# Patient Record
Sex: Female | Born: 1995 | Race: White | Hispanic: No | Marital: Single | Smoking: Former smoker
Health system: Southern US, Community
[De-identification: ages and names within clinical notes are randomized; demographics above are authoritative.]

## PROBLEM LIST (undated history)

## (undated) ENCOUNTER — Ambulatory Visit: Admission: EM | Payer: BC Managed Care – PPO

## (undated) DIAGNOSIS — R45851 Suicidal ideations: Secondary | ICD-10-CM

## (undated) DIAGNOSIS — F4010 Social phobia, unspecified: Secondary | ICD-10-CM

## (undated) DIAGNOSIS — F909 Attention-deficit hyperactivity disorder, unspecified type: Secondary | ICD-10-CM

## (undated) DIAGNOSIS — F329 Major depressive disorder, single episode, unspecified: Secondary | ICD-10-CM

## (undated) DIAGNOSIS — F99 Mental disorder, not otherwise specified: Secondary | ICD-10-CM

## (undated) DIAGNOSIS — F32A Depression, unspecified: Secondary | ICD-10-CM

## (undated) DIAGNOSIS — E66811 Obesity, class 1: Secondary | ICD-10-CM

## (undated) DIAGNOSIS — F419 Anxiety disorder, unspecified: Secondary | ICD-10-CM

## (undated) DIAGNOSIS — F331 Major depressive disorder, recurrent, moderate: Secondary | ICD-10-CM

## (undated) DIAGNOSIS — Z720 Tobacco use: Secondary | ICD-10-CM

---

## 2015-01-26 ENCOUNTER — Emergency Department (HOSPITAL_COMMUNITY)
Admission: EM | Admit: 2015-01-26 | Discharge: 2015-01-26 | Disposition: A | Discharge status: Home / Self Care | Payer: BLUE CROSS/BLUE SHIELD | Source: Home / Self Care | Attending: Family Medicine | Admitting: Family Medicine

## 2015-01-26 ENCOUNTER — Emergency Department (INDEPENDENT_AMBULATORY_CARE_PROVIDER_SITE_OTHER): Payer: BLUE CROSS/BLUE SHIELD

## 2015-01-26 ENCOUNTER — Encounter (HOSPITAL_COMMUNITY): Payer: Self-pay | Admitting: Emergency Medicine

## 2015-01-26 DIAGNOSIS — S93402A Sprain of unspecified ligament of left ankle, initial encounter: Secondary | ICD-10-CM | POA: Diagnosis not present

## 2015-01-26 NOTE — Discharge Instructions (Signed)

## 2015-01-26 NOTE — ED Notes (Signed)
Reports rolling left ankle last night while horse playing.  "states I heard 3 pops".  Unable to bear weight.  Bruising and swelling noted.
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
## 2015-08-20 ENCOUNTER — Emergency Department (HOSPITAL_COMMUNITY)
Admission: EM | Admit: 2015-08-20 | Discharge: 2015-08-20 | Disposition: A | Discharge status: Home / Self Care | Payer: BLUE CROSS/BLUE SHIELD | Attending: Emergency Medicine | Admitting: Emergency Medicine

## 2015-08-20 ENCOUNTER — Encounter (HOSPITAL_COMMUNITY): Payer: Self-pay

## 2015-08-20 DIAGNOSIS — F17210 Nicotine dependence, cigarettes, uncomplicated: Secondary | ICD-10-CM | POA: Insufficient documentation

## 2015-08-20 DIAGNOSIS — Y9389 Activity, other specified: Secondary | ICD-10-CM | POA: Diagnosis not present

## 2015-08-20 DIAGNOSIS — Z79899 Other long term (current) drug therapy: Secondary | ICD-10-CM | POA: Insufficient documentation

## 2015-08-20 DIAGNOSIS — W260XXA Contact with knife, initial encounter: Secondary | ICD-10-CM | POA: Insufficient documentation

## 2015-08-20 DIAGNOSIS — Y998 Other external cause status: Secondary | ICD-10-CM | POA: Insufficient documentation

## 2015-08-20 DIAGNOSIS — S61412A Laceration without foreign body of left hand, initial encounter: Secondary | ICD-10-CM | POA: Insufficient documentation

## 2015-08-20 DIAGNOSIS — Y9289 Other specified places as the place of occurrence of the external cause: Secondary | ICD-10-CM | POA: Diagnosis not present

## 2015-08-20 MED ORDER — BACITRACIN ZINC 500 UNIT/GM EX OINT: 1.0000 "application " | TOPICAL_OINTMENT | Freq: Two times a day (BID) | CUTANEOUS | Status: DC

## 2015-08-20 MED ORDER — LIDOCAINE-EPINEPHRINE (PF) 2 %-1:200000 IJ SOLN: 10.0000 mL | Freq: Once | INTRAMUSCULAR | Status: DC

## 2015-08-20 MED ORDER — IBUPROFEN 600 MG PO TABS: 600.0000 mg | ORAL_TABLET | Freq: Four times a day (QID) | ORAL | Status: DC | PRN
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
## 2015-08-20 NOTE — Discharge Instructions (Signed)

## 2015-08-20 NOTE — ED Notes (Signed)
Pt had a laceration to the palm of her right hand, bleeding controlled at this time

## 2015-10-15 DIAGNOSIS — F900 Attention-deficit hyperactivity disorder, predominantly inattentive type: Secondary | ICD-10-CM

## 2015-11-18 ENCOUNTER — Encounter (HOSPITAL_COMMUNITY): Payer: Self-pay | Admitting: Emergency Medicine

## 2015-11-18 ENCOUNTER — Emergency Department (HOSPITAL_COMMUNITY)
Admission: EM | Admit: 2015-11-18 | Discharge: 2015-11-18 | Payer: BLUE CROSS/BLUE SHIELD | Attending: Emergency Medicine | Admitting: Emergency Medicine

## 2015-11-18 ENCOUNTER — Inpatient Hospital Stay
Admission: EM | Admit: 2015-11-18 | Discharge: 2015-11-21 | DRG: 885 | Disposition: A | Discharge status: Home / Self Care | Payer: BLUE CROSS/BLUE SHIELD | Source: Intra-hospital | Attending: Psychiatry | Admitting: Psychiatry

## 2015-11-18 DIAGNOSIS — F17210 Nicotine dependence, cigarettes, uncomplicated: Secondary | ICD-10-CM | POA: Diagnosis present

## 2015-11-18 DIAGNOSIS — E538 Deficiency of other specified B group vitamins: Secondary | ICD-10-CM

## 2015-11-18 DIAGNOSIS — F322 Major depressive disorder, single episode, severe without psychotic features: Secondary | ICD-10-CM

## 2015-11-18 DIAGNOSIS — Z9109 Other allergy status, other than to drugs and biological substances: Secondary | ICD-10-CM | POA: Diagnosis not present

## 2015-11-18 DIAGNOSIS — Y906 Blood alcohol level of 120-199 mg/100 ml: Secondary | ICD-10-CM | POA: Diagnosis present

## 2015-11-18 DIAGNOSIS — F410 Panic disorder [episodic paroxysmal anxiety] without agoraphobia: Secondary | ICD-10-CM | POA: Diagnosis present

## 2015-11-18 DIAGNOSIS — R457 State of emotional shock and stress, unspecified: Secondary | ICD-10-CM | POA: Diagnosis not present

## 2015-11-18 DIAGNOSIS — Z818 Family history of other mental and behavioral disorders: Secondary | ICD-10-CM | POA: Diagnosis not present

## 2015-11-18 DIAGNOSIS — F4010 Social phobia, unspecified: Secondary | ICD-10-CM

## 2015-11-18 DIAGNOSIS — F1010 Alcohol abuse, uncomplicated: Secondary | ICD-10-CM | POA: Diagnosis present

## 2015-11-18 DIAGNOSIS — Z79899 Other long term (current) drug therapy: Secondary | ICD-10-CM | POA: Insufficient documentation

## 2015-11-18 DIAGNOSIS — Z0289 Encounter for other administrative examinations: Secondary | ICD-10-CM | POA: Insufficient documentation

## 2015-11-18 DIAGNOSIS — Z008 Encounter for other general examination: Secondary | ICD-10-CM

## 2015-11-18 DIAGNOSIS — F1020 Alcohol dependence, uncomplicated: Secondary | ICD-10-CM

## 2015-11-18 DIAGNOSIS — F909 Attention-deficit hyperactivity disorder, unspecified type: Secondary | ICD-10-CM | POA: Diagnosis not present

## 2015-11-18 DIAGNOSIS — F17200 Nicotine dependence, unspecified, uncomplicated: Secondary | ICD-10-CM

## 2015-11-18 DIAGNOSIS — G4700 Insomnia, unspecified: Secondary | ICD-10-CM | POA: Diagnosis present

## 2015-11-18 DIAGNOSIS — Z046 Encounter for general psychiatric examination, requested by authority: Secondary | ICD-10-CM | POA: Diagnosis present

## 2015-11-18 DIAGNOSIS — F1510 Other stimulant abuse, uncomplicated: Secondary | ICD-10-CM | POA: Insufficient documentation

## 2015-11-18 DIAGNOSIS — F331 Major depressive disorder, recurrent, moderate: Principal | ICD-10-CM

## 2015-11-18 DIAGNOSIS — F900 Attention-deficit hyperactivity disorder, predominantly inattentive type: Secondary | ICD-10-CM

## 2015-11-18 HISTORY — DX: Depression, unspecified: F32.A

## 2015-11-18 HISTORY — DX: Attention-deficit hyperactivity disorder, unspecified type: F90.9

## 2015-11-18 HISTORY — DX: Anxiety disorder, unspecified: F41.9

## 2015-11-18 HISTORY — DX: Major depressive disorder, single episode, unspecified: F32.9

## 2015-11-18 LAB — RAPID URINE DRUG SCREEN, HOSP PERFORMED
Amphetamines: POSITIVE — AB
Barbiturates: NOT DETECTED
Benzodiazepines: NOT DETECTED
Cocaine: NOT DETECTED
Opiates: NOT DETECTED
Tetrahydrocannabinol: NOT DETECTED

## 2015-11-18 LAB — COMPREHENSIVE METABOLIC PANEL
ALT: 19 U/L (ref 14–54)
AST: 28 U/L (ref 15–41)
Albumin: 4.9 g/dL (ref 3.5–5.0)
Alkaline Phosphatase: 54 U/L (ref 38–126)
Anion gap: 13 (ref 5–15)
BUN: 13 mg/dL (ref 6–20)
CO2: 22 mmol/L (ref 22–32)
Calcium: 9.3 mg/dL (ref 8.9–10.3)
Chloride: 108 mmol/L (ref 101–111)
Creatinine, Ser: 0.91 mg/dL (ref 0.44–1.00)
GFR calc Af Amer: >60 mL/min (ref >60)
GFR calc non Af Amer: >60 mL/min (ref >60)
Glucose, Bld: 82 mg/dL (ref 65–99)
Potassium: 3.9 mmol/L (ref 3.5–5.1)
Sodium: 143 mmol/L (ref 135–145)
Total Bilirubin: 0.3 mg/dL (ref 0.3–1.2)
Total Protein: 8 g/dL (ref 6.5–8.1)

## 2015-11-18 LAB — CBC
HCT: 46.1 % — ABNORMAL HIGH (ref 36.0–46.0)
Hemoglobin: 15.3 g/dL — ABNORMAL HIGH (ref 12.0–15.0)
MCH: 29.9 pg (ref 26.0–34.0)
MCHC: 33.2 g/dL (ref 30.0–36.0)
MCV: 90 fL (ref 78.0–100.0)
Platelets: 351 10*3/uL (ref 150–400)
RBC: 5.12 MIL/uL — ABNORMAL HIGH (ref 3.87–5.11)
RDW: 14.1 % (ref 11.5–15.5)
WBC: 7.9 10*3/uL (ref 4.0–10.5)

## 2015-11-18 LAB — ACETAMINOPHEN LEVEL: Acetaminophen (Tylenol), Serum: <10 ug/mL — ABNORMAL LOW (ref 10–30)

## 2015-11-18 LAB — PREGNANCY, URINE: Preg Test, Ur: NEGATIVE

## 2015-11-18 LAB — SALICYLATE LEVEL: Salicylate Lvl: <4 mg/dL (ref 2.8–30.0)

## 2015-11-18 LAB — ETHANOL: Alcohol, Ethyl (B): 130 mg/dL — ABNORMAL HIGH (ref <5)

## 2015-11-18 MED ORDER — NICOTINE 21 MG/24HR TD PT24: 21.0000 mg | MEDICATED_PATCH | Freq: Every day | TRANSDERMAL | Status: DC | PRN

## 2015-11-18 MED ORDER — ONDANSETRON HCL 4 MG PO TABS: 4.0000 mg | ORAL_TABLET | Freq: Three times a day (TID) | ORAL | Status: DC | PRN

## 2015-11-18 MED ORDER — ZOLPIDEM TARTRATE 5 MG PO TABS: 5.0000 mg | ORAL_TABLET | Freq: Every evening | ORAL | Status: DC | PRN

## 2015-11-18 MED ORDER — HYDROXYZINE HCL 25 MG PO TABS: 25.0000 mg | ORAL_TABLET | Freq: Four times a day (QID) | ORAL | Status: DC | PRN

## 2015-11-18 MED ORDER — ACETAMINOPHEN 325 MG PO TABS
650.0000 mg | ORAL_TABLET | Freq: Four times a day (QID) | ORAL | Status: DC | PRN
  Administered 2015-11-20 (×2): 650 mg via ORAL
  Filled 2015-11-18 (×2): qty 2

## 2015-11-18 MED ORDER — TRAZODONE HCL 100 MG PO TABS: 100.0000 mg | ORAL_TABLET | Freq: Every evening | ORAL | Status: DC | PRN

## 2015-11-18 MED ORDER — LOPERAMIDE HCL 2 MG PO CAPS: 2.0000 mg | ORAL_CAPSULE | ORAL | Status: DC | PRN

## 2015-11-18 MED ORDER — THIAMINE HCL 100 MG/ML IJ SOLN: 100.0000 mg | Freq: Once | INTRAMUSCULAR | Status: DC

## 2015-11-18 MED ORDER — HYDROXYZINE HCL 25 MG PO TABS
25.0000 mg | ORAL_TABLET | Freq: Three times a day (TID) | ORAL | Status: DC | PRN
  Administered 2015-11-18 (×2): 25 mg via ORAL
  Filled 2015-11-18 (×2): qty 1

## 2015-11-18 MED ORDER — ADULT MULTIVITAMIN W/MINERALS CH
1.0000 | ORAL_TABLET | Freq: Every day | ORAL | Status: DC
  Administered 2015-11-18: 1 via ORAL
  Filled 2015-11-18: qty 1

## 2015-11-18 MED ORDER — IBUPROFEN 200 MG PO TABS: 600.0000 mg | ORAL_TABLET | Freq: Three times a day (TID) | ORAL | Status: DC | PRN

## 2015-11-18 MED ORDER — ONDANSETRON 4 MG PO TBDP: 4.0000 mg | ORAL_TABLET | Freq: Four times a day (QID) | ORAL | Status: DC | PRN

## 2015-11-18 MED ORDER — LORAZEPAM 1 MG PO TABS: 1.0000 mg | ORAL_TABLET | Freq: Four times a day (QID) | ORAL | Status: DC | PRN

## 2015-11-18 MED ORDER — ALUM & MAG HYDROXIDE-SIMETH 200-200-20 MG/5ML PO SUSP: 30.0000 mL | ORAL | Status: DC | PRN

## 2015-11-18 MED ORDER — TRAZODONE HCL 50 MG PO TABS: 50.0000 mg | ORAL_TABLET | Freq: Every day | ORAL | Status: DC

## 2015-11-18 MED ORDER — LORAZEPAM 1 MG PO TABS: 0.0000 mg | ORAL_TABLET | Freq: Two times a day (BID) | ORAL | Status: DC

## 2015-11-18 MED ORDER — MAGNESIUM HYDROXIDE 400 MG/5ML PO SUSP: 30.0000 mL | Freq: Every day | ORAL | Status: DC | PRN

## 2015-11-18 MED ORDER — VITAMIN B-1 100 MG PO TABS: 100.0000 mg | ORAL_TABLET | Freq: Every day | ORAL | Status: DC

## 2015-11-18 MED ORDER — LORAZEPAM 1 MG PO TABS: 1.0000 mg | ORAL_TABLET | Freq: Two times a day (BID) | ORAL | Status: DC

## 2015-11-18 MED ORDER — LORAZEPAM 1 MG PO TABS: 1.0000 mg | ORAL_TABLET | Freq: Every day | ORAL | Status: DC

## 2015-11-18 MED ORDER — ACETAMINOPHEN 325 MG PO TABS: 650.0000 mg | ORAL_TABLET | ORAL | Status: DC | PRN

## 2015-11-18 MED ORDER — LORAZEPAM 1 MG PO TABS: 0.0000 mg | ORAL_TABLET | Freq: Four times a day (QID) | ORAL | Status: DC

## 2015-11-18 MED ORDER — LORAZEPAM 1 MG PO TABS: 1.0000 mg | ORAL_TABLET | Freq: Three times a day (TID) | ORAL | Status: DC

## 2015-11-18 MED ORDER — LORAZEPAM 1 MG PO TABS
1.0000 mg | ORAL_TABLET | Freq: Three times a day (TID) | ORAL | Status: DC | PRN
  Administered 2015-11-18: 1 mg via ORAL
  Filled 2015-11-18: qty 1

## 2015-11-18 MED ORDER — LORAZEPAM 1 MG PO TABS: 1.0000 mg | ORAL_TABLET | Freq: Four times a day (QID) | ORAL | Status: DC

## 2015-11-18 NOTE — ED Notes (Signed)
Pt has in belonging bag:  Purple hoodie zip up sweater, brown hair band, black pants, black bra, white socks, white shirt, black undershirt, black sweater, brown boots, white phone, (1) ten dollar bill.

## 2015-11-18 NOTE — Progress Notes (Signed)
D:  Patient is a 20 year-old female admitted to [facility] ambulatory without difficulty.  Patient is alert and oriented upon admission.  Patient is tearful on admission interview and reports being very anxious. A:  Admission assessment completed without difficulty.  Skin and contraband assessment completed with no skin abnormalities nor contraband found.  Q.15 minute safety checks were implemented at the time of admission.  Patient was oriented to the unit and escorted to room #324-B. R:  Patient was receptive to and cooperative with admission assessment.  Patient contracts for safety on the unit at this time

## 2015-11-18 NOTE — BH Assessment (Signed)
[facility] Assessment Progress Note  Per Thedore MinsMojeed Akintayo, MD, this pt requires psychiatric hospitalization at this time.  Berneice Heinrichina Tate, RN, [facility] calls to report that pt has been accepted to [facility], Rm 311, by Dr Ardyth HarpsHernandez.  Dahlia ByesJosephine Onuoha, NP, concurs with this decision.  Pt has signed Voluntary Admission and Consent for Treatment, as well as Consent to Release Information to no one, and signed [facility].  Pt's nurse, Morrie Sheldonshley, has been notified, and agrees to send original paperwork along with pt via [facility], and to call report to [phone].  Doylene Canninghomas Walaa Carel, MA Triage Specialist [phone]
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
## 2015-11-18 NOTE — [facility] Group Notes (Signed)
[facility] LCSW Group Therapy   11/18/2015 1pm  Type of Therapy: Group Therapy   Participation Level: Did Not Attend. Patient invited to participate but declined.    Vernice Bowker F. Ashea Winiarski, MSW, LCSWA, LCAS   
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
## 2015-11-18 NOTE — ED Notes (Signed)
[facility] at facility to transfer pt to [facility]. Pt signed for personal belongings. Personal belongings given to [facility] for [facility]. Pt signed e-signature. Ambulatory out of facility.

## 2015-11-18 NOTE — Progress Notes (Signed)
Entered in d/c instructions Nicolasa DuckingGarlick, William Schedule an appointment as soon as possible for a visit As needed [address] [place] [id number] [phone]

## 2015-11-18 NOTE — ED Notes (Signed)
Patient tearful, states she had a hard day based on "some difficult days that are coming up". Patient states she does not really want to stay but will because she does not want to be involuntarily committed. Patient denies SI. Patient reports she has a psychiatrist named Alexia Freestoneatty that she "sometimes sees".
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
## 2015-11-18 NOTE — ED Notes (Signed)
Pt requesting to speak with MD. Pt boyfriend at bedside. MD notified. Will continue to monitor.
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
## 2015-11-18 NOTE — Progress Notes (Signed)
Pt states pcp is Dr Heriberto AntiguaGarlick EPIC updated

## 2015-11-19 DIAGNOSIS — F331 Major depressive disorder, recurrent, moderate: Secondary | ICD-10-CM

## 2015-11-19 DIAGNOSIS — F1010 Alcohol abuse, uncomplicated: Secondary | ICD-10-CM

## 2015-11-19 DIAGNOSIS — F909 Attention-deficit hyperactivity disorder, unspecified type: Secondary | ICD-10-CM

## 2015-11-19 DIAGNOSIS — F4010 Social phobia, unspecified: Secondary | ICD-10-CM

## 2015-11-19 DIAGNOSIS — F17200 Nicotine dependence, unspecified, uncomplicated: Secondary | ICD-10-CM

## 2015-11-19 LAB — TSH: TSH: 1.054 u[IU]/mL (ref 0.350–4.500)

## 2015-11-19 MED ORDER — NICOTINE 14 MG/24HR TD PT24
14.0000 mg | MEDICATED_PATCH | Freq: Every day | TRANSDERMAL | Status: DC | PRN
  Administered 2015-11-19 – 2015-11-20 (×2): 14 mg via TRANSDERMAL
  Filled 2015-11-19 (×2): qty 1

## 2015-11-19 MED ORDER — FLUOXETINE HCL 10 MG PO CAPS
10.0000 mg | ORAL_CAPSULE | Freq: Every day | ORAL | Status: DC
  Administered 2015-11-19 – 2015-11-21 (×3): 10 mg via ORAL
  Filled 2015-11-19 (×3): qty 1

## 2015-11-19 MED ORDER — CLONAZEPAM 0.5 MG PO TABS
0.2500 mg | ORAL_TABLET | Freq: Three times a day (TID) | ORAL | Status: DC
  Administered 2015-11-19 – 2015-11-21 (×6): 0.25 mg via ORAL
  Filled 2015-11-19 (×6): qty 1

## 2015-11-19 MED ORDER — HYDROXYZINE HCL 25 MG PO TABS
25.0000 mg | ORAL_TABLET | Freq: Every evening | ORAL | Status: DC | PRN
  Administered 2015-11-19 – 2015-11-20 (×3): 25 mg via ORAL
  Filled 2015-11-19 (×3): qty 1

## 2015-11-19 NOTE — [facility] Group Notes (Signed)
[facility] LCSW Group Therapy   11/19/2015 1:15 PM   Type of Therapy: Group Therapy   Participation Level: Active   Participation Quality: Attentive, Sharing and Supportive   Affect: Depressed and Flat   Cognitive: Alert and Oriented   Insight: Developing/Improving and Engaged   Engagement in Therapy: Developing/Improving and Engaged   Modes of Intervention: Clarification, Confrontation, Discussion, Education, Exploration, Limit-setting, Orientation, Problem-solving, Rapport Building, Dance movement psychotherapisteality Testing, Socialization and Support   Summary of Progress/Problems: The topic for group today was emotional regulation. This group focused on both positive and negative emotion identification and allowed group members to process ways to identify feelings, regulate negative emotions, and find healthy ways to manage internal/external emotions. Group members were asked to reflect on a time when their reaction to an emotion led to a negative outcome and explored how alternative responses using emotion regulation would have benefited them. Group members were also asked to discuss a time when emotion regulation was utilized when a negative emotion was experienced. Pt shared she once reacted to an unpleasant incident by demonstrating behavior that was disturbing.  Pt was a vague historian.  Pt shared that she identified that this was a negative outcome that resulted from a negative emotion.  Pt shared she would, in the future, seek out therapeutic relationships determine her next course of action after experiencing a negative emotion like depression.  Pt was polite and cooperative with the CSW and other group members and focused and attentive to the topics discussed and the sharing of others.      Dorothe PeaJonathan F. Lia Vigilante, MSW, LCSWA, LCAS

## 2015-11-19 NOTE — [facility] Group Notes (Signed)
[facility] Group Notes:  (Nursing/MHT/Case Management/Adjunct)  Date:  11/19/2015  Time:  10:38 PM  Type of Therapy:  Group Therapy  Participation Level:  Active  Participation Quality:  Appropriate  Affect:  Appropriate  Cognitive:  Appropriate  Insight:  Appropriate  Engagement in Group:  Engaged  Modes of Intervention:  n/a  Summary of Progress/Problems:  Renee Mcdowell Renee Mcdowell 11/19/2015, 10:38 PM

## 2015-11-19 NOTE — Progress Notes (Signed)
Pt pleasant and cooperative with care. Denies SI, HI, AVH. No negative behaviors. Visible in mileu. Interacts appropriately with staff and peers.  Encouragement and support offered. Pt receptive and remains safe on unit.  Will continue to assess and monitor for q 15 min checks

## 2015-11-19 NOTE — Progress Notes (Signed)
D: Pt is pleasant and cooperative this evening. Denies SI/HI/AVH at this time. Denies pain. Pt c/o anxiety which she rates an 8 out of 10, due to "being here." Pt requests PRN medication.  A: Emotional support and encouragement provided. Medications administered with education. q15 minute safety checks maintained. R: Pt remains free from harm. Will continue to monitor.

## 2015-11-19 NOTE — [facility] Group Notes (Signed)
[facility] Group Notes:  (Nursing/MHT/Case Management/Adjunct)  Date:  11/19/2015  Time:  3:39 PM  Type of Therapy:  Psychoeducational Skills  Participation Level:  Did Not Attend   Lynelle SmokeCara [facility] LPMadoni 11/19/2015, 3:39 PM
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
## 2015-11-19 NOTE — Plan of Care (Signed)
Problem: Ineffective individual coping Goal: STG: Patient will remain free from self harm Outcome: Progressing Pt remains free from harm  Problem: Alteration in mood Goal: LTG-Patient reports reduction in suicidal thoughts (Patient reports reduction in suicidal thoughts and is able to verbalize a safety plan for whenever patient is feeling suicidal)  Outcome: Progressing Pt denies SI at this time     

## 2015-11-19 NOTE — Progress Notes (Signed)
Recreation Therapy Notes  Date: 03.15.17 Time: 3:00 pm Location: Craft Room  Group Topic: Self-esteem  Goal Area(s) Addresses:  Patient will write at least one positive trait. Patient will verbalize benefit of having a healthy self-esteem.  Behavioral Response: Attentive, Interactive  Intervention: I Am  Activity: Patients were given a worksheet with the letter I on it and instructed to write as many positive traits inside the letter.  Education: LRT educated patients on ways they can increase their self-esteem.  Education Outcome: Acknowledges education/In group clarification offered  Clinical Observations/Feedback: Patient completed activity by writing positive traits down. Patient contributed to group discussion by stating how her self-esteem affects her and why she tends to think more negatively about herself instead of positively.  Jacquelynn CreeGreene,Mikaila Grunert M, LRT/CTRS 11/19/2015 4:51 PM
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
## 2015-11-19 NOTE — Plan of Care (Signed)
Problem: Ineffective individual coping Goal: STG: Patient will remain free from self harm Outcome: Progressing No self harm reported or observed     

## 2015-11-19 NOTE — [facility] Suicide Risk Assessment (Signed)
[facility]   Nursing information obtained from:    Demographic factors:    Current Mental Status:    Loss Factors:    Historical Factors:    Risk Reduction Factors:     Total Time spent with patient: 1 hour Principal Problem: Major depressive disorder, recurrent episode, moderate (HCC) Diagnosis:   Patient Active Problem List   Diagnosis Date Noted  . Social phobia [F40.10] 11/19/2015  . Tobacco use disorder [F17.200] 11/19/2015  . Major depressive disorder, recurrent episode, moderate (HCC) [F33.1] 11/19/2015  . Alcohol use disorder, mild, abuse [F10.10] 11/19/2015  . Attention deficit hyperactivity disorder (ADHD) [F90.9] 11/19/2015   Subjective Data:   Continued Clinical Symptoms:  Alcohol Use Disorder Identification Test Final Score (AUDIT): 4 The "Alcohol Use Disorders Identification Test", Guidelines for Use in Primary Care, Second Edition.  World Science writerHealth Organization [facility](WHO). Score between 0-7:  no or low risk or alcohol related problems. Score between 8-15:  moderate risk of alcohol related problems. Score between 16-19:  high risk of alcohol related problems. Score 20 or above:  warrants further diagnostic evaluation for alcohol dependence and treatment.   CLINICAL FACTORS:       Psychiatric Specialty Exam: ROS   COGNITIVE FEATURES THAT CONTRIBUTE TO RISK:  None    SUICIDE RISK:   Mild:  Suicidal ideation of limited frequency, intensity, duration, and specificity.  There are no identifiable plans, no associated intent, mild dysphoria and related symptoms, good self-control (both objective and subjective assessment), few other risk factors, and identifiable protective factors, including available and accessible social support.  [facility]  I certify that inpatient services furnished can reasonably be expected to improve the patient's condition.   Jimmy FootmanHernandez-Gonzalez,  Brinnley Lacap, MD 11/19/2015, 12:33 PM

## 2015-11-19 NOTE — [facility] Group Notes (Signed)
[facility] LCSW Aftercare Discharge Planning Group Note  11/19/2015 9:30 AM  Participation Quality: Did Not Attend. Patient invited to participate but declined.   Renee Mcdowell, MSW, LCSWA, LCAS   

## 2015-11-20 LAB — VITAMIN B12: Vitamin B-12: 198 pg/mL (ref 180–914)

## 2015-11-20 MED ORDER — CLONAZEPAM 0.5 MG PO TABS: 0.2500 mg | ORAL_TABLET | Freq: Three times a day (TID) | ORAL | Status: DC | PRN

## 2015-11-20 MED ORDER — HYDROXYZINE HCL 25 MG PO TABS: 25.0000 mg | ORAL_TABLET | Freq: Every evening | ORAL | Status: DC | PRN

## 2015-11-20 MED ORDER — FLUOXETINE HCL 10 MG PO CAPS: 10.0000 mg | ORAL_CAPSULE | Freq: Every day | ORAL | Status: DC

## 2015-11-20 NOTE — Progress Notes (Signed)
D: Pt denies SI/HI/AVH. Pt is pleasant and cooperative. Pt visible on unit . Pt stated she was feeling ok at this time, forwards little information, but will answer questions if prompted.   A: Pt was offered support and encouragement. Pt was given scheduled medications. Pt was encourage to attend groups. Q 15 minute checks were done for safety.   R:Pt attends groups and interacts well with peers and staff. Pt is taking medication. Pt has no complaints.Pt receptive to treatment and safety maintained on unit.

## 2015-11-20 NOTE — Progress Notes (Signed)
[facility]  11/20/2015 1:44 PM Deforest Hoyleslyse Fike  MRN:  [id number] Subjective: Patient reports feeling much better this morning. She no longer feels suicidal or overwhelmed with emotions. She feels that her emotions are more under control and she doesn't feel as nervous. She was able to sleep well through the night. She is tolerating medications well. She denies major problems with sleep, appetite energy or concentration today. She denies suicidality, homicidality or having auditory or visual hallucinations. She denies side effects from medications. She denies physical complaints today.  We discussed our concerns about alcohol use and driving all intoxicated. Patient was receptive and acknowledges drinking excessively and that she should not have been driving that night  Per nursing:   D: Patient is alert and oriented on the unit this shift. Patient attended and actively participated in groups today. Patient denies suicidal ideation, homicidal ideation, auditory or visual hallucinations at the present time.  A: Scheduled medications are administered to patient as per MD orders. Emotional support and encouragement are provided. Patient is maintained on q.15 minute safety checks. Patient is informed to notify staff with questions or concerns. R: No adverse medication reactions are noted. Patient is cooperative with medication administration and treatment plan today. Patient is receptive, calm and cooperative on the unit at this time. Patient interacts well with others on the unit this shift. Patient contracts for safety at this time. Patient remains safe at this time.  Principal Problem: Major depressive disorder, recurrent episode, moderate (HCC) Diagnosis:   Patient Active Problem List   Diagnosis Date Noted  . Social phobia [F40.10] 11/19/2015  . Tobacco use disorder [F17.200] 11/19/2015  . Major depressive disorder, recurrent episode, moderate (HCC) [F33.1] 11/19/2015  .  Alcohol use disorder, mild, abuse [F10.10] 11/19/2015  . Attention deficit hyperactivity disorder (ADHD) [F90.9] 11/19/2015   Total Time spent with patient: 30 minutes    Past Medical History:  Past Medical History  Diagnosis Date  . ADHD (attention deficit hyperactivity disorder)   . Depression   . Anxiety    History reviewed. No pertinent past surgical history.  Family History:  Family History  Problem Relation Age of Onset  . Mental illness Father     Schizoaffective disorder   Social History:  History  Alcohol Use  . Yes     History  Drug Use No    Social History   Social History  . Marital Status: Single    Spouse Name: N/A  . Number of Children: N/A  . Years of Education: N/A   Social History Main Topics  . Smoking status: Current Every Day Smoker -- 0.25 packs/day    Types: Cigarettes  . Smokeless tobacco: None  . Alcohol Use: Yes  . Drug Use: No  . Sexual Activity: Yes    Birth Control/ Protection: Implant   Other Topics Concern  . None   Social History Narrative     Current Medications: Current Facility-Administered Medications  Medication Dose Route Frequency Provider Last Rate Last Dose  . acetaminophen (TYLENOL) tablet 650 mg  650 mg Oral Q6H PRN Jimmy FootmanAndrea Hernandez-Gonzalez, MD   650 mg at 11/20/15 0828  . alum & mag hydroxide-simeth (MAALOX/MYLANTA) 200-200-20 MG/5ML suspension 30 mL  30 mL Oral Q4H PRN Jimmy FootmanAndrea Hernandez-Gonzalez, MD      . clonazePAM Scarlette Calico(KLONOPIN) tablet 0.25 mg  0.25 mg Oral TID Jimmy FootmanAndrea Hernandez-Gonzalez, MD   0.25 mg at 11/20/15 0829  . FLUoxetine (PROZAC) capsule 10 mg  10 mg Oral Daily Jimmy FootmanAndrea Hernandez-Gonzalez,  MD   10 mg at 11/20/15 0828  . hydrOXYzine (ATARAX/VISTARIL) tablet 25 mg  25 mg Oral QHS,MR X 1 Jimmy Footman, MD   25 mg at 11/19/15 2205  . magnesium hydroxide (MILK OF MAGNESIA) suspension 30 mL  30 mL Oral Daily PRN Jimmy Footman, MD      . nicotine (NICODERM CQ - dosed in mg/24 hours)  patch 14 mg  14 mg Transdermal Daily PRN Jimmy Footman, MD   14 mg at 11/20/15 1203    Lab Results:  Results for orders placed or performed during the hospital encounter of 11/18/15 (from the past 48 hour(s))  TSH     Status: None   Collection Time: 11/19/15  4:56 PM  Result Value Ref Range   TSH 1.054 0.350 - 4.500 uIU/mL    Blood Alcohol level:  Lab Results  Component Value Date   ETH 130* 11/18/2015    Physical Findings: AIMS:  , ,  ,  ,    CIWA:    COWS:     Musculoskeletal: Strength & Muscle Tone: within normal limits Gait & Station: normal Patient leans: N/A  Psychiatric Specialty Exam: Review of Systems  Constitutional: Negative.   HENT: Negative.   Eyes: Negative.   Respiratory: Negative.   Cardiovascular: Negative.   Gastrointestinal: Negative.   Genitourinary: Negative.   Musculoskeletal: Negative.   Skin: Negative.   Neurological: Negative.   Endo/Heme/Allergies: Negative.   Psychiatric/Behavioral: Negative.     Blood pressure 118/76, pulse 77, temperature 98.6 F (37 C), temperature source Oral, resp. rate 20, height  (1.626 m), weight 61.236 kg (135 lb), last menstrual period 11/11/2015, SpO2 99 %.Body mass index is 23.16 kg/(m^2).  General Appearance: Fairly Groomed  Patent attorney::  Good  Speech:  Clear and Coherent  Volume:  Normal  Mood:  Anxious  Affect:  Appropriate  Thought Process:  Linear  Orientation:  Full (Time, Place, and Person)  Thought Content:  Hallucinations: None  Suicidal Thoughts:  No  Homicidal Thoughts:  No  Memory:  Immediate;   Good Recent;   Good Remote;   Good  Judgement:  Good  Insight:  Fair  Psychomotor Activity:  Normal  Concentration:  Good  Recall:  Good  Fund of Knowledge:Good  Language: Good  Akathisia:  No  Handed:    AIMS (if indicated):     Assets:  Manufacturing systems engineer Physical Health Social Support  ADL's:  Intact  Cognition: WNL  Sleep:  Number of Hours: 7.15   Treatment  Plan Summary:  Major depressive disorder: Patient will be continued on fluoxetine 10 mg by mouth daily  Social phobia: Patient will be referred to individual psychotherapy upon discharge. Fluoxetine will help with symptoms of social phobia  Agitation/anxiety: continue klonopin 0.25 mg tid  Insomnia: continue vistaril 25 mg qhs  Tobacco use disorder: continue nicotine patch 14 mg a day  Alcohol abuse: Patient will receive education about the negative effects of alcohol in mood and anxiety. We will refer for therapy upon discharge  ADHD: Patient has been stable on Adderall in the community. Patient is to restart this treatment upon discharge.  Precautions every 15 minute checks  Diet regular  Discharge disposition will return home with stable  Discharge follow-up the patient will be scheduled to follow with a psychiatrist and a therapist.  Labs: TSH wnl, B12 pending  Plan to d/c home tomorrow  Jimmy Footman, MD 11/20/2015, 1:44 PM
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
## 2015-11-20 NOTE — Plan of Care (Signed)
Problem: Ineffective individual coping Goal: LTG: Patient will report a decrease in negative feelings Outcome: Progressing Patient reports feeling better today than she did yesterday or the day before

## 2015-11-20 NOTE — Plan of Care (Signed)
Problem: [facility] Participation in Recreation Therapeutic Interventions Goal: STG-Patient will demonstrate improved self esteem by identif STG: Self-Esteem - Within 4 treatment sessions, patient will verbalize at least 5 positive affirmation statements in each of 2 treatment sessions to increase self-esteem post d/c.  Outcome: Progressing Treatment Session 1; Completed 1 out of 2: At approximately 1:50 pm, LRT met with patient in consultation room. Patient verbalized 5 positive affirmation statements. Patient reported it felt "good" and "true". LRT encouraged patient to continue saying positive affirmation statements. Intervention Used: I Am statements  Leonette Monarch, LRT/CTRS 03.16.17 2:23 pm Goal: STG-Other Recreation Therapy Goal (Specify) STG: Stress Management - Within 4 treatment sessions, patient will verbalize understanding of the stress management techniques in each of 2 treatment sessions to increase stress management skills post d/c.  Outcome: Progressing Treatment Session 1; Completed 1 out of 2: At approximately 1:50 pm, LRT met with patient in consultation room. LRT educated and provided patient with handouts on stress management techniques. Patient verbalized understanding. LRT encouraged patient to read over and practice the stress management techniques. Intervention Used: Stress Management handouts  Leonette Monarch, LRT/CTRS 03.16.17 2:24 pm

## 2015-11-20 NOTE — Progress Notes (Signed)
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
## 2015-11-20 NOTE — Progress Notes (Signed)
Recreation Therapy Notes  Date: 03.16.17 Time: 3:00 pm Location: Craft Room  Group Topic: Leisure Education  Goal Area(s) Addresses:  Patient will identify activities for each letter of the alphabet. Patient will verbalize ability to integrate positive leisure into life post d/c. Patient will verbalize ability to use leisure as a Associate Professorcoping skill.  Behavioral Response: Attentive, Interactive  Intervention: Leisure Alphabet  Activity: Patients were given a Leisure Information systems managerAlphabet worksheet and instructed to list a healthy leisure activity for each letter of the alphabet.  Education: LRT educated patients on what is needed to participate in leisure.  Education Outcome: Acknowledges education/In group clarification offered  Clinical Observations/Feedback: Patient completed activity by listing healthy leisure activities. Patient contributed to group discussion by stating healthy leisure activities, and how she can integrate leisure back into her schedule.  Jacquelynn CreeGreene,Terry Bolotin M, LRT/CTRS 11/20/2015 4:25 PM

## 2015-11-20 NOTE — Plan of Care (Signed)
Problem: Alteration in mood Goal: LTG-Patient reports reduction in suicidal thoughts (Patient reports reduction in suicidal thoughts and is able to verbalize a safety plan for whenever patient is feeling suicidal)  Outcome: Progressing Patient reports having no suicidal ideation or self harm thoughts currently

## 2015-11-20 NOTE — [facility] Group Notes (Signed)
[facility] Group Notes:  (Nursing/MHT/Case Management/Adjunct)  Date:  11/20/2015  Time:  2:15 PM  Type of Therapy:  Group Therapy  Participation Level:  Active  Participation Quality:  Appropriate, Attentive, Sharing and Supportive  Affect:  Appropriate  Cognitive:  Alert, Appropriate and Oriented  Insight:  Appropriate  Engagement in Group:  Engaged  Modes of Intervention:  Activity and Discussion  Summary of Progress/Problems:  Renee Mcdowell 11/20/2015, 2:15 PM

## 2015-11-20 NOTE — [facility] Suicide Risk Assessment (Addendum)
[facility] INPATIENT:  Family/Significant Other Suicide Prevention Education  Suicide Prevention Education:  Education Completed; with the pt's boyfriend Casimiro NeedleMichael at ph: [phone],  (name of family member/significant other) has been identified by the patient as the family member/significant other with whom the patient will be residing, and identified as the person(s) who will aid the patient in the event of a mental health crisis (suicidal ideations/suicide attempt).  With written consent from the patient, the family member/significant other has been provided the following suicide prevention education, prior to the and/or following the discharge of the patient.  CSW completed SPE with the pt.  The suicide prevention education provided includes the following:  Suicide risk factors  Suicide prevention and interventions  National Suicide Hotline telephone number  [facility] assessment telephone number  [facility] Emergency Assistance 911  [facility] and/or [facility]idential [facility] Crisis [facility] telephone number  Request made of family/significant other to:  Remove weapons (e.g., guns, rifles, knives), all items previously/currently identified as safety concern.    Remove drugs/medications (over-the-counter, prescriptions, illicit drugs), all items previously/currently identified as a safety concern.  The family member/significant other verbalizes understanding of the suicide prevention education information provided.  The family member/significant other agrees to remove the items of safety concern listed above.  Dorothe PeaJonathan F Allex Lapoint 11/20/2015, 2:01 PM

## 2015-11-20 NOTE — Plan of Care (Signed)
Problem: Alteration in mood Goal: LTG-Patient reports reduction in suicidal thoughts (Patient reports reduction in suicidal thoughts and is able to verbalize a safety plan for whenever patient is feeling suicidal)  Outcome: Progressing Pt denies Si at this time     

## 2015-11-20 NOTE — [facility] Group Notes (Signed)
[facility] LCSW Group Therapy   11/20/2015 11am   Type of Therapy: Group Therapy   Participation Level: Active   Participation Quality: Attentive, Sharing and Supportive   Affect: Appropriate   Cognitive: Alert and Oriented   Insight: Developing/Improving and Engaged   Engagement in Therapy: Developing/Improving and Engaged   Modes of Intervention: Clarification, Confrontation, Discussion, Education, Exploration, Limit-setting, Orientation, Problem-solving, Rapport Building, Dance movement psychotherapisteality Testing, Socialization and Support   Summary of Progress/Problems: The topic for group was balance in life. Today's group focused on defining balance in one's own words, identifying things that can knock one off balance, and exploring healthy ways to maintain balance in life. Group members were asked to provide an example of a time when they felt off balance, describe how they handled that situation, and process healthier ways to regain balance in the future. Group members were asked to share the most important tool for maintaining balance that they [facility] and how they plan to apply this method after discharge.  Pt shared that in the past she has not felt the need to to discuss her past issues as much and that she feels this led to her to being able to achieve balance in his life.   Pt shared her most important tool in finding balance is talking to others.  Pt shared she seeks help from her boyfriend when she needs assistance with balance in his life.  Pt shared that she has slept well due to her medications and that he feels that this has made a huge difference in how he feels.  Pt shared she feels better mentally and physically due to adequate rest and medications.  Pt was very agreeable with the groups view of the topics discussed and continually shared her own view points on regaining balance.  The CSW actively listened to the pt, validated her opinions on how to regain balance in life and provided feedback.   Pt demonstrated immense progress from earlier sessions as evidenced by the pt's presenting as happier and more relaxed.

## 2015-11-20 NOTE — [facility] Group Notes (Signed)
[facility] Group Notes:  (Nursing/MHT/Case Management/Adjunct)  Date:  11/20/2015  Time:  9:31 AM  Type of Therapy:  Community Meeting   Participation Level:  Active  Participation Quality:  Appropriate, Attentive and Sharing  Affect:  Appropriate  Cognitive:  Alert, Appropriate and Oriented  Insight:  Appropriate  Engagement in Group:  Engaged  Modes of Intervention:  Discussion and Education  Summary of Progress/Problems:  Renee Mcdowell Renee Mcdowell 11/20/2015, 9:31 AM

## 2015-11-21 DIAGNOSIS — E538 Deficiency of other specified B group vitamins: Secondary | ICD-10-CM

## 2015-11-21 MED ORDER — CYANOCOBALAMIN 1000 MCG/ML IJ SOLN
1000.0000 ug | Freq: Once | INTRAMUSCULAR | Status: AC
  Administered 2015-11-21: 1000 ug via SUBCUTANEOUS
  Filled 2015-11-21: qty 1

## 2015-11-21 MED ORDER — CYANOCOBALAMIN 1000 MCG/ML IJ SOLN: 1000.0000 ug | Freq: Once | INTRAMUSCULAR | Status: DC

## 2015-11-21 MED ORDER — CYANOCOBALAMIN 1000 MCG/ML IJ SOLN: 1000.0000 ug | INTRAMUSCULAR | Status: DC
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
## 2015-11-21 NOTE — Progress Notes (Signed)
Pleasant and cooperative.  Denies SI/HI/AVH.   Discharge instructions given, verbalized understanding.  Prescriptions given and personal belongings returned.  Escorted off unit by this Clinical research associatewriter to meet boyfriend to travel home.

## 2015-11-21 NOTE — Progress Notes (Signed)
D: Pt denies SI/AVH. Pleasant and bright. Medication compliant. Denies pain and voices no additional concerns. Appropriate interaction with staff and peers. A:Encouragement and support provided. Q15 checks maintained for safety. Medication given as prescribed. R: Remains safe on unit. Pleasant and cooperative. Med compliant. Will continue to monitor.

## 2015-11-21 NOTE — Progress Notes (Signed)
Recreation Therapy Notes  INPATIENT RECREATION TR PLAN  Patient Details Name: Renee Mcdowell MRN: [id number] DOB: March 06, 1996 Today's Date: 11/21/2015  Rec Therapy Plan Is patient appropriate for Therapeutic Recreation?: Yes Treatment times per week: At least once a week TR Treatment/Interventions: 1:1 session, Group participation (Comment) (At least once a week)  Discharge Criteria Pt will be discharged from therapy if:: Treatment goals are met, Discharged Treatment plan/goals/alternatives discussed and agreed upon by:: Patient/family  Discharge Summary Short term goals set: See Care Plan Short term goals met: Adequate for discharge Progress toward goals comments: One-to-one attended Which groups?: Self-esteem, Leisure education One-to-one attended: Self-esteem, stress management Reason goals not met: Patient d/c before goal could be met Therapeutic equipment acquired: None Reason patient discharged from therapy: Discharge from hospital Pt/family agrees with progress & goals achieved: Yes Date patient discharged from therapy: 11/21/15   Leonette Monarch, LRT/CTRS 11/21/2015, 1:41 PM

## 2015-11-21 NOTE — Progress Notes (Addendum)
  [facility] Adult Case Management Discharge Plan :  Will you be returning to the same living situation after discharge:  Yes,  home with family At discharge, do you have transportation home?: Yes,  patient's boyfriend will pick up Do you have the ability to pay for your medications: Yes,  patient has [employer] insurance  Release of information consent forms completed and in the chart;  Patient's signature needed at discharge.  Patient to Follow up at: Follow-up Information    Follow up with Nicolasa DuckingGARLICK,WILLIAM, MD On 11/27/2015.   Specialty:  Family Medicine   Why:  f/u on Thursday March 23 at 2: 45 pm   Contact information:   [address] [place] [address] [phone]       Follow up with [facility]. Go on 12/16/2015.   Why:  For follow-up care appt on Tuesday 12/16/15 at 4pm which is earliest available appointment.   Contact information:   [address] [place], [place] [address] Ph [phone] Fax [phone]       Next level of care provider has access to [facility] Link:no  Safety Planning and Suicide Prevention discussed: Yes,  SPE discussed with patient and her boyfriend Kathlene NovemberMike [phone]  Have you used any form of tobacco in the last 30 days? (Cigarettes, Smokeless Tobacco, Cigars, and/or Pipes): Yes  Has patient been referred to the Quitline?: Yes, faxed on 11/20/15  Patient has been referred for addiction treatment: Yes, [facility]  Beryl Meagerngle, Toniqua Melamed T, MSW, LCSWA 11/21/2015, 11:00 AM [phone]

## 2015-12-10 ENCOUNTER — Ambulatory Visit (HOSPITAL_COMMUNITY)
Admission: EM | Admit: 2015-12-10 | Discharge: 2015-12-10 | Disposition: A | Discharge status: Home / Self Care | Payer: No Typology Code available for payment source | Source: Ambulatory Visit | Attending: Emergency Medicine | Admitting: Emergency Medicine

## 2015-12-10 ENCOUNTER — Emergency Department (HOSPITAL_COMMUNITY)
Admission: EM | Admit: 2015-12-10 | Discharge: 2015-12-10 | Disposition: A | Discharge status: Home / Self Care | Payer: BLUE CROSS/BLUE SHIELD | Attending: Emergency Medicine | Admitting: Emergency Medicine

## 2015-12-10 ENCOUNTER — Encounter (HOSPITAL_COMMUNITY): Payer: Self-pay | Admitting: Family Medicine

## 2015-12-10 DIAGNOSIS — Z79899 Other long term (current) drug therapy: Secondary | ICD-10-CM | POA: Diagnosis not present

## 2015-12-10 DIAGNOSIS — Y9389 Activity, other specified: Secondary | ICD-10-CM | POA: Insufficient documentation

## 2015-12-10 DIAGNOSIS — R109 Unspecified abdominal pain: Secondary | ICD-10-CM | POA: Diagnosis not present

## 2015-12-10 DIAGNOSIS — F909 Attention-deficit hyperactivity disorder, unspecified type: Secondary | ICD-10-CM | POA: Diagnosis not present

## 2015-12-10 DIAGNOSIS — Y9289 Other specified places as the place of occurrence of the external cause: Secondary | ICD-10-CM | POA: Diagnosis not present

## 2015-12-10 DIAGNOSIS — Z0441 Encounter for examination and observation following alleged adult rape: Secondary | ICD-10-CM | POA: Insufficient documentation

## 2015-12-10 DIAGNOSIS — F419 Anxiety disorder, unspecified: Secondary | ICD-10-CM | POA: Diagnosis not present

## 2015-12-10 DIAGNOSIS — Z3202 Encounter for pregnancy test, result negative: Secondary | ICD-10-CM | POA: Diagnosis not present

## 2015-12-10 DIAGNOSIS — F17210 Nicotine dependence, cigarettes, uncomplicated: Secondary | ICD-10-CM | POA: Insufficient documentation

## 2015-12-10 DIAGNOSIS — R42 Dizziness and giddiness: Secondary | ICD-10-CM | POA: Diagnosis not present

## 2015-12-10 DIAGNOSIS — Y999 Unspecified external cause status: Secondary | ICD-10-CM | POA: Diagnosis not present

## 2015-12-10 DIAGNOSIS — F329 Major depressive disorder, single episode, unspecified: Secondary | ICD-10-CM | POA: Insufficient documentation

## 2015-12-10 DIAGNOSIS — T7421XA Adult sexual abuse, confirmed, initial encounter: Secondary | ICD-10-CM | POA: Diagnosis not present

## 2015-12-10 MED ORDER — LIDOCAINE HCL (PF) 1 % IJ SOLN
0.9000 mL | Freq: Once | INTRAMUSCULAR | Status: AC
  Administered 2015-12-10: 0.9 mL

## 2015-12-10 MED ORDER — CEFTRIAXONE SODIUM 250 MG IJ SOLR
250.0000 mg | Freq: Once | INTRAMUSCULAR | Status: AC
  Administered 2015-12-10: 250 mg via INTRAMUSCULAR

## 2015-12-10 MED ORDER — METRONIDAZOLE 500 MG PO TABS
2000.0000 mg | ORAL_TABLET | Freq: Once | ORAL | Status: AC
  Administered 2015-12-10: 2000 mg via ORAL
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
## 2015-12-10 NOTE — ED Notes (Signed)
NS paging [facility] nurse
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
## 2015-12-11 LAB — POC URINE PREG, ED: Preg Test, Ur: NEGATIVE
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
## 2015-12-12 ENCOUNTER — Ambulatory Visit (INDEPENDENT_AMBULATORY_CARE_PROVIDER_SITE_OTHER): Payer: BLUE CROSS/BLUE SHIELD | Admitting: Osteopathic Medicine

## 2015-12-12 VITALS — BP 128/80 | HR 87 | Temp 97.9°F | Resp 16 | Ht 66.0 in | Wt 147.0 lb

## 2015-12-12 DIAGNOSIS — Z8639 Personal history of other endocrine, nutritional and metabolic disease: Secondary | ICD-10-CM | POA: Diagnosis not present

## 2015-12-12 MED ORDER — CYANOCOBALAMIN 1000 MCG/ML IJ SOLN
1000.0000 ug | Freq: Once | INTRAMUSCULAR | Status: AC
  Administered 2015-12-12: 1000 ug via INTRAMUSCULAR

## 2015-12-12 NOTE — Patient Instructions (Addendum)
  Anything else we can do for you, please let us know. Otherwise, plan to follow-up as directed with your primary care physician.  Take care! -Dr. Mervyn SkeetersA.    IF you received an x-ray today, you will receive an invoice from [facility]. Please contact [facility] at [phone] with questions or concerns regarding your invoice.   IF you received labwork today, you will receive an invoice from [facility]. Please contact [facility] at [phone] with questions or concerns regarding your invoice.   Our billing staff will not be able to assist you with questions regarding bills from these companies.  You will be contacted with the lab results as soon as they are available. The fastest way to get your results is to activate your My Chart account. Instructions are located on the last page of this paperwork. If you have not heard from us regarding the results in 2 weeks, please contact this office.

## 2015-12-12 NOTE — Progress Notes (Signed)
HPI: Renee Mcdowell is a 20 y.o. female who presents to [facility] today for chief complaint of:  Chief Complaint  Patient presents with  . Immunizations    pt. needs b-12 vaccine, pt brought med. with her    Patient brings bottle of B12 vials with her, she states that her other PCP who is in hour away typically handles this for her but she is unable to go back there at this time. Just wanted injection today, otherwise no complaints.   Prescription bottle examined:  Scriber - Jimmy FootmanAndrea Hernandez-Gonzalez MD  F[facility] [address]   Labs reviewed, B12 was checked 11/19/2015 3 weeks ago and was normal. No other labs available.    Past medical, social and family history reviewed: Past Medical History  Diagnosis Date  . ADHD (attention deficit hyperactivity disorder)   . Depression   . Anxiety    History reviewed. No pertinent past surgical history. Social History  Substance Use Topics  . Smoking status: Current Every Day Smoker -- 0.25 packs/day    Types: Cigarettes  . Smokeless tobacco: Never Used  . Alcohol Use: 0.0 oz/week    0 Standard drinks or equivalent per week   Family History  Problem Relation Age of Onset  . Mental illness Father     Schizoaffective disorder    Current Outpatient Prescriptions  Medication Sig Dispense Refill  . amphetamine-dextroamphetamine (ADDERALL) 15 MG tablet Take 15 mg by mouth daily.    . clonazePAM (KLONOPIN) 0.5 MG tablet Take 0.5 tablets (0.25 mg total) by mouth 3 (three) times daily as needed for anxiety. 15 tablet 0  . cyanocobalamin (,VITAMIN B-12,) 1000 MCG/ML injection Inject 1 mL (1,000 mcg total) into the muscle once a week. 4 mL 0  . FLUoxetine (PROZAC) 10 MG capsule Take 1 capsule (10 mg total) by mouth daily. 30 capsule 0  . hydrOXYzine (ATARAX/VISTARIL) 25 MG tablet Take 1 tablet (25 mg total) by mouth at bedtime as needed. 30 tablet 0   No current facility-administered medications  for this visit.   Allergies  Allergen Reactions  . Zithromax [Azithromycin] Hives      Review of Systems: CONSTITUTIONAL:  No  fever,  CARDIAC: No  chest pain NEUROLOGIC: No  weakness, No  dizziness  Exam:  BP 128/80 mmHg  Pulse 87  Temp(Src) 97.9 F (36.6 C) (Oral)  Resp 16  Ht 5\' 6"  (1.676 m)  Wt 147 lb (66.679 kg)  BMI 23.74 kg/m2  SpO2 98%  LMP 12/10/2015 Constitutional: VS see above. General Appearance: alert, well-developed, well-nourished, NAD Skin: warm, dry, intact. No rash/ulcer. No concerning nevi or subq nodules on limited exam.   Psychiatric: Normal judgment/insight. Normal mood and affect. Oriented x3.    Results for orders placed or performed during the hospital encounter of 12/10/15 (from the past 72 hour(s))  POC Urine Pregnancy, ED (do NOT order at [facility])     Status: None   Collection Time: 12/10/15  5:20 PM  Result Value Ref Range   Preg Test, Ur NEGATIVE NEGATIVE    Comment:        THE SENSITIVITY OF THIS METHODOLOGY IS >24 mIU/mL       ASSESSMENT/PLAN:  History of non anemic vitamin B12 deficiency   See medication and prescribe her information as documented above, patient okay to go, follow-up as needed   Visit summary printed and instructions reviewed with the patient. All questions answered. Return as needed.

## 2015-12-17 NOTE — SANE Note (Addendum)
[employer] Detective Georgia Domoon requesting SANE chart on 12/16/2015. Chart sent 12/17/2015 via SDFI.

## 2015-12-26 ENCOUNTER — Encounter: Payer: Self-pay | Admitting: Family Medicine

## 2016-12-26 ENCOUNTER — Encounter (HOSPITAL_COMMUNITY): Payer: Self-pay | Admitting: *Deleted

## 2016-12-26 ENCOUNTER — Emergency Department (HOSPITAL_COMMUNITY)
Admission: EM | Admit: 2016-12-26 | Discharge: 2016-12-26 | Disposition: A | Discharge status: Other Acute Inpatient Hospital | Payer: BLUE CROSS/BLUE SHIELD | Attending: Emergency Medicine | Admitting: Emergency Medicine

## 2016-12-26 ENCOUNTER — Inpatient Hospital Stay (HOSPITAL_COMMUNITY)
Admission: AD | Admit: 2016-12-26 | Discharge: 2016-12-31 | DRG: 885 | Disposition: A | Discharge status: Home / Self Care | Payer: BLUE CROSS/BLUE SHIELD | Source: Intra-hospital | Attending: Psychiatry | Admitting: Psychiatry

## 2016-12-26 DIAGNOSIS — F4010 Social phobia, unspecified: Secondary | ICD-10-CM | POA: Diagnosis present

## 2016-12-26 DIAGNOSIS — E538 Deficiency of other specified B group vitamins: Secondary | ICD-10-CM | POA: Diagnosis present

## 2016-12-26 DIAGNOSIS — F909 Attention-deficit hyperactivity disorder, unspecified type: Secondary | ICD-10-CM | POA: Insufficient documentation

## 2016-12-26 DIAGNOSIS — Y929 Unspecified place or not applicable: Secondary | ICD-10-CM | POA: Insufficient documentation

## 2016-12-26 DIAGNOSIS — Z23 Encounter for immunization: Secondary | ICD-10-CM | POA: Insufficient documentation

## 2016-12-26 DIAGNOSIS — G4700 Insomnia, unspecified: Secondary | ICD-10-CM | POA: Diagnosis present

## 2016-12-26 DIAGNOSIS — F322 Major depressive disorder, single episode, severe without psychotic features: Secondary | ICD-10-CM | POA: Diagnosis not present

## 2016-12-26 DIAGNOSIS — Z79899 Other long term (current) drug therapy: Secondary | ICD-10-CM | POA: Insufficient documentation

## 2016-12-26 DIAGNOSIS — F901 Attention-deficit hyperactivity disorder, predominantly hyperactive type: Secondary | ICD-10-CM | POA: Diagnosis not present

## 2016-12-26 DIAGNOSIS — F1099 Alcohol use, unspecified with unspecified alcohol-induced disorder: Secondary | ICD-10-CM | POA: Diagnosis not present

## 2016-12-26 DIAGNOSIS — Z881 Allergy status to other antibiotic agents status: Secondary | ICD-10-CM

## 2016-12-26 DIAGNOSIS — Y999 Unspecified external cause status: Secondary | ICD-10-CM | POA: Insufficient documentation

## 2016-12-26 DIAGNOSIS — S61519A Laceration without foreign body of unspecified wrist, initial encounter: Secondary | ICD-10-CM | POA: Diagnosis present

## 2016-12-26 DIAGNOSIS — F4310 Post-traumatic stress disorder, unspecified: Secondary | ICD-10-CM

## 2016-12-26 DIAGNOSIS — S61511A Laceration without foreign body of right wrist, initial encounter: Secondary | ICD-10-CM

## 2016-12-26 DIAGNOSIS — X781XXA Intentional self-harm by knife, initial encounter: Secondary | ICD-10-CM | POA: Diagnosis not present

## 2016-12-26 DIAGNOSIS — F1010 Alcohol abuse, uncomplicated: Secondary | ICD-10-CM | POA: Diagnosis present

## 2016-12-26 DIAGNOSIS — Z818 Family history of other mental and behavioral disorders: Secondary | ICD-10-CM

## 2016-12-26 DIAGNOSIS — X789XXA Intentional self-harm by unspecified sharp object, initial encounter: Secondary | ICD-10-CM | POA: Diagnosis present

## 2016-12-26 DIAGNOSIS — F17210 Nicotine dependence, cigarettes, uncomplicated: Secondary | ICD-10-CM | POA: Diagnosis not present

## 2016-12-26 DIAGNOSIS — Z91410 Personal history of adult physical and sexual abuse: Secondary | ICD-10-CM

## 2016-12-26 DIAGNOSIS — F332 Major depressive disorder, recurrent severe without psychotic features: Secondary | ICD-10-CM | POA: Diagnosis not present

## 2016-12-26 DIAGNOSIS — F411 Generalized anxiety disorder: Secondary | ICD-10-CM | POA: Diagnosis present

## 2016-12-26 DIAGNOSIS — Z9119 Patient's noncompliance with other medical treatment and regimen: Secondary | ICD-10-CM

## 2016-12-26 DIAGNOSIS — T1491XA Suicide attempt, initial encounter: Secondary | ICD-10-CM

## 2016-12-26 DIAGNOSIS — F900 Attention-deficit hyperactivity disorder, predominantly inattentive type: Secondary | ICD-10-CM | POA: Diagnosis present

## 2016-12-26 DIAGNOSIS — F17200 Nicotine dependence, unspecified, uncomplicated: Secondary | ICD-10-CM | POA: Diagnosis present

## 2016-12-26 DIAGNOSIS — Y939 Activity, unspecified: Secondary | ICD-10-CM | POA: Diagnosis not present

## 2016-12-26 LAB — ETHANOL: Alcohol, Ethyl (B): 183 mg/dL — ABNORMAL HIGH (ref <5)

## 2016-12-26 LAB — COMPREHENSIVE METABOLIC PANEL
ALT: 22 U/L (ref 14–54)
AST: 28 U/L (ref 15–41)
Albumin: 5.6 g/dL — ABNORMAL HIGH (ref 3.5–5.0)
Alkaline Phosphatase: 58 U/L (ref 38–126)
Anion gap: 14 (ref 5–15)
BUN: 9 mg/dL (ref 6–20)
CO2: 20 mmol/L — ABNORMAL LOW (ref 22–32)
Calcium: 9.5 mg/dL (ref 8.9–10.3)
Chloride: 110 mmol/L (ref 101–111)
Creatinine, Ser: 0.98 mg/dL (ref 0.44–1.00)
GFR calc Af Amer: >60 mL/min (ref >60)
GFR calc non Af Amer: >60 mL/min (ref >60)
Glucose, Bld: 76 mg/dL (ref 65–99)
Potassium: 3.6 mmol/L (ref 3.5–5.1)
Sodium: 144 mmol/L (ref 135–145)
Total Bilirubin: 0.4 mg/dL (ref 0.3–1.2)
Total Protein: 8.8 g/dL — ABNORMAL HIGH (ref 6.5–8.1)

## 2016-12-26 LAB — RAPID URINE DRUG SCREEN, HOSP PERFORMED
Amphetamines: POSITIVE — AB
Barbiturates: NOT DETECTED
Benzodiazepines: NOT DETECTED
Cocaine: NOT DETECTED
Opiates: NOT DETECTED
Tetrahydrocannabinol: NOT DETECTED

## 2016-12-26 LAB — SALICYLATE LEVEL: Salicylate Lvl: <7 mg/dL (ref 2.8–30.0)

## 2016-12-26 LAB — CBC
HCT: 45.8 % (ref 36.0–46.0)
Hemoglobin: 16.6 g/dL — ABNORMAL HIGH (ref 12.0–15.0)
MCH: 32.1 pg (ref 26.0–34.0)
MCHC: 36.2 g/dL — ABNORMAL HIGH (ref 30.0–36.0)
MCV: 88.6 fL (ref 78.0–100.0)
Platelets: 408 10*3/uL — ABNORMAL HIGH (ref 150–400)
RBC: 5.17 MIL/uL — ABNORMAL HIGH (ref 3.87–5.11)
RDW: 13 % (ref 11.5–15.5)
WBC: 8.9 10*3/uL (ref 4.0–10.5)

## 2016-12-26 LAB — I-STAT BETA HCG BLOOD, ED (MC, WL, AP ONLY): I-stat hCG, quantitative: <5 m[IU]/mL (ref <5)

## 2016-12-26 LAB — ACETAMINOPHEN LEVEL: Acetaminophen (Tylenol), Serum: <10 ug/mL — ABNORMAL LOW (ref 10–30)

## 2016-12-26 MED ORDER — LORAZEPAM 0.5 MG PO TABS
0.5000 mg | ORAL_TABLET | Freq: Four times a day (QID) | ORAL | Status: DC | PRN
  Administered 2016-12-26 – 2016-12-30 (×4): 0.5 mg via ORAL
  Filled 2016-12-26 (×4): qty 1

## 2016-12-26 MED ORDER — MAGNESIUM HYDROXIDE 400 MG/5ML PO SUSP: 30.0000 mL | Freq: Every day | ORAL | Status: DC | PRN

## 2016-12-26 MED ORDER — BACITRACIN ZINC 500 UNIT/GM EX OINT
TOPICAL_OINTMENT | Freq: Two times a day (BID) | CUTANEOUS | Status: DC
  Administered 2016-12-26 – 2016-12-30 (×3): via TOPICAL
  Filled 2016-12-26: qty 28.35
  Filled 2016-12-26 (×29): qty 0.9

## 2016-12-26 MED ORDER — HYDROXYZINE HCL 25 MG PO TABS
25.0000 mg | ORAL_TABLET | Freq: Four times a day (QID) | ORAL | Status: DC | PRN
  Administered 2016-12-26 – 2016-12-28 (×4): 25 mg via ORAL
  Filled 2016-12-26 (×5): qty 1

## 2016-12-26 MED ORDER — BACITRACIN ZINC 500 UNIT/GM EX OINT
TOPICAL_OINTMENT | Freq: Two times a day (BID) | CUTANEOUS | Status: DC
  Filled 2016-12-26: qty 28.35

## 2016-12-26 MED ORDER — NICOTINE 21 MG/24HR TD PT24
21.0000 mg | MEDICATED_PATCH | Freq: Every day | TRANSDERMAL | Status: DC
  Administered 2016-12-26 – 2016-12-31 (×6): 21 mg via TRANSDERMAL
  Filled 2016-12-26 (×7): qty 1

## 2016-12-26 MED ORDER — BACITRACIN ZINC 500 UNIT/GM EX OINT: TOPICAL_OINTMENT | Freq: Two times a day (BID) | CUTANEOUS | Status: DC

## 2016-12-26 MED ORDER — ACETAMINOPHEN 325 MG PO TABS
650.0000 mg | ORAL_TABLET | Freq: Four times a day (QID) | ORAL | Status: DC | PRN
Start: 2016-12-26 — End: 2016-12-26

## 2016-12-26 MED ORDER — ACETAMINOPHEN 325 MG PO TABS
650.0000 mg | ORAL_TABLET | Freq: Two times a day (BID) | ORAL | Status: DC | PRN
  Administered 2016-12-27 – 2016-12-29 (×3): 650 mg via ORAL
  Filled 2016-12-26 (×3): qty 2

## 2016-12-26 MED ORDER — ESCITALOPRAM OXALATE 5 MG PO TABS
5.0000 mg | ORAL_TABLET | Freq: Every day | ORAL | Status: DC
  Administered 2016-12-26 – 2016-12-29 (×4): 5 mg via ORAL
  Filled 2016-12-26 (×7): qty 1

## 2016-12-26 MED ORDER — TETANUS-DIPHTH-ACELL PERTUSSIS 5-2.5-18.5 LF-MCG/0.5 IM SUSP
0.5000 mL | Freq: Once | INTRAMUSCULAR | Status: AC
  Administered 2016-12-26: 0.5 mL via INTRAMUSCULAR
  Filled 2016-12-26: qty 0.5

## 2016-12-26 MED ORDER — LIDOCAINE-EPINEPHRINE (PF) 2 %-1:200000 IJ SOLN
INTRAMUSCULAR | Status: AC
  Filled 2016-12-26: qty 20

## 2016-12-26 MED ORDER — LIDOCAINE-EPINEPHRINE (PF) 2 %-1:200000 IJ SOLN
20.0000 mL | Freq: Once | INTRAMUSCULAR | Status: DC
  Filled 2016-12-26: qty 20

## 2016-12-26 MED ORDER — TRAZODONE 25 MG HALF TABLET
25.0000 mg | ORAL_TABLET | Freq: Every evening | ORAL | Status: DC | PRN
  Administered 2016-12-26 – 2016-12-27 (×2): 25 mg via ORAL
  Filled 2016-12-26 (×8): qty 1

## 2016-12-26 MED ORDER — IBUPROFEN 600 MG PO TABS
600.0000 mg | ORAL_TABLET | Freq: Four times a day (QID) | ORAL | Status: DC | PRN
  Administered 2016-12-26 – 2016-12-31 (×13): 600 mg via ORAL
  Filled 2016-12-26 (×15): qty 1
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
## 2016-12-26 NOTE — Discharge Instructions (Signed)
Keep wound clean with mild soap and water. Follow up with your primary care doctor, [facility] or ER in approximately 10 days (May 1st) for wound recheck and suture removal. Monitor area for signs of infection to include, but not limited to: increasing pain, spreading redness, drainage/pus, worsening swelling, or fevers. Return to emergency department for emergent changing or worsening symptoms.
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
## 2016-12-26 NOTE — ED Notes (Signed)
Pt speaking with TTS 
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
## 2016-12-26 NOTE — Tx Team (Signed)
Initial Treatment Plan 12/26/2016 11:56 AM [name] ZOX:[id number]    PATIENT STRESSORS: Educational concerns   PATIENT STRENGTHS: Average or above average intelligence Communication skills General fund of knowledge Motivation for treatment/growth Physical Health Supportive family/friends   PATIENT IDENTIFIED PROBLEMS: "I have a lot of stress with finals at school"  "I just broke up with my boyfriend "                   DISCHARGE CRITERIA:  Ability to meet basic life and health needs Improved stabilization in mood, thinking, and/or behavior Motivation to continue treatment in a less acute level of care Need for constant or close observation no longer present Verbal commitment to aftercare and medication compliance  PRELIMINARY DISCHARGE PLAN: Attend aftercare/continuing care group Outpatient therapy Return to previous work or school arrangements  PATIENT/FAMILY INVOLVEMENT: This treatment plan has been presented to and reviewed with the patient, Renee Mcdowell, and/or family member, .  The patient and family have been given the opportunity to ask questions and make suggestions.  Ottie Glazier, RN 12/26/2016, 11:56 AM

## 2016-12-26 NOTE — ED Triage Notes (Signed)
Pt suicidal after friend failed to attend her party. Laceration to right wrist self inflicted bleeding controlled

## 2016-12-26 NOTE — ED Notes (Signed)
Bed: RESB Expected date:  Expected time:  Means of arrival:  Comments: EMS 21 yo female/SI-wrist laceration
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
## 2016-12-26 NOTE — ED Provider Notes (Signed)
[facility]-EMERGENCY DEPT Provider Note   CSN: [id number] Arrival date & time: 12/26/16  0154  By signing my name below, I, Rosario Adie, attest that this documentation has been prepared under the direction and in the presence of [facility], PA-C.  Electronically Signed: Rosario Adie, ED Scribe. 12/26/16. 2:22 AM.  History   Chief Complaint Chief Complaint  Patient presents with  . Suicidal   The history is provided by the patient. No language interpreter was used.    HPI Comments: Renee Mcdowell is a 21 y.o. female brought in by EMS with a PMHx of anxiety, depression who presents to the Emergency Department complaining of intermittent suicidal ideation and thoughts of self-harm beginning over the past week. Per pt, her boyfriend who lives with her broke up with approximately one week ago and she has been increased stressed with her school work which had caused some of her thoughts. Additionally, pt reports that she was having a birthday party for herself tonight and several of her friends did not show up, acutely worsening her symptoms. She attempted to harm herself by lacerating her right wrist with a metal knife tonight as well, but denies this as being an attempt to kill herself. Bleeding to the area is controlled with pressure dressing. She did apply alcohol to her wound prior to coming into the ED as well. No SI at present. She denies HI, auditory/visual hallucinations, chest pain, shortness of breath, or any other associated symptoms. Tdap is not UTD.  Past Medical History:  Diagnosis Date  . ADHD (attention deficit hyperactivity disorder)   . Anxiety   . Depression    Patient Active Problem List   Diagnosis Date Noted  . Vitamin B12 deficiency 11/21/2015  . Social phobia 11/19/2015  . Tobacco use disorder 11/19/2015  . Major depressive disorder, recurrent episode, moderate (HCC) 11/19/2015  . Alcohol use disorder, mild, abuse 11/19/2015  . Attention deficit  hyperactivity disorder (ADHD) 11/19/2015   No past surgical history on file.  OB History    No data available     Home Medications    Prior to Admission medications   Medication Sig Start Date End Date Taking? Authorizing Provider  amphetamine-dextroamphetamine (ADDERALL XR) 20 MG 24 hr capsule Take 20 mg by mouth daily as needed (focus).   Yes Historical Provider, MD  amphetamine-dextroamphetamine (ADDERALL) 5 MG tablet Take 5 mg by mouth daily as needed (focus).   Yes Historical Provider, MD  clonazePAM (KLONOPIN) 0.5 MG tablet Take 0.5 tablets (0.25 mg total) by mouth 3 (three) times daily as needed for anxiety. Patient not taking: Reported on 12/26/2016 11/20/15   Jimmy Footman, MD  cyanocobalamin (,VITAMIN B-12,) 1000 MCG/ML injection Inject 1 mL (1,000 mcg total) into the muscle once a week. Patient not taking: Reported on 12/26/2016 11/21/15   Jimmy Footman, MD  FLUoxetine (PROZAC) 10 MG capsule Take 1 capsule (10 mg total) by mouth daily. Patient not taking: Reported on 12/26/2016 11/20/15   Jimmy Footman, MD  hydrOXYzine (ATARAX/VISTARIL) 25 MG tablet Take 1 tablet (25 mg total) by mouth at bedtime as needed. Patient not taking: Reported on 12/26/2016 11/20/15   Jimmy Footman, MD   Family History Family History  Problem Relation Age of Onset  . Mental illness Father     Schizoaffective disorder   Social History Social History  Substance Use Topics  . Smoking status: Current Every Day Smoker    Packs/day: 0.25    Types: Cigarettes  . Smokeless tobacco: Never Used  .  Alcohol use 0.0 oz/week   Allergies   Zithromax [azithromycin]  Review of Systems Review of Systems  Respiratory: Negative for shortness of breath.   Cardiovascular: Negative for chest pain.  Psychiatric/Behavioral: Positive for self-injury and suicidal ideas. Negative for hallucinations.       No HI.   All other systems reviewed and are negative.  Physical  Exam Updated Vital Signs BP 115/74 (BP Location: Left Arm)   Pulse (!) 105   Temp 97.9 F (36.6 C) (Oral)   Resp 19   Ht  (1.651 m)   Wt 63.5 kg   SpO2 100%   BMI 23.30 kg/m   Physical Exam  Constitutional: She is oriented to person, place, and time. She appears well-developed and well-nourished. No distress.  HENT:  Head: Normocephalic and atraumatic.  Cardiovascular: Normal rate, regular rhythm and normal heart sounds.   No murmur heard. Pulmonary/Chest: Effort normal and breath sounds normal. No respiratory distress.  Abdominal: Soft. She exhibits no distension. There is no tenderness.  Musculoskeletal: She exhibits no edema.  6cm linear laceration to the right wrist. Full ROM of the right wrist and hand. Good grip strength. Strength and sensation intact median, ulnar, and radial nerve distributions. 2+ radial pulses, good capillary refill.   Neurological: She is alert and oriented to person, place, and time.  Skin: Skin is warm and dry.  Nursing note and vitals reviewed.  ED Treatments / Results  DIAGNOSTIC STUDIES: Oxygen Saturation is 100% on RA, normal by my interpretation.   COORDINATION OF CARE: 2:22 AM-Discussed next steps with pt. Pt verbalized understanding and is agreeable with the plan.   Labs (all labs ordered are listed, but only abnormal results are displayed) Labs Reviewed  COMPREHENSIVE METABOLIC PANEL - Abnormal; Notable for the following:       Result Value   CO2 20 (*)    Total Protein 8.8 (*)    Albumin 5.6 (*)    All other components within normal limits  ETHANOL - Abnormal; Notable for the following:    Alcohol, Ethyl (B) 183 (*)    All other components within normal limits  ACETAMINOPHEN LEVEL - Abnormal; Notable for the following:    Acetaminophen (Tylenol), Serum <10 (*)    All other components within normal limits  CBC - Abnormal; Notable for the following:    RBC 5.17 (*)    Hemoglobin 16.6 (*)    MCHC 36.2 (*)    Platelets 408  (*)    All other components within normal limits  RAPID URINE DRUG SCREEN, HOSP PERFORMED - Abnormal; Notable for the following:    Amphetamines POSITIVE (*)    All other components within normal limits  SALICYLATE LEVEL  I-STAT BETA HCG BLOOD, ED ([facility], [facility], [facility] ONLY)    EKG  EKG Interpretation None      Radiology No results found.  Procedures Procedures   LACERATION REPAIR Performed by: Chase Picket Kobe Jansma Authorized by: Chase Picket [place] Thielman Consent: Verbal consent obtained. Risks and benefits: risks, benefits and alternatives were discussed Consent given by: patient Patient identity confirmed: provided demographic data Prepped and Draped in normal sterile fashion Wound explored Laceration Location: Right wrist Laceration Length: 6 cm No Foreign Bodies seen or palpated Anesthesia: local infiltration Local anesthetic: lidocaine 2% with epinephrine Anesthetic total: 8 ml Irrigation method: syringe Amount of cleaning: standard Skin closure: 5-0 Prolene Number of sutures: 12 Technique: simple interrupted  Patient tolerance: Patient tolerated the procedure well with no immediate complications.  Medications  Ordered in ED Medications  lidocaine-EPINEPHrine (XYLOCAINE W/EPI) 2 %-1:200000 (PF) injection 20 mL (not administered)  lidocaine-EPINEPHrine (XYLOCAINE W/EPI) 2 %-1:200000 (PF) injection (not administered)  bacitracin ointment (not administered)  Tdap (BOOSTRIX) injection 0.5 mL (0.5 mLs Intramuscular Given 12/26/16 0332)    Initial Impression / Assessment and Plan / ED Course  I have reviewed the triage vital signs and the nursing notes.  Pertinent labs & imaging results that were available during my care of the patient were reviewed by me and considered in my medical decision making (see chart for details).    Renee Mcdowell is a 21 y.o. female who presents to ED after intentionally cutting right wrist. Strength and sensation intact. Full ROM. Good cap refill. Tdap  updated and laceration repaired as dictated above. Patient tolerated procedure well. Wound care instructions discussed. ETOH 183. UDS + for amphetamines. Medically cleared. Given intentional self-harm, TTS was consulted who recommend inpatient treatment. Bacitracin BID for wound placed. Will need suture removal in 10 days - wrote this on discharge information.   Final Clinical Impressions(s) / ED Diagnoses   Final diagnoses:  Suicidal behavior with attempted self-injury (HCC)  Laceration of right wrist, initial encounter   New Prescriptions New Prescriptions   No medications on file   I personally performed the services described in this documentation, which was scribed in my presence. The recorded information has been reviewed and is accurate.    [facility] Soliyana Mcchristian, PA-C 12/26/16 1191    Tomasita Crumble, MD 12/26/16 [phone]

## 2016-12-26 NOTE — ED Notes (Addendum)
Patient appear anxious and tearful when talking with this Clinical research associate. Patient stated that she felt really bad when her friends didn't show up at her party. She reported breaking up with boyfriend whom she was living with and has a plan of moving out next week. She denies pain, SI/HI, AH/VH at this time and do contracted for safety while on unit. Endorses mild anxiety. Will continue to monitor patient.

## 2016-12-26 NOTE — Progress Notes (Signed)
[facility] Admit Note: 21 yo female admitted to Dr. Jodie Echevaria services on the adult inpt unit for further evaluation and treatment of a possible mood disorder. Pt presents to [facility]with [facility] providing transport from [facility] ED. Pt is very tearful on admission.Pt has a self inflicted laceration to her right wrist which required 12 sutures while in the ED. Pt says that the self harm was not an attempted suicide but the cut went deeper than she anticipated. Says the self harm was due to being overwhelmed at school with final exams approaching and a recent breakup with her boyfriend.  Also says that she felt lonely and that no one cared about her because only 1 person showed up for her birthday party. Pt is oriented to unit and handbook given. Pt does complain of pain to wrist and asked for some medication for the pain be ordered. MD notified.

## 2016-12-26 NOTE — [facility] Counselor (Signed)
PSA attempt unsuccessful as patient in extended RN group.  Carney Bern, LCSW

## 2016-12-27 LAB — TSH: TSH: 0.742 u[IU]/mL (ref 0.350–4.500)

## 2016-12-27 LAB — VITAMIN B12: Vitamin B-12: 862 pg/mL (ref 180–914)

## 2016-12-27 NOTE — Progress Notes (Signed)
D: Patient up and visible in the milieu. Spoke with patient 1:1. Rating depression and hopelessness at a 3/10 and anxiety at a 5/10. Rates sleep as good, appetite as fair and concentration as good. Patient's affect flat, anxious with congruent mood. States goal for today is to "accept my circumstances outside of the hospital, go to group, meditation, positive affirmation." Complaining of R arm pain of a 5/10 this morning. No other physical problems. Patient does not desire change of dressing at this time. Will let this writer know.  A: Medicated per orders, advil prn given for pain, vistaril prn given for anxiety. Emotional support offered and self inventory reviewed. Encouraged completion of Suicide Safety Plan. Discussed POC with MD, SW.    R: Patient verbalizes understanding of POC. On reassess of patient's pain, patient was asleep. (Will reassess anxiety in 1 hour as patient was just medicated.)  Patient denies SI/HI and remains safe on level III obs.

## 2016-12-27 NOTE — Tx Team (Signed)
Interdisciplinary Treatment and Diagnostic Plan Update  12/27/2016 Time of Session: 0930 Renee Mcdowell MRN: [id number]  Principal Diagnosis: Major depressive disorder, recurrent severe without psychotic features (HCC)  Secondary Diagnoses: Principal Problem:   Major depressive disorder, recurrent severe without psychotic features (HCC) Active Problems:   Tobacco use disorder   Attention deficit hyperactivity disorder (ADHD)   PTSD (post-traumatic stress disorder)   Current Medications:  Current Facility-Administered Medications  Medication Dose Route Frequency Provider Last Rate Last Dose  . acetaminophen (TYLENOL) tablet 650 mg  650 mg Oral BID PRN Jomarie Longs, MD      . bacitracin ointment   Topical BID Rockey Situ Cobos, MD      . escitalopram (LEXAPRO) tablet 5 mg  5 mg Oral Daily Saramma Eappen, MD   5 mg at 12/27/16 0831  . hydrOXYzine (ATARAX/VISTARIL) tablet 25 mg  25 mg Oral Q6H PRN Jomarie Longs, MD   25 mg at 12/26/16 2311  . ibuprofen (ADVIL,MOTRIN) tablet 600 mg  600 mg Oral Q6H PRN Jomarie Longs, MD   600 mg at 12/27/16 4098  . LORazepam (ATIVAN) tablet 0.5 mg  0.5 mg Oral Q6H PRN Jomarie Longs, MD   0.5 mg at 12/26/16 2004  . magnesium hydroxide (MILK OF MAGNESIA) suspension 30 mL  30 mL Oral Daily PRN Charm Rings, NP      . nicotine (NICODERM CQ - dosed in mg/24 hours) patch 21 mg  21 mg Transdermal Daily Craige Cotta, MD   21 mg at 12/27/16 0831  . traZODone (DESYREL) tablet 25 mg  25 mg Oral QHS,MR X 1 Saramma Eappen, MD   25 mg at 12/26/16 2311   PTA Medications: Prescriptions Prior to Admission  Medication Sig Dispense Refill Last Dose  . amphetamine-dextroamphetamine (ADDERALL XR) 20 MG 24 hr capsule Take 20 mg by mouth daily as needed (focus).   12/25/2016 at Unknown time  . amphetamine-dextroamphetamine (ADDERALL) 5 MG tablet Take 5 mg by mouth daily as needed (focus).   Past Week at Unknown time  . clonazePAM (KLONOPIN) 0.5 MG tablet Take 0.5  tablets (0.25 mg total) by mouth 3 (three) times daily as needed for anxiety. (Patient not taking: Reported on 12/26/2016) 15 tablet 0 Not Taking at Unknown time  . cyanocobalamin (,VITAMIN B-12,) 1000 MCG/ML injection Inject 1 mL (1,000 mcg total) into the muscle once a week. (Patient not taking: Reported on 12/26/2016) 4 mL 0 Not Taking at Unknown time  . FLUoxetine (PROZAC) 10 MG capsule Take 1 capsule (10 mg total) by mouth daily. (Patient not taking: Reported on 12/26/2016) 30 capsule 0 Not Taking at Unknown time  . hydrOXYzine (ATARAX/VISTARIL) 25 MG tablet Take 1 tablet (25 mg total) by mouth at bedtime as needed. (Patient not taking: Reported on 12/26/2016) 30 tablet 0 Not Taking at Unknown time    Patient Stressors: Educational concerns  Patient Strengths: Average or above average intelligence Communication skills General fund of knowledge Motivation for treatment/growth Physical Health Supportive family/friends  Treatment Modalities: Medication Management, Group therapy, Case management,  1 to 1 session with clinician, Psychoeducation, Recreational therapy.   Physician Treatment Plan for Primary Diagnosis: Major depressive disorder, recurrent severe without psychotic features (HCC) Long Term Goal(s): Improvement in symptoms so as ready for discharge Improvement in symptoms so as ready for discharge   Short Term Goals: Ability to identify changes in lifestyle to reduce recurrence of condition will improve Ability to verbalize feelings will improve Compliance with prescribed medications will improve Ability to  identify changes in lifestyle to reduce recurrence of condition will improve Ability to verbalize feelings will improve Compliance with prescribed medications will improve  Medication Management: Evaluate patient's response, side effects, and tolerance of medication regimen.  Therapeutic Interventions: 1 to 1 sessions, Unit Group sessions and Medication  administration.  Evaluation of Outcomes: Progressing  Physician Treatment Plan for Secondary Diagnosis: Principal Problem:   Major depressive disorder, recurrent severe without psychotic features (HCC) Active Problems:   Tobacco use disorder   Attention deficit hyperactivity disorder (ADHD)   PTSD (post-traumatic stress disorder)  Long Term Goal(s): Improvement in symptoms so as ready for discharge Improvement in symptoms so as ready for discharge   Short Term Goals: Ability to identify changes in lifestyle to reduce recurrence of condition will improve Ability to verbalize feelings will improve Compliance with prescribed medications will improve Ability to identify changes in lifestyle to reduce recurrence of condition will improve Ability to verbalize feelings will improve Compliance with prescribed medications will improve     Medication Management: Evaluate patient's response, side effects, and tolerance of medication regimen.  Therapeutic Interventions: 1 to 1 sessions, Unit Group sessions and Medication administration.  Evaluation of Outcomes: Progressing   RN Treatment Plan for Primary Diagnosis: Major depressive disorder, recurrent severe without psychotic features (HCC) Long Term Goal(s): Knowledge of disease and therapeutic regimen to maintain health will improve  Short Term Goals: Ability to remain free from injury will improve, Ability to disclose and discuss suicidal ideas and Ability to identify and develop effective coping behaviors will improve  Medication Management: RN will administer medications as ordered by provider, will assess and evaluate patient's response and provide education to patient for prescribed medication. RN will report any adverse and/or side effects to prescribing provider.  Therapeutic Interventions: 1 on 1 counseling sessions, Psychoeducation, Medication administration, Evaluate responses to treatment, Monitor vital signs and CBGs as ordered,  Perform/monitor CIWA, COWS, AIMS and Fall Risk screenings as ordered, Perform wound care treatments as ordered.  Evaluation of Outcomes: Progressing   LCSW Treatment Plan for Primary Diagnosis: Major depressive disorder, recurrent severe without psychotic features (HCC) Long Term Goal(s): Safe transition to appropriate next level of care at discharge, Engage patient in therapeutic group addressing interpersonal concerns.  Short Term Goals: Engage patient in aftercare planning with referrals and resources, Facilitate patient progression through stages of change regarding substance use diagnoses and concerns and Identify triggers associated with mental health/substance abuse issues  Therapeutic Interventions: Assess for all discharge needs, 1 to 1 time with Social worker, Explore available resources and support systems, Assess for adequacy in community support network, Educate family and significant other(s) on suicide prevention, Complete Psychosocial Assessment, Interpersonal group therapy.  Evaluation of Outcomes: Progressing   Progress in Treatment: Attending groups: Yes. Participating in groups: Yes. Taking medication as prescribed: Yes. Toleration medication: Yes. Family/Significant other contact made: No, will contact:  family member/mother if patient consents Patient understands diagnosis: Yes. Discussing patient identified problems/goals with staff: Yes. Medical problems stabilized or resolved: Yes. Denies suicidal/homicidal ideation: Yes. Issues/concerns per patient self-inventory: No. Other: n/a   New problem(s) identified: No, Describe:  n/a  New Short Term/Long Term Goal(s): elimination of Si thoughts; medication stabilization; development of comprehensive mental wellness/sobriety plan.   Discharge Plan or Barriers: CSW assessing for appropriate referrals--needs counseling referral (at her college most likely). Dr Jeri Cos for medication management in [place] will need  signed.   Reason for Continuation of Hospitalization: Depression Medication stabilization Suicidal ideation  Estimated Length of Stay:  3-5 days   Attendees: Patient: 12/27/2016 8:39 AM  Physician: Dr. Jama Flavors MD 12/27/2016 8:39 AM  Nursing: Dayton Scrape; Quintella Reichert RN 12/27/2016 8:39 AM  RN Care Manager: Onnie Boer CM 12/27/2016 8:39 AM  Social Worker: Trula Slade, LCSW 12/27/2016 8:39 AM  Recreational Therapist: Juliann Pares 12/27/2016 8:39 AM  Other: Gray Bernhardt NP; Armandina Stammer NP 12/27/2016 8:39 AM  Other:  12/27/2016 8:39 AM  Other: 12/27/2016 8:39 AM    Scribe for Treatment Team: Ledell Peoples Smart, LCSW 12/27/2016 8:39 AM

## 2016-12-27 NOTE — [facility] Group Notes (Signed)
[facility] LCSW Group Therapy  12/27/2016 1:15pm  Type of Therapy:  Group Therapy vercoming Obstacles  Pt did not attend, declined invitation.    Rubina Basinski, LCSW 12/27/2016 2:42 PM  

## 2016-12-27 NOTE — [facility] Group Notes (Signed)
[facility] LCSW Aftercare Discharge Planning Group Note   12/27/2016 9:17 AM  Participation Quality:  Pt invited. Chose to remain in bed  Lakai Moree N Smart LCSW   

## 2016-12-27 NOTE — Progress Notes (Signed)
D    Pt presents with anxiety and depression   She was up at the nurses station several times wanting different things   Her dressing was changed and the area is dry intact  No infection seen small amount serous drainage    Pt was acting like she just couldn't look at the area and her peers were feeding into it  A    Verbal support given    Medications administered and effectiveness monitored   Q 15 min checks R   Pt is safe at present

## 2016-12-27 NOTE — Plan of Care (Signed)
Problem: Safety: Goal: Periods of time without injury will increase Outcome: Progressing Patient has not engaged in self harm.  Problem: Medication: Goal: Compliance with prescribed medication regimen will improve Outcome: Progressing Patient is med compliant.   
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
## 2016-12-27 NOTE — Progress Notes (Signed)
Patient attended group and said that her day was a 5. Something positive about today was today is her birthday and she received a cake from staff. Her coping skill for today was positive thinking.

## 2016-12-28 DIAGNOSIS — F1099 Alcohol use, unspecified with unspecified alcohol-induced disorder: Secondary | ICD-10-CM

## 2016-12-28 MED ORDER — TRAZODONE HCL 50 MG PO TABS
50.0000 mg | ORAL_TABLET | Freq: Every evening | ORAL | Status: DC | PRN
Start: 2016-12-28 — End: 2016-12-30
  Administered 2016-12-28 – 2016-12-29 (×3): 50 mg via ORAL
  Filled 2016-12-28 (×7): qty 1

## 2016-12-28 MED ORDER — TRAZODONE HCL 50 MG PO TABS
ORAL_TABLET | ORAL | Status: AC
  Filled 2016-12-28: qty 1

## 2016-12-28 NOTE — Progress Notes (Signed)
[facility] Group Notes:  (Nursing/MHT/Case Management/Adjunct)  Date:  12/28/2016  Time:  9:55 PM  Type of Therapy:  Psychoeducational Skills  Participation Level:  Minimal  Participation Quality:  Attentive  Affect:  Blunted  Cognitive:  Appropriate  Insight:  Appropriate  Engagement in Group:  Limited  Modes of Intervention:  Education  Summary of Progress/Problems: Patient states that she slept a great deal today and that she learned a great deal from her mother during the evening visit. She explained that she is thinking about the things that she discussed with her mother. In terms of the theme for the day, her recovery will include changing her thinking to something that is more upbeat and positive.   Renee Mcdowell 12/28/2016, 9:55 PM

## 2016-12-28 NOTE — Plan of Care (Signed)
Problem: Activity: Goal: Interest or engagement in activities will improve Outcome: Progressing Patient attending groups, talking with staff, interacting with peers and taking medications as prescribed.

## 2016-12-28 NOTE — Progress Notes (Addendum)
D:  Patient denied Si and HI, contracts for safety.  Denied A/V hallucinations. A:  Medications administered per MD orders.  Emotional support and encouragement given patient. R:  Safety maintained with 15 minute checks.  Self inventory sheet, patient sleeps good, sleep medication is helpful.  Fair appetite, normal energy level, good concentration.  Rated depression 5, hopeless 3, anxiety 4.  Denied withdrawals.  Denied SI.  Physical problems, pain, worst pain #4 in past 24 hours, wrist.  Pain medication helpful.  Goal is to change way of thinking, to process and move through fears, circumstances and how to approach them outside.  Plans to attend group, positive affirmations, set goals.  No discharge plans.  Has fear of leaving and facing outside.

## 2016-12-28 NOTE — [facility] Group Notes (Signed)
The focus of this group is to educate the patient on the purpose and policies of crisis stabilization and provide a format to answer questions about their admission.  The group details unit policies and expectations of patients while admitted.  Patient did not attend 0900 nurse education orientation group this morning.  Patient stayed in bed.   

## 2016-12-28 NOTE — Progress Notes (Signed)
Nursing Progress Note 7p-7a  D) Patient presents anxious but is pleasant and cooperative with Clinical research associate. Patient requests right wrist with 12 sutures be wrapped. Incision site is sutured, no swelling, odor, drainage or redness noted. Kerlex applied and is clean, dry and intact. Patient is seen interactive in the milieu. Patient states "I am afraid of being triggered when I go back to my boyfriends house to collect my things". Patient reports "I talked to my mom about it this evening". Patient denies SI/HI/AVH but endorses mild pain to wrist. Patient contracts for safety on the unit.  A) Emotional support given. 1:1 interaction and active listening provided. Patient medicated with PM orders as prescribed. Medications reviewed with patient. Patient verbalized understanding of medications without further questions.  Snacks and fluids provided. Opportunities for questions or concerns presented to patient. Patient encouraged to continue to work on treatment goals. Labs, vital signs and patient behavior monitored throughout shift. Patient safety maintained with q15 min safety checks.   R) Patient receptive to interaction with nurse. Patient remains safe on the unit at this time. Patient denies any adverse medication reactions at this time. Patient is resting in bed without complaints. Will continue to monitor.
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
## 2016-12-28 NOTE — Progress Notes (Signed)
Patient ID: Renee Mcdowell, female   DOB: 08-12-1996, 21 y.o.   MRN: [id number]  Pt currently presents with an anxious affect and behavior. Pt appearance is disheveled. Pt reports to writer that their goal is to "calm down after the bad news I got today." Pt preoccupied with ex boyfriend and roommate finding another roommate to replace her. Pt reports good sleep with current medication regimen, requests to take double the amount she took last night.   Pt provided with medications per providers orders. Pt's labs and vitals were monitored throughout the night. Pt given a 1:1 about emotional and mental status. Pt supported and encouraged to express concerns and questions. Pt educated on medications and stress coping skills.   Pt's safety ensured with 15 minute and environmental checks. Pt currently denies SI/HI and A/V hallucinations. Pt verbally agrees to seek staff if SI/HI or A/VH occurs and to consult with staff before acting on any harmful thoughts. Pt remains guarded with staff, interacts positively with peers. Will continue POC.

## 2016-12-28 NOTE — Plan of Care (Signed)
Problem: Education: Goal: Utilization of techniques to improve thought processes will improve Outcome: Progressing Nurse discussed depression/anxiety/coping skills with patient.    

## 2016-12-28 NOTE — [facility] Group Notes (Signed)
[facility] LCSW Group Therapy  12/28/2016 2:53 PM  Type of Therapy:  Group Therapy  Participation Level:  Active  Participation Quality:  Attentive  Affect:  Appropriate  Cognitive:  Alert and Oriented  Insight:  Improving  Engagement in Therapy:  Improving  Modes of Intervention:  Discussion, Education, Exploration, Problem-solving, Socialization and Support  Summary of Progress/Problems: [facility] Speaker came to talk about his personal journey with substance abuse and addiction. The pt processed ways by which to relate to the speaker. [facility] speaker provided handouts and educational information pertaining to groups and services offered by the [facility].   Christan Defranco N Smart LCSW 12/28/2016, 2:53 PM  

## 2016-12-28 NOTE — [facility] Suicide Risk Assessment (Signed)
[facility] INPATIENT:  Family/Significant Other Suicide Prevention Education  Suicide Prevention Education:  Education Completed; Renee Mcdowell (pt's mother) [phone] has been identified by the patient as the family member/significant other with whom the patient will be residing, and identified as the person(s) who will aid the patient in the event of a mental health crisis (suicidal ideations/suicide attempt).  With written consent from the patient, the family member/significant other has been provided the following suicide prevention education, prior to the and/or following the discharge of the patient.  The suicide prevention education provided includes the following:  Suicide risk factors  Suicide prevention and interventions  National Suicide Hotline telephone number  [facility] assessment telephone number  [facility] Emergency Assistance 911  [facility] and/or Residential Mobile Crisis Unit telephone number  Request made of family/significant other to:  Remove weapons (e.g., guns, rifles, knives), all items previously/currently identified as safety concern.    Remove drugs/medications (over-the-counter, prescriptions, illicit drugs), all items previously/currently identified as a safety concern.  The family member/significant other verbalizes understanding of the suicide prevention education information provided.  The family member/significant other agrees to remove the items of safety concern listed above.  Vandy Tsuchiya N Smart LCSW 12/28/2016, 3:25 PM

## 2016-12-29 MED ORDER — ESCITALOPRAM OXALATE 10 MG PO TABS
10.0000 mg | ORAL_TABLET | Freq: Every day | ORAL | Status: DC
  Administered 2016-12-30 – 2016-12-31 (×2): 10 mg via ORAL
  Filled 2016-12-29 (×4): qty 1

## 2016-12-29 NOTE — Progress Notes (Signed)
[facility] MD Progress Note  12/29/2016 1:53 PM Renee Mcdowell  MRN:  [id number] Subjective:   21 yo Caucasian female, single, student at [employer].  Background  history of trauma. Presented to the ER in context of suicidal behavior. Slit her wrist with a knife. She was intoxicated with alcohol at that time.  Reports multiple stressors. She recent broke off with her boyfriend. She has a pending DUI charge, she has been struggling academically.  Chart reviewed today. Patient discussed at team.  Staff reports that she did not attend groups. Her mom came to visit yesterday. Patient reports anxiety after getting some disturbing news. No associated suicidal thoughts. Staff notes that she is disheveled. She is anxious and requested for extra mediaction last night. She is preoccupied with her ex-boyfriend. She is worried about the prospect of losing her current roommate.   Seen today. States that her mom's visit yesterday opened up a can of worms. Says she had a panic attack. It relates to the situation at her place. Her ex-boyfriend and roommate has decided to give out the guest room. It is a four bed house. She had been sharing with her boyfriend. Says though she has a room of her own, they have informed her that she is not allowed back there. Patient's name is not on the lease. Patient says she has been able to think things through. One of her other friend has offered to take her into her own place. Patient says that is where she plans to go upon discharge. Says she is coping better with the break-up. Denies any further suicidal thoughts. No homicidal thoughts. No thoughts of violence. Notes that Hydroxyzine helps calm her anxiety. I used the opportunity to discuss titration of her SSRI as they treat anxiety at higher doses. Patient consented to plan.  Principal Problem: Major depressive disorder, recurrent severe without psychotic features (HCC) Diagnosis:   Patient Active Problem List   Diagnosis Date Noted  .  Major depressive disorder, recurrent severe without psychotic features (HCC) [F33.2] 12/26/2016  . PTSD (post-traumatic stress disorder) [F43.10] 12/26/2016  . Vitamin B12 deficiency [E53.8] 11/21/2015  . Social phobia [F40.10] 11/19/2015  . Tobacco use disorder [F17.200] 11/19/2015  . Alcohol use disorder, mild, abuse [F10.10] 11/19/2015  . Attention deficit hyperactivity disorder (ADHD) [F90.9] 11/19/2015   Total Time spent with patient: 20 minutes  Past Psychiatric History: As in H&P  Past Medical History:  Past Medical History:  Diagnosis Date  . ADHD (attention deficit hyperactivity disorder)   . Anxiety   . Depression    History reviewed. No pertinent surgical history. Family History:  Family History  Problem Relation Age of Onset  . Mental illness Father     Schizoaffective disorder  . Thyroid disease Mother   . Valvular heart disease Mother    Family Psychiatric  History: As in H&P Social History:  History  Alcohol Use  . 1.2 oz/week  . 2 Glasses of wine per week     History  Drug Use No    Social History   Social History  . Marital status: Single    Spouse name: N/A  . Number of children: N/A  . Years of education: N/A   Social History Main Topics  . Smoking status: Current Every Day Smoker    Packs/day: 0.25    Types: Cigarettes  . Smokeless tobacco: Never Used  . Alcohol use 1.2 oz/week    2 Glasses of wine per week  . Drug use: No  .  Sexual activity: Yes    Birth control/ protection: Implant   Other Topics Concern  . None   Social History Narrative  . None   Additional Social History:        Sleep: Good  Appetite:  Good  Current Medications: Current Facility-Administered Medications  Medication Dose Route Frequency Provider Last Rate Last Dose  . acetaminophen (TYLENOL) tablet 650 mg  650 mg Oral BID PRN Jomarie Longs, MD   650 mg at 12/29/16 1208  . bacitracin ointment   Topical BID Craige Cotta, MD   Stopped at 12/27/16 0800   . escitalopram (LEXAPRO) tablet 5 mg  5 mg Oral Daily Saramma Eappen, MD   5 mg at 12/29/16 0852  . hydrOXYzine (ATARAX/VISTARIL) tablet 25 mg  25 mg Oral Q6H PRN Jomarie Longs, MD   25 mg at 12/28/16 1215  . ibuprofen (ADVIL,MOTRIN) tablet 600 mg  600 mg Oral Q6H PRN Jomarie Longs, MD   600 mg at 12/29/16 0852  . LORazepam (ATIVAN) tablet 0.5 mg  0.5 mg Oral Q6H PRN Jomarie Longs, MD   0.5 mg at 12/28/16 1853  . magnesium hydroxide (MILK OF MAGNESIA) suspension 30 mL  30 mL Oral Daily PRN Charm Rings, NP      . nicotine (NICODERM CQ - dosed in mg/24 hours) patch 21 mg  21 mg Transdermal Daily Craige Cotta, MD   21 mg at 12/29/16 0853  . traZODone (DESYREL) tablet 50 mg  50 mg Oral QHS,MR X 1 Kerry Hough, PA-C   50 mg at 12/28/16 2247    Lab Results:  No results found for this or any previous visit (from the past 48 hour(s)).  Blood Alcohol level:  Lab Results  Component Value Date   ETH 183 (H) 12/26/2016   ETH 130 (H) 11/18/2015    Metabolic Disorder Labs: No results found for: HGBA1C, MPG No results found for: PROLACTIN No results found for: CHOL, TRIG, HDL, CHOLHDL, VLDL, LDLCALC  Physical Findings: AIMS: Facial and Oral Movements Muscles of Facial Expression: None, normal Lips and Perioral Area: None, normal Jaw: None, normal Tongue: None, normal,Extremity Movements Upper (arms, wrists, hands, fingers): None, normal Lower (legs, knees, ankles, toes): None, normal, Trunk Movements Neck, shoulders, hips: None, normal, Overall Severity Severity of abnormal movements (highest score from questions above): None, normal Incapacitation due to abnormal movements: None, normal Patient's awareness of abnormal movements (rate only patient's report): No Awareness, Dental Status Current problems with teeth and/or dentures?: No Does patient usually wear dentures?: No  CIWA:  CIWA-Ar Total: 1 COWS:  COWS Total Score: 1  Musculoskeletal: Strength & Muscle Tone: within  normal limits Gait & Station: normal Patient leans: N/A  Psychiatric Specialty Exam: Physical Exam  Constitutional: She is oriented to person, place, and time. She appears well-developed and well-nourished.  HENT:  Head: Normocephalic and atraumatic.  Eyes: Conjunctivae are normal. Pupils are equal, round, and reactive to light.  Neck: Normal range of motion. Neck supple.  Cardiovascular: Normal rate, regular rhythm and normal heart sounds.   Respiratory: Effort normal and breath sounds normal.  GI: Soft. Bowel sounds are normal.  Musculoskeletal: Normal range of motion.  Neurological: She is alert and oriented to person, place, and time. She has normal reflexes.  Skin: Skin is warm and dry.  Psychiatric:  As above    ROS  Blood pressure 115/61, pulse 61, temperature 98 F (36.7 C), temperature source Oral, resp. rate 16, height  (1.651 m), weight  59 kg (130 lb), last menstrual period 12/26/2016, SpO2 98 %.Body mass index is 21.63 kg/m.   Appearance and Behavior: Neatly dressed. Prior to interview, she was  chatting with peers at the day room. Good relatedness. Good rapport. Appropriate behavior.   Eye Contact:  Good  Speech:  Spontaneous, Normal tone and rate.   Volume:  Normal  Mood:  Feels better  Affect:  Restricted but able to mobilize some positive affect.   Thought Process:  Linear and goal directed.   Orientation:  Full (Time, Place, and Person)  Thought Content:  Future oriented. No delusional theme. No preoccupation with violent thoughts. No obsession.  No hallucination in any modality.   Suicidal Thoughts:  None  Homicidal Thoughts:  No  Memory:  Immediate;   Good Recent;   Good Remote;   Good  Judgement:  Good  Insight:  Good  Psychomotor Activity:  Normal  Concentration:  WNL  Recall:  Good  Fund of Knowledge:  Good  Language:  Good  Akathisia:  Negative  Handed:    AIMS (if indicated):     Assets:  Communication Skills Desire for  Improvement Financial Resources/Insurance Housing Physical Health Resilience  ADL's:  Intact  Cognition:  WNL  Sleep:  Number of Hours: 5.75     Treatment Plan Summary: Patient is still processing recent stressors. She has more positive outlook. No suicidal thoughts. We are still adjusting her medications. Hopeful discharge by weekend.     Psychiatric: MDD  Alcohol Use Disorder  Medical:  Psychosocial:  Recent break up Legal issues  PLAN: 1. Increase Lexapro to 10 mg daily 2. Monitor mood, behavior and interaction with peers 3. SW would facilitate discharge and aftercare.    Georgiann Cocker, MD 12/29/2016, 1:53 PMPatient ID: Renee Mcdowell, female   DOB: August 20, 1996, 21 y.o.   MRN: [id number]

## 2016-12-29 NOTE — [facility] Group Notes (Signed)
[facility] LCSW Aftercare Discharge Planning Group Note  12/29/2016 8:45 AM  Pt did not attend, declined invitation.   Janiyah Beery, LCSW 12/29/2016 9:50 AM  

## 2016-12-29 NOTE — [facility] Group Notes (Signed)
[facility] LCSW Group Therapy 12/29/2016 1:15 PM  Type of Therapy: Group Therapy- Emotion Regulation  Participation Level: Reserved  Participation Quality:  Appropriate  Affect: Appropriate  Cognitive: Alert and Oriented   Insight:  Developing/Improving  Engagement in Therapy: Developing   Modes of Intervention: Clarification, Confrontation, Discussion, Education, Exploration, Limit-setting, Orientation, Problem-solving, Rapport Building, Dance movement psychotherapist, Socialization and Support  Summary of Progress/Problems: The topic for group today was emotional regulation. This group focused on both positive and negative emotion identification and allowed group members to process ways to identify feelings, regulate negative emotions, and find healthy ways to manage internal/external emotions. Group members were asked to reflect on a time when their reaction to an emotion led to a negative outcome and explored how alternative responses using emotion regulation would have benefited them. Group members were also asked to discuss a time when emotion regulation was utilized when a negative emotion was experienced. Pt participated minimally in group discussion. She did identify a breathing technique that has been helpful in managing anxiety.    Vernie Shanks, LCSW 12/29/2016 4:07 PM

## 2016-12-29 NOTE — Plan of Care (Signed)
Problem: Coping: Goal: Ability to verbalize feelings will improve Outcome: Progressing Pt able to verbalize anxiety and grief today

## 2016-12-29 NOTE — Progress Notes (Signed)
[facility] Group Notes:  (Nursing/MHT/Case Management/Adjunct)  Date:  12/29/2016  Time:  10:20 PM  Type of Therapy:  Psychoeducational Skills  Participation Level:  Active  Participation Quality:  Attentive  Affect:  Appropriate  Cognitive:  Appropriate  Insight:  Improving  Engagement in Group:  Developing/Improving  Modes of Intervention:  Education  Summary of Progress/Problems: The patient expressed in group that she had a good day and that she found out that she has a place to live following discharge. Her goal for tomorrow is to work on the process of getting discharged from the hospital.   [name] S 12/29/2016, 10:20 PM

## 2016-12-29 NOTE — Progress Notes (Signed)
Patient ID: Renee Mcdowell, female   DOB: [birth year], 21 y.o.   MRN: [id number]  DAR: Pt. Denies SI/HI and A/V Hallucinations. She reports sleep is good, appetite is good, energy level is normal, and concentration is good. She rates depression 1/10, hopelessness 0/10, and anxiety 1/10. She reports pain in her right wrist intermittently and is receiving PRN medication for this. Support and encouragement provided to the patient. Scheduled medications administered to patient per physician's orders. Patient is minimal with Clinical research associate but cooperative. She is seen in the milieu interacting with peers however is not attending groups today. Q15 minute checks are maintained for safety.

## 2016-12-29 NOTE — Progress Notes (Signed)
Patient ID: Renee Mcdowell, female   DOB: 25-Apr-1996, 21 y.o.   MRN: [id number]  Pt currently presents with a blunted affect and anxious behavior. Mood lability is decreased from yesterday. Pt reports to writer that their goal is to "figure out where I am going to live when I leave." Pt states "I found somewhere and have a support group of people to help me move." Pt requests to take more sleep medication due to difficulty falling asleep.   Pt provided with scheduled and as needed medications per providers orders. Pt's labs and vitals were monitored throughout the night. Pt given a 1:1 about emotional and mental status. Pt supported and encouraged to express concerns and questions. Pt educated on medications and assertiveness techniques.  Pt's safety ensured with 15 minute and environmental checks. Pt currently denies SI/HI and A/V hallucinations. Pt verbally agrees to seek staff if SI/HI or A/VH occurs and to consult with staff before acting on any harmful thoughts. Pt interacts positively with peers in the milieu. Will continue POC.

## 2016-12-29 NOTE — Progress Notes (Signed)
Recreation Therapy Notes  Date: 12/29/16 Time: 0930 Location: [address] Dayroom  Group Topic: Stress Management  Goal Area(s) Addresses:  Patient will verbalize importance of using healthy stress management.  Patient will identify positive emotions associated with healthy stress management.   Intervention: Stress Management  Activity :  Meditation.  LRT introduced the stress management technique of meditation.  LRT played a meditation focused on resilience from the Calm app to patients to fully participate in the activity.  Patients were to follow along as the meditation played to engage in the technique.  Education:  Stress Management, Discharge Planning.   Education Outcome: Acknowledges edcuation/In group clarification offered/Needs additional education  Clinical Observations/Feedback: Pt did not attend group.   Caroll Rancher, LRT/CTRS         Caroll Rancher A 12/29/2016 11:43 AM

## 2016-12-30 MED ORDER — TRAZODONE HCL 50 MG PO TABS
50.0000 mg | ORAL_TABLET | Freq: Every evening | ORAL | Status: DC | PRN
  Administered 2016-12-30: 50 mg via ORAL
  Filled 2016-12-30: qty 1

## 2016-12-30 NOTE — Progress Notes (Signed)
D: Patient up and visible in the milieu. Spoke with patient 1:1. Rating depression at a 0/10, hopelessness at a 0/10 and anxiety at a 1/10. Rates sleep as good, appetite as good, energy as normal and concentration as good. Patient's affect anxious with congruent mood. States goal for today is to "setting a goal for discharge and staying positive. Also, meet with the doctor and repeat positive affirmations." Denies pain, physical problems as patient had received ibuprofen at 0639.    A: Medicated per orders, no prns requested or required. Emotional support offered and self inventory reviewed. Encouraged completion of Suicide Safety Plan. Discussed POC with MD, SW.   R: Patient verbalizes understanding of POC. Patient denies SI/HI and remains safe on level III obs.

## 2016-12-30 NOTE — [facility] Group Notes (Signed)
[facility] LCSW Group Therapy 12/30/2016 1:15pm  Type of Therapy: Group Therapy- Balance in Life  Participation Level: Active   Description of the Group:  The topic for group was balance in life. Today's group focused on defining balance in one's own words, identifying things that can knock one off balance, and exploring healthy ways to maintain balance in life. Group members were asked to provide an example of a time when they felt off balance, describe how they handled that situation,and process healthier ways to regain balance in the future. Group members were asked to share the most important tool for maintaining balance that they learned while at [facility] and how they plan to apply this method after discharge.  Summary of Patient Progress Pt was able to encourage another peer and articulate coping skills and problem solving strategies. Pt remained positive and validated her peer's anxiety.    Therapeutic Modalities:   Cognitive Behavioral Therapy Solution-Focused Therapy Assertiveness Training   Vernie Shanks, LCSW 12/30/2016 2:59 PM

## 2016-12-30 NOTE — [facility] Group Notes (Signed)
[facility] Group Notes:  (Nursing/MHT/Case Management/Adjunct)  Date:  12/30/2016  Time:  0900  Type of Therapy:  Nurse Education  Participation Level:  Did Not Attend  Participation Quality:    Affect:    Cognitive:    Insight:    Engagement in Group:    Modes of Intervention:    Summary of Progress/Problems: Invited but did not attend.  Renee Mcdowell [facility] 12/30/2016, [phone]

## 2016-12-30 NOTE — Plan of Care (Signed)
Problem: Education: Goal: Verbalization of understanding the information provided will improve Outcome: Progressing Patient verbalizes understanding of information, education provided.  Problem: Self-Concept: Goal: Level of anxiety will decrease Outcome: Progressing Patient rates her anxiety at a 1/10.

## 2016-12-30 NOTE — Progress Notes (Signed)
The patient attended the evening Karaoke group and was appropriate.  

## 2016-12-30 NOTE — Progress Notes (Signed)
[facility] MD Progress Note  12/30/2016 3:06 PM Renee Mcdowell  MRN:  [id number] Subjective:   Patient reports she is feeling better than prior to admission. States she feels she has made progress while admitted, and " have been using coping skills I have learnt". At this time denies any suicidal or self injurious ideations. She is future oriented, and at this time focusing on discharging soon, with a plan of returning to college to complete her semester. Denies medication side effects.  Objective: I have discussed case with treatment team and have met with patient. She presents with improving mood and range of affect . She is tolerating Lexapro well , no side effects thus far. She has been visible on unit, going to some groups, interacting with peers of about her age. No disruptive or agitated behaviors on unit.    Principal Problem: Major depressive disorder, recurrent severe without psychotic features ([place]) Diagnosis:   Patient Active Problem List   Diagnosis Date Noted  . Major depressive disorder, recurrent severe without psychotic features ([place]) [F33.2] 12/26/2016  . PTSD (post-traumatic stress disorder) [F43.10] 12/26/2016  . Vitamin B12 deficiency [E53.8] 11/21/2015  . Social phobia [F40.10] 11/19/2015  . Tobacco use disorder [F17.200] 11/19/2015  . Alcohol use disorder, mild, abuse [F10.10] 11/19/2015  . Attention deficit hyperactivity disorder (ADHD) [F90.9] 11/19/2015   Total Time spent with patient: 20 minutes  Past Psychiatric History: As in H&P  Past Medical History:  Past Medical History:  Diagnosis Date  . ADHD (attention deficit hyperactivity disorder)   . Anxiety   . Depression    History reviewed. No pertinent surgical history. Family History:  Family History  Problem Relation Age of Onset  . Mental illness Father     Schizoaffective disorder  . Thyroid disease Mother   . Valvular heart disease Mother    Family Psychiatric  History: As in H&P Social  History:  History  Alcohol Use  . 1.2 oz/week  . 2 Glasses of wine per week     History  Drug Use No    Social History   Social History  . Marital status: Single    Spouse name: N/A  . Number of children: N/A  . Years of education: N/A   Social History Main Topics  . Smoking status: Current Every Day Smoker    Packs/day: 0.25    Types: Cigarettes  . Smokeless tobacco: Never Used  . Alcohol use 1.2 oz/week    2 Glasses of wine per week  . Drug use: No  . Sexual activity: Yes    Birth control/ protection: Implant   Other Topics Concern  . None   Social History Narrative  . None   Additional Social History:        Sleep: Good  Appetite:  Good  Current Medications: Current Facility-Administered Medications  Medication Dose Route Frequency Provider Last Rate Last Dose  . acetaminophen (TYLENOL) tablet 650 mg  650 mg Oral BID PRN Ursula Alert, MD   650 mg at 12/29/16 2220  . bacitracin ointment   Topical BID Jenne Campus, MD   Stopped at 12/30/16 0800  . escitalopram (LEXAPRO) tablet 10 mg  10 mg Oral Daily Artist Beach, MD   10 mg at 12/30/16 0810  . hydrOXYzine (ATARAX/VISTARIL) tablet 25 mg  25 mg Oral Q6H PRN Ursula Alert, MD   25 mg at 12/28/16 1215  . ibuprofen (ADVIL,MOTRIN) tablet 600 mg  600 mg Oral Q6H PRN Ursula Alert, MD  600 mg at 12/30/16 0639  . LORazepam (ATIVAN) tablet 0.5 mg  0.5 mg Oral Q6H PRN Ursula Alert, MD   0.5 mg at 12/28/16 1853  . magnesium hydroxide (MILK OF MAGNESIA) suspension 30 mL  30 mL Oral Daily PRN Patrecia Pour, NP      . nicotine (NICODERM CQ - dosed in mg/24 hours) patch 21 mg  21 mg Transdermal Daily Jenne Campus, MD   21 mg at 12/30/16 0810  . traZODone (DESYREL) tablet 50 mg  50 mg Oral QHS,MR X 1 Laverle Hobby, PA-C   50 mg at 12/29/16 2311    Lab Results:  No results found for this or any previous visit (from the past 48 hour(s)).  Blood Alcohol level:  Lab Results  Component Value Date    ETH 183 (H) 12/26/2016   ETH 130 (H) 65/46/5035    Metabolic Disorder Labs: No results found for: HGBA1C, MPG No results found for: PROLACTIN No results found for: CHOL, TRIG, HDL, CHOLHDL, VLDL, LDLCALC  Physical Findings: AIMS: Facial and Oral Movements Muscles of Facial Expression: None, normal Lips and Perioral Area: None, normal Jaw: None, normal Tongue: None, normal,Extremity Movements Upper (arms, wrists, hands, fingers): None, normal Lower (legs, knees, ankles, toes): None, normal, Trunk Movements Neck, shoulders, hips: None, normal, Overall Severity Severity of abnormal movements (highest score from questions above): None, normal Incapacitation due to abnormal movements: None, normal Patient's awareness of abnormal movements (rate only patient's report): No Awareness, Dental Status Current problems with teeth and/or dentures?: No Does patient usually wear dentures?: No  CIWA:  CIWA-Ar Total: 1 COWS:  COWS Total Score: 1  Musculoskeletal: Strength & Muscle Tone: within normal limits Gait & Station: normal Patient leans: N/A  Psychiatric Specialty Exam: Physical Exam  Constitutional: She is oriented to person, place, and time. She appears well-developed and well-nourished.  HENT:  Head: Normocephalic and atraumatic.  Eyes: Conjunctivae are normal. Pupils are equal, round, and reactive to light.  Neck: Normal range of motion. Neck supple.  Cardiovascular: Normal rate, regular rhythm and normal heart sounds.   Respiratory: Effort normal and breath sounds normal.  GI: Soft. Bowel sounds are normal.  Musculoskeletal: Normal range of motion.  Neurological: She is alert and oriented to person, place, and time. She has normal reflexes.  Skin: Skin is warm and dry.  Psychiatric:  As above    ROS no chest pain, no shortness of breath, no vomiting   Blood pressure 119/69, pulse 60, temperature 97.8 F (36.6 C), temperature source Oral, resp. rate 16, height _0  (1.651  m), weight 59 kg (130 lb), last menstrual period 12/26/2016, SpO2 98 %.Body mass index is 21.63 kg/m.   Appearance and Behavior: well groomed  Eye Contact:  Good  Speech:  Normal   Volume:  Normal  Mood: improving mood, at this time reports she is feeling less depressed, much better than prior to admission  Affect:  More reactive, smiles at times appropriately .   Thought Process:  Linear and Descriptions of Associations: Intact  Orientation:  Other:  fully alert and attentive , oriented x 3  Thought Content:  Future oriented. No delusional theme. No preoccupation with violent thoughts. No obsession.  No hallucination in any modality.   Suicidal Thoughts:  Denies - no current suicidal or self injurious ideations, contracts for safety on unit  Homicidal Thoughts:  No  Memory:  Recent and remote grossly intact   Judgement:  Good   Insight:  improved  Psychomotor Activity:   Normal   Concentration:  Good   Recall:   Good   Fund of Knowledge:  Good  Language:  Good  Akathisia:  No  Handed:    AIMS (if indicated):     Assets:  Communication Skills Desire for Improvement Physical Health  ADL's:  Intact  Cognition:  WNL  Sleep:  Number of Hours: 5.75    Assessment- patient is improving , presents with improving mood, range of affect, and is more future oriented. At this time denies any suicidal ideations, and is currently tolerating medications well .  Treatment Plan Summary: Treatment plan reviewed as below today 4/26.   PLAN: Encourage group and milieu participation to work on Radiographer, therapeutic and symptom reduction Continue  Lexapro  10 mg daily for depression, anxiety  Continue Ativan 0.5 mgrs Q 6 hours PRN for anxiety as needed  Continue Trazodone 50 mgrs QHS PRN for insomnia as needed  Treatment team working on disposition planning options   Jenne Campus, MD 12/30/2016, 3:06 PM   Patient ID: Darl Householder, female   DOB: [birth year], 21 y.o.   MRN: [id number]

## 2016-12-31 MED ORDER — ESCITALOPRAM OXALATE 10 MG PO TABS: 10.0000 mg | ORAL_TABLET | Freq: Every day | ORAL | 0 refills | Status: DC

## 2016-12-31 MED ORDER — MAGNESIUM HYDROXIDE 400 MG/5ML PO SUSP: 30.0000 mL | Freq: Every day | ORAL | Status: DC | PRN

## 2016-12-31 MED ORDER — HYDROXYZINE HCL 25 MG PO TABS: 25.0000 mg | ORAL_TABLET | Freq: Four times a day (QID) | ORAL | 0 refills | Status: DC | PRN

## 2016-12-31 MED ORDER — BACITRACIN ZINC 500 UNIT/GM EX OINT: TOPICAL_OINTMENT | Freq: Two times a day (BID) | CUTANEOUS | 0 refills | Status: DC

## 2016-12-31 MED ORDER — NICOTINE 21 MG/24HR TD PT24: 21.0000 mg | MEDICATED_PATCH | Freq: Every day | TRANSDERMAL | 0 refills | Status: DC

## 2016-12-31 MED ORDER — TRAZODONE HCL 50 MG PO TABS: 50.0000 mg | ORAL_TABLET | Freq: Every evening | ORAL | 0 refills | Status: DC | PRN

## 2016-12-31 NOTE — BHH Suicide Risk Assessment (Addendum)
[facility] Discharge Suicide Risk Assessment   Principal Problem: Major depressive disorder, recurrent severe without psychotic features [facility]) Discharge Diagnoses:  Patient Active Problem List   Diagnosis Date Noted  . Major depressive disorder, recurrent severe without psychotic features ([facility]) [F33.2] 12/26/2016  . PTSD (post-traumatic stress disorder) [F43.10] 12/26/2016  . Vitamin B12 deficiency [E53.8] 11/21/2015  . Social phobia [F40.10] 11/19/2015  . Tobacco use disorder [F17.200] 11/19/2015  . Alcohol use disorder, mild, abuse [F10.10] 11/19/2015  . Attention deficit hyperactivity disorder (ADHD) [F90.9] 11/19/2015    Total Time spent with patient: 30 minutes  Musculoskeletal: Strength & Muscle Tone: within normal limits Gait & Station: normal Patient leans: N/A  Psychiatric Specialty Exam: ROS  No headache, no chest pain, no shortness of breath, no vomiting   Blood pressure 117/73, pulse 78, temperature 98 F (36.7 C), temperature source Oral, resp. rate 16, height  (1.651 m), weight 59 kg (130 lb), last menstrual period 12/26/2016, SpO2 98 %.Body mass index is 21.63 kg/m.  General Appearance: Well Groomed  Eye Contact::  Good  Speech:  Normal Rate409  Volume:  Normal  Mood:  reports she is feeling significantly better at this time. Presents euthymic  Affect:  Appropriate and brighter   Thought Process:  Linear and Descriptions of Associations: Intact  Orientation:  Full (Time, Place, and Person)  Thought Content:  no hallucinations, no delusions, not internally preoccupied   Suicidal Thoughts:  No denies any suicidal or self injurious ideations, denies any homicidal or violent ideations  Homicidal Thoughts:  No  Memory:  recent and remote grossly intact   Judgement:  Other:  improving   Insight:  improving  Psychomotor Activity:  Normal  Concentration:  Good  Recall:  Good  Fund of Knowledge:Good  Language: Good  Akathisia:  Negative  Handed:  Right  AIMS (if  indicated):     Assets:  Communication Skills Desire for Improvement Resilience  Sleep:  Number of Hours: 5.25  Cognition: WNL  ADL's:  Intact   Mental Status Per Nursing Assessment::   On Admission:  Suicidal ideation indicated by patient, Self-harm behaviors, Belief that plan would result in death  Demographic Factors:  21 year old single female, college student   Loss Factors: Recent break up   Historical Factors: History of depression  Risk Reduction Factors:   Positive social support, Positive coping skills or problem solving skills and invested in college   Continued Clinical Symptoms:  At this time patient presents improved compared to admission- she is alert, attentive, presents with a fuller range of affect, and states her mood is " the best it has been in months". No thought disorder, no suicidal or self injurious ideations, no homicidal ideations, states she is less ruminative and less concerned about recent break up and presents future oriented, no psychotic symptoms. Denies medication side effects. Side effects discussed. Behavior on unit calm, pleasant .   Cognitive Features That Contribute To Risk:  No gross cognitive deficits noted upon discharge. Is alert , attentive, and oriented x 3   Suicide Risk:  Mild:  Suicidal ideation of limited frequency, intensity, duration, and specificity.  There are no identifiable plans, no associated intent, mild dysphoria and related symptoms, good self-control (both objective and subjective assessment), few other risk factors, and identifiable protective factors, including available and accessible social support.  Follow-up Information    Dr. Pearla Dubonnet MD Follow up on 01/05/2017.   Why:  Appt on Wed 01/05/17 at 12:15PM with Dr. Genia Del  for medication management/hospital follow-up appt. Thank you.  Contact information: [address] [place], [place] [address] Phone: [phone] Fax: [phone]       [facility] Follow up on 01/04/2017.   Why:  Assessment for counseling services with Mitzi Davenport at 11:30AM. Please arrive at 11:00AM to complete new patient paperwork. Thank you.  Contact information: [address] [place], [place] [address] Phone: [phone] Fax: [phone]          Plan Of Care/Follow-up recommendations:  Activity:  as tolerated  Diet:  regular Tests:  NA Other:  See below  Patient is leaving unit in good spirits. Plans to go live with a good friend of hers . Plans to return to college in order to complete this academic semester. Follow up as above . We discussed, reviewed, encouraged alcohol abstinence , reviewed potential negative impact of ETOH on mood.   Craige Cotta, MD 12/31/2016, 10:30 AM

## 2016-12-31 NOTE — Progress Notes (Signed)
  [facility] Adult Case Management Discharge Plan :  Will you be returning to the same living situation after discharge:  No.Pt will be moving in with a friend  At discharge, do you have transportation home?: Yes,  friend/family member Do you have the ability to pay for your medications: Yes,  [employer] private insurance  Release of information consent forms completed and submitted to medical records by CSW.  Patient to Follow up at: Follow-up Information    Dr. Pearla Dubonnet MD Follow up on 01/05/2017.   Why:  Appt on Wed 01/05/17 at 12:15PM with Dr. Genia Del for medication management/hospital follow-up appt. Thank you.  Contact information: [address] [place], [place] [address] Phone: [phone] Fax: [phone]       [facility] Follow up on 01/04/2017.   Why:  Assessment for counseling services with Mitzi Davenport at 11:30AM. Please arrive at 11:00AM to complete new patient paperwork. Thank you.  Contact information: [address] [place], [place] [address] Phone: [phone] Fax: [phone]          Next level of care provider has access to [facility] Link:no  Safety Planning and Suicide Prevention discussed: Yes,  SPE completed with pt's mother.  Have you used any form of tobacco in the last 30 days? (Cigarettes, Smokeless Tobacco, Cigars, and/or Pipes): Yes  Has patient been referred to the Quitline?: Patient refused referral  Patient has been referred for addiction treatment: Yes  Virgilio Broadhead N Smart LCSW 12/31/2016, 9:10 AM

## 2016-12-31 NOTE — Progress Notes (Signed)
D: Patient up and visible in the milieu. Energetic, silly at times. Spoke with patient 1:1 who states she is ready to leave and looking forward to being back with family. Rating depression at a 1/10, hopelessness at a 0/10 and anxiety at a 1/10. Rates sleep as good, appetite as good, energy as normal and concentration as good. Patient's affect animated, mood pleasant with mild anxiety. States goal for today is to "discharge using the tools I've learned while here. I will review my notes and packets and complete my discharge paperwork." Complained of mild pain of a 3/10 at R wrist lac.    A: Medicated per orders, prn advil given for discomfort. Dressing changed. Emotional support offered and self inventory reviewed. Suicide Safety Plan on chart and copy provided to patient. Discussed POC with MD, SW.   R: Patient verbalizes understanding of POC. On reassess, patient's pain resolved. Patient denies SI/HI and remains safe on level III obs. Awaiting discharge.

## 2016-12-31 NOTE — Progress Notes (Signed)
Pt reports she has had a good day and hopes she can be discharged tomorrow or Saturday.  She says that she cannot go back to her previous residence, but a friend has told her that she can stay with her.  She says this news has brightened her mood.  She has been observed mostly in the dayroom talking with her peers.  She says that she has been going to groups and has learned some good coping skills while she has been here.  She makes her needs known to staff.  She denies SI/HI/AVH.  Support and encouragement offered.  Discharge plans are in process.  Safety maintained with q15 minute checks.
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
## 2016-12-31 NOTE — Progress Notes (Signed)
Adult Psychoeducational Group Note  Date:  12/31/2016 Time:  10:45 AM  Group Topic/Focus:  Goals Group:   The focus of this group is to help patients establish daily goals to achieve during treatment and discuss how the patient can incorporate goal setting into their daily lives to aide in recovery.  Participation Level:  Active  Participation Quality:  Attentive  Affect:  Appropriate  Cognitive:  Alert  Insight: Good  Engagement in Group:  Engaged  Modes of Intervention:  Discussion  Additional Comments:  Pt participated in group today.  Pt states that she feels much better and is very excited about leaving.  She will continue to see her doctor when leaving and will attend the councling center.  Sopheap Basic R Kalyani Maeda 12/31/2016, 10:45 AM

## 2016-12-31 NOTE — Progress Notes (Signed)
Patient verbalizes readiness for discharge. Follow up plan explained, AVS, transition record and SRA given along with prescriptions. Sample med (lexapro) provided. All belongings returned. Patient verbalizes understanding. Denies SI/HI and assures this Clinical research associate she will seek assistance should that change. Patient discharged ambulatory and in stable condition to to mother.

## 2016-12-31 NOTE — Tx Team (Signed)
Interdisciplinary Treatment and Diagnostic Plan Update  12/31/2016 Time of Session: 0930 Renee Mcdowell MRN: [id number]  Principal Diagnosis: Major depressive disorder, recurrent severe without psychotic features ([place])  Secondary Diagnoses: Principal Problem:   Major depressive disorder, recurrent severe without psychotic features ([place]) Active Problems:   Tobacco use disorder   Attention deficit hyperactivity disorder (ADHD)   PTSD (post-traumatic stress disorder)   Current Medications:  Current Facility-Administered Medications  Medication Dose Route Frequency Provider Last Rate Last Dose  . acetaminophen (TYLENOL) tablet 650 mg  650 mg Oral BID PRN Ursula Alert, MD   650 mg at 12/29/16 2220  . bacitracin ointment   Topical BID Jenne Campus, MD   Stopped at 12/31/16 [phone]  . escitalopram (LEXAPRO) tablet 10 mg  10 mg Oral Daily Artist Beach, MD   10 mg at 12/31/16 0742  . hydrOXYzine (ATARAX/VISTARIL) tablet 25 mg  25 mg Oral Q6H PRN Ursula Alert, MD   25 mg at 12/28/16 1215  . ibuprofen (ADVIL,MOTRIN) tablet 600 mg  600 mg Oral Q6H PRN Ursula Alert, MD   600 mg at 12/30/16 2310  . LORazepam (ATIVAN) tablet 0.5 mg  0.5 mg Oral Q6H PRN Ursula Alert, MD   0.5 mg at 12/30/16 2357  . magnesium hydroxide (MILK OF MAGNESIA) suspension 30 mL  30 mL Oral Daily PRN Patrecia Pour, NP      . nicotine (NICODERM CQ - dosed in mg/24 hours) patch 21 mg  21 mg Transdermal Daily Jenne Campus, MD   21 mg at 12/31/16 0742  . traZODone (DESYREL) tablet 50 mg  50 mg Oral QHS PRN Jenne Campus, MD   50 mg at 12/30/16 2307   PTA Medications: Prescriptions Prior to Admission  Medication Sig Dispense Refill Last Dose  . amphetamine-dextroamphetamine (ADDERALL XR) 20 MG 24 hr capsule Take 20 mg by mouth daily as needed (focus).   12/25/2016 at Unknown time  . amphetamine-dextroamphetamine (ADDERALL) 5 MG tablet Take 5 mg by mouth daily as needed (focus).   Past Week at Unknown time   . clonazePAM (KLONOPIN) 0.5 MG tablet Take 0.5 tablets (0.25 mg total) by mouth 3 (three) times daily as needed for anxiety. (Patient not taking: Reported on 12/26/2016) 15 tablet 0 Not Taking at Unknown time  . cyanocobalamin (,VITAMIN B-12,) 1000 MCG/ML injection Inject 1 mL (1,000 mcg total) into the muscle once a week. (Patient not taking: Reported on 12/26/2016) 4 mL 0 Not Taking at Unknown time  . FLUoxetine (PROZAC) 10 MG capsule Take 1 capsule (10 mg total) by mouth daily. (Patient not taking: Reported on 12/26/2016) 30 capsule 0 Not Taking at Unknown time  . hydrOXYzine (ATARAX/VISTARIL) 25 MG tablet Take 1 tablet (25 mg total) by mouth at bedtime as needed. (Patient not taking: Reported on 12/26/2016) 30 tablet 0 Not Taking at Unknown time    Patient Stressors: Educational concerns  Patient Strengths: Average or above average intelligence Communication skills General fund of knowledge Motivation for treatment/growth Physical Health Supportive family/friends  Treatment Modalities: Medication Management, Group therapy, Case management,  1 to 1 session with clinician, Psychoeducation, Recreational therapy.   Physician Treatment Plan for Primary Diagnosis: Major depressive disorder, recurrent severe without psychotic features ([place]) Long Term Goal(s): Improvement in symptoms so as ready for discharge Improvement in symptoms so as ready for discharge   Short Term Goals: Ability to identify changes in lifestyle to reduce recurrence of condition will improve Ability to verbalize feelings will improve Compliance with  prescribed medications will improve Ability to identify changes in lifestyle to reduce recurrence of condition will improve Ability to verbalize feelings will improve Compliance with prescribed medications will improve  Medication Management: Evaluate patient's response, side effects, and tolerance of medication regimen.  Therapeutic Interventions: 1 to 1 sessions, Unit  Group sessions and Medication administration.  Evaluation of Outcomes: Met  Physician Treatment Plan for Secondary Diagnosis: Principal Problem:   Major depressive disorder, recurrent severe without psychotic features ([place]) Active Problems:   Tobacco use disorder   Attention deficit hyperactivity disorder (ADHD)   PTSD (post-traumatic stress disorder)  Long Term Goal(s): Improvement in symptoms so as ready for discharge Improvement in symptoms so as ready for discharge   Short Term Goals: Ability to identify changes in lifestyle to reduce recurrence of condition will improve Ability to verbalize feelings will improve Compliance with prescribed medications will improve Ability to identify changes in lifestyle to reduce recurrence of condition will improve Ability to verbalize feelings will improve Compliance with prescribed medications will improve     Medication Management: Evaluate patient's response, side effects, and tolerance of medication regimen.  Therapeutic Interventions: 1 to 1 sessions, Unit Group sessions and Medication administration.  Evaluation of Outcomes: Met  RN Treatment Plan for Primary Diagnosis: Major depressive disorder, recurrent severe without psychotic features ([place]) Long Term Goal(s): Knowledge of disease and therapeutic regimen to maintain health will improve  Short Term Goals: Ability to remain free from injury will improve, Ability to disclose and discuss suicidal ideas and Ability to identify and develop effective coping behaviors will improve  Medication Management: RN will administer medications as ordered by provider, will assess and evaluate patient's response and provide education to patient for prescribed medication. RN will report any adverse and/or side effects to prescribing provider.  Therapeutic Interventions: 1 on 1 counseling sessions, Psychoeducation, Medication administration, Evaluate responses to treatment, Monitor vital signs and CBGs  as ordered, Perform/monitor CIWA, COWS, AIMS and Fall Risk screenings as ordered, Perform wound care treatments as ordered.  Evaluation of Outcomes: Met  LCSW Treatment Plan for Primary Diagnosis: Major depressive disorder, recurrent severe without psychotic features ([place]) Long Term Goal(s): Safe transition to appropriate next level of care at discharge, Engage patient in therapeutic group addressing interpersonal concerns.  Short Term Goals: Engage patient in aftercare planning with referrals and resources, Facilitate patient progression through stages of change regarding substance use diagnoses and concerns and Identify triggers associated with mental health/substance abuse issues  Therapeutic Interventions: Assess for all discharge needs, 1 to 1 time with Social worker, Explore available resources and support systems, Assess for adequacy in community support network, Educate family and significant other(s) on suicide prevention, Complete Psychosocial Assessment, Interpersonal group therapy.  Evaluation of Outcomes:  Met   Progress in Treatment: Attending groups: Yes. Participating in groups: Yes. Taking medication as prescribed: Yes. Toleration medication: Yes. Family/Significant other contact made: SPE completed with pt's mother.  Patient understands diagnosis: Yes. Discussing patient identified problems/goals with staff: Yes. Medical problems stabilized or resolved: Yes. Denies suicidal/homicidal ideation: Yes. Issues/concerns per patient self-inventory: No. Other: n/a   New problem(s) identified: No, Describe:  n/a  New Short Term/Long Term Goal(s): elimination of Si thoughts; medication stabilization; development of comprehensive mental wellness/sobriety plan.   Discharge Plan or Barriers: CSW assessing for appropriate referrals--needs counseling referral (at her college most likely). Dr Emmie Niemann for medication management in [place] will need signed.   Reason for  Continuation of Hospitalization: none  Estimated Length of Stay: discharge today  Attendees: Patient: 12/31/2016 9:12 AM  Physician: Dr. Parke Poisson MD 12/31/2016 9:12 AM  Nursing: Rebecka Apley RN 12/31/2016 9:12 AM  RN Care Manager: Lars Pinks CM 12/31/2016 9:12 AM  Social Worker: Press photographer, LCSW 12/31/2016 9:12 AM  Recreational Therapist: Rhunette Croft 12/31/2016 9:12 AM  Other: Ricky Ala NP Lindell Spar NP 12/31/2016 9:12 AM  Other:  12/31/2016 9:12 AM  Other: 12/31/2016 9:12 AM    Scribe for Treatment Team: Kimber Relic Smart, LCSW 12/31/2016 9:12 AM

## 2017-01-12 ENCOUNTER — Encounter (HOSPITAL_COMMUNITY): Payer: Self-pay | Admitting: Emergency Medicine

## 2017-01-12 ENCOUNTER — Emergency Department (HOSPITAL_COMMUNITY)
Admission: EM | Admit: 2017-01-12 | Discharge: 2017-01-12 | Disposition: A | Discharge status: Home / Self Care | Payer: BLUE CROSS/BLUE SHIELD | Attending: Emergency Medicine | Admitting: Emergency Medicine

## 2017-01-12 DIAGNOSIS — Z4802 Encounter for removal of sutures: Secondary | ICD-10-CM | POA: Insufficient documentation

## 2017-01-12 DIAGNOSIS — F909 Attention-deficit hyperactivity disorder, unspecified type: Secondary | ICD-10-CM | POA: Diagnosis not present

## 2017-01-12 DIAGNOSIS — F17210 Nicotine dependence, cigarettes, uncomplicated: Secondary | ICD-10-CM | POA: Diagnosis not present

## 2017-01-12 MED ORDER — IBUPROFEN 200 MG PO TABS
600.0000 mg | ORAL_TABLET | Freq: Once | ORAL | Status: AC
  Administered 2017-01-12: 600 mg via ORAL
  Filled 2017-01-12: qty 3

## 2017-01-12 NOTE — ED Triage Notes (Signed)
Patient reports coming to have sutures removed from right wrist after having them placed on 4/22. Denies pain.

## 2017-01-12 NOTE — Discharge Instructions (Signed)
Read the information below.  You may return to the Emergency Department at any time for worsening condition or any new symptoms that concern you.  If you develop redness, swelling, pus draining from the wound, or fevers greater than 100.4, return to the ER immediately for a recheck.   °

## 2017-01-12 NOTE — ED Provider Notes (Signed)
[facility]EM[facility]ENC[facility] Provider Note   CSN: [id number] Arrival date & time: 01/12/17  1743  By signing my name below, I, Rosario AdieWilliam Andrew Hiatt, attest that this documentation has been prepared under the direction and in the presence of [facility] NorthEmily Amiel Mccaffrey, PA-C.  Electronically Signed: Rosario AdieWilliam Andrew Hiatt, ED Scribe. 01/12/17. 6:33 PM.  History   Chief Complaint Chief Complaint  Patient presents with  . Suture / Staple Removal   The history is provided by the patient and medical records. No language interpreter was used.   HPI Comments: Renee Mcdowell is a right-hand dominant 21 y.o. female who presents to the Emergency Department for suture removal from the right volar wrist from laceration which occurred on 12/27/16 (16 days ago). Pt was seen in the ED following self-inflicted laceration 16 days ago related to an acute bout of suicidal ideation. 12 5-0 Prolene sutures were placed to the right volar wrist. She states that the wound has been healing well and there has been no redness, warmth, drainage, or pain to the site. She also denies any suicidal  ideation at present and her symptoms from this has also improved. She denies fever, weakness numbness, or any other associated symptoms.   Past Medical History:  Diagnosis Date  . ADHD (attention deficit hyperactivity disorder)   . Anxiety   . Depression    Patient Active Problem List   Diagnosis Date Noted  . Major depressive disorder, recurrent severe without psychotic features (HCC) 12/26/2016  . PTSD (post-traumatic stress disorder) 12/26/2016  . Vitamin B12 deficiency 11/21/2015  . Social phobia 11/19/2015  . Tobacco use disorder 11/19/2015  . Alcohol use disorder, mild, abuse 11/19/2015  . Attention deficit hyperactivity disorder (ADHD) 11/19/2015   History reviewed. No pertinent surgical history.  OB History    No data available     Home Medications    Prior to Admission medications   Medication Sig Start Date End Date Taking?  Authorizing Provider  bacitracin ointment Apply topically 2 (two) times daily. 12/31/16   Oneta RackLewis, Tanika N, NP  escitalopram (LEXAPRO) 10 MG tablet Take 1 tablet (10 mg total) by mouth daily. 01/01/17   Oneta RackLewis, Tanika N, NP  hydrOXYzine (ATARAX/VISTARIL) 25 MG tablet Take 1 tablet (25 mg total) by mouth every 6 (six) hours as needed for anxiety. 12/31/16   Oneta RackLewis, Tanika N, NP  nicotine (NICODERM CQ - DOSED IN MG/24 HOURS) 21 mg/24hr patch Place 1 patch (21 mg total) onto the skin daily. 01/01/17   Oneta RackLewis, Tanika N, NP  traZODone (DESYREL) 50 MG tablet Take 1 tablet (50 mg total) by mouth at bedtime as needed for sleep. 12/31/16   Oneta RackLewis, Tanika N, NP   Family History Family History  Problem Relation Age of Onset  . Mental illness Father     Schizoaffective disorder  . Thyroid disease Mother   . Valvular heart disease Mother    Social History Social History  Substance Use Topics  . Smoking status: Current Every Day Smoker    Packs/day: 0.25    Types: Cigarettes  . Smokeless tobacco: Never Used  . Alcohol use 1.2 oz/week    2 Glasses of wine per week   Allergies   Zithromax [azithromycin]  Review of Systems Review of Systems  Constitutional: Negative for fever.  Musculoskeletal: Negative for myalgias.  Skin: Positive for wound. Negative for color change.  Neurological: Negative for weakness and numbness.  Psychiatric/Behavioral: Negative for suicidal ideas.   Physical Exam Updated Vital Signs BP (!) 143/80 (BP  Location: Left Arm)   Pulse (!) 119   Temp 98.9 F (37.2 C) (Oral)   Resp 18   Ht 5\' 5"  (1.651 m)   Wt 59 kg   LMP 12/26/2016 (Exact Date)   SpO2 99%   BMI 21.63 kg/m   Physical Exam  Constitutional: She appears well-developed and well-nourished. No distress.  HENT:  Head: Normocephalic and atraumatic.  Neck: Neck supple.  Pulmonary/Chest: Effort normal.  Neurological: She is alert.  Skin: She is not diaphoretic.  Right volar wrist with laceration, 12 sutures in  placed. No erythema, edema, warmth, discharge, or tenderness.   Nursing note and vitals reviewed.  ED Treatments / Results  DIAGNOSTIC STUDIES: Oxygen Saturation is 99% on RA, normal by my interpretation.   COORDINATION OF CARE: 6:30 PM-Discussed next steps with pt. Pt verbalized understanding and is agreeable with the plan.   Procedures .Suture Removal Date/Time: 01/12/2017 6:32 PM Performed by: Trixie Dredge Authorized by: Trixie Dredge   Consent:    Consent obtained:  Verbal   Consent given by:  Patient   Risks discussed:  Bleeding, pain and wound separation   Alternatives discussed:  No treatment Universal protocol:    Procedure explained and questions answered to patient or proxy's satisfaction: yes     Relevant documents present and verified: yes     Test results available and properly labeled: yes     Imaging studies available: yes     Required blood products, implants, devices, and special equipment available: yes     Site/side marked: yes     Immediately prior to procedure, a time out was called: yes     Patient identity confirmed:  Verbally with patient, arm band and provided demographic data Location:    Location:  Upper extremity   Upper extremity location:  Wrist   Wrist location:  R wrist Procedure details:    Wound appearance:  No signs of infection, good wound healing and clean   Number of sutures removed:  12 Post-procedure details:    Post-removal:  Antibiotic ointment applied and dressing applied   Patient tolerance of procedure:  Tolerated well, no immediate complications   Medications Ordered in ED Medications  ibuprofen (ADVIL,MOTRIN) tablet 600 mg (600 mg Oral Given 01/12/17 1853)    Initial Impression / Assessment and Plan / ED Course  I have reviewed the triage vital signs and the nursing notes.  Pertinent labs & imaging results that were available during my care of the patient were reviewed by me and considered in my medical decision making (see  chart for details).     Staple removal   Pt to ER for staple/suture removal and wound check as above. Procedure tolerated well. Vitals normal, no signs of infection. Scar minimization & return precautions given at dc. Discussed result, findings, treatment, and follow up  with patient.  Pt given return precautions.  Pt verbalizes understanding and agrees with plan.      Final Clinical Impressions(s) / ED Diagnoses   Final diagnoses:  Visit for suture removal   New Prescriptions Discharge Medication List as of 01/12/2017  6:36 PM     I personally performed the services described in this documentation, which was scribed in my presence. The recorded information has been reviewed and is accurate.    Trixie Dredge, PA-C 01/12/17 1931    Mancel Bale, MD 01/13/17 [phone]

## 2017-03-28 ENCOUNTER — Ambulatory Visit (HOSPITAL_COMMUNITY)
Admission: EM | Admit: 2017-03-28 | Discharge: 2017-03-28 | Disposition: A | Discharge status: Home / Self Care | Payer: BLUE CROSS/BLUE SHIELD | Attending: Internal Medicine | Admitting: Internal Medicine

## 2017-03-28 ENCOUNTER — Encounter (HOSPITAL_COMMUNITY): Payer: Self-pay | Admitting: Emergency Medicine

## 2017-03-28 DIAGNOSIS — M25562 Pain in left knee: Secondary | ICD-10-CM

## 2017-03-28 MED ORDER — NAPROXEN 500 MG PO TABS: 500.0000 mg | ORAL_TABLET | Freq: Two times a day (BID) | ORAL | 0 refills | Status: AC
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
## 2017-03-28 NOTE — ED Notes (Signed)
Called x1. No answer.

## 2017-03-28 NOTE — Discharge Instructions (Signed)
Start naproxen 500mg  twice a day for 10 days. Take with food to decrease stomach upset. Ice compress and elevation as needed. Wear knee brace for activity. Monitor for worsening of symptoms. Follow up with PCP for further workup if knee pain does not resolve.
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
## 2017-04-14 ENCOUNTER — Ambulatory Visit (INDEPENDENT_AMBULATORY_CARE_PROVIDER_SITE_OTHER): Payer: BLUE CROSS/BLUE SHIELD

## 2017-04-14 ENCOUNTER — Encounter (HOSPITAL_COMMUNITY): Payer: Self-pay | Admitting: Emergency Medicine

## 2017-04-14 ENCOUNTER — Ambulatory Visit (HOSPITAL_COMMUNITY)
Admission: EM | Admit: 2017-04-14 | Discharge: 2017-04-14 | Disposition: A | Discharge status: Home / Self Care | Payer: BLUE CROSS/BLUE SHIELD | Attending: Internal Medicine | Admitting: Internal Medicine

## 2017-04-14 DIAGNOSIS — R0781 Pleurodynia: Secondary | ICD-10-CM

## 2017-04-14 NOTE — ED Triage Notes (Signed)
Reports running a week ago, tripped on fencing and has a bruise and scrapes to right ribcage area.  Patient says breathing has become more difficult

## 2017-04-14 NOTE — ED Provider Notes (Signed)
[facility]    CSN: [id number] Arrival date & time: 04/14/17  1742     History   Chief Complaint Chief Complaint  Patient presents with  . Rib Injury    HPI Renee Mcdowell is a 21 y.o. female.   21 year old female comes in for right rib pain, painful on breathing, after falling on her right side of her week ago. She states she was running with her friend, who jumped over a fence, and when she attempted to do the same, tripped over the fence and fell on her side. She states she has abrasions on her abdomen, with some bruising, but had felt okay after the event. Now noticing trouble taking deep breaths due to the pain, with right rib pain. Denies shortness of breath, wheezing. Denies chest pain. Denies abdominal pain, nausea, vomiting, diarrhea, constipation. Bowel movements normal for her. Denies fever, chills, night sweats.      Past Medical History:  Diagnosis Date  . ADHD (attention deficit hyperactivity disorder)   . Anxiety   . Depression     Patient Active Problem List   Diagnosis Date Noted  . Major depressive disorder, recurrent severe without psychotic features (HCC) 12/26/2016  . PTSD (post-traumatic stress disorder) 12/26/2016  . Vitamin B12 deficiency 11/21/2015  . Social phobia 11/19/2015  . Tobacco use disorder 11/19/2015  . Alcohol use disorder, mild, abuse 11/19/2015  . Attention deficit hyperactivity disorder (ADHD) 11/19/2015    History reviewed. No pertinent surgical history.  OB History    No data available       Home Medications    Prior to Admission medications   Medication Sig Start Date End Date Taking? Authorizing Provider  escitalopram (LEXAPRO) 10 MG tablet Take 1 tablet (10 mg total) by mouth daily. 01/01/17  Yes Oneta Rack, NP  hydrOXYzine (ATARAX/VISTARIL) 25 MG tablet Take 1 tablet (25 mg total) by mouth every 6 (six) hours as needed for anxiety. 12/31/16  Yes Oneta Rack, NP  naproxen (NAPROSYN) 250 MG tablet  Take by mouth 2 (two) times daily with a meal.   Yes [provider]  amphetamine-dextroamphetamine (ADDERALL XR) 20 MG 24 hr capsule Take 20 mg by mouth daily.    [provider]  amphetamine-dextroamphetamine (ADDERALL) 5 MG tablet Take 5 mg by mouth daily.    [provider]  bacitracin ointment Apply topically 2 (two) times daily. 12/31/16   Oneta Rack, NP  nicotine (NICODERM CQ - DOSED IN MG/24 HOURS) 21 mg/24hr patch Place 1 patch (21 mg total) onto the skin daily. 01/01/17   Oneta Rack, NP  traZODone (DESYREL) 50 MG tablet Take 1 tablet (50 mg total) by mouth at bedtime as needed for sleep. 12/31/16   Oneta Rack, NP    Family History Family History  Problem Relation Age of Onset  . Mental illness Father        Schizoaffective disorder  . Thyroid disease Mother   . Valvular heart disease Mother     Social History Social History  Substance Use Topics  . Smoking status: Current Every Day Smoker    Packs/day: 0.25    Types: Cigarettes  . Smokeless tobacco: Never Used  . Alcohol use 1.2 oz/week    2 Glasses of wine per week     Allergies   Zithromax [azithromycin]   Review of Systems Review of Systems  Reason unable to perform ROS: as per HPI.     Physical Exam Triage Vital  Signs ED Triage Vitals  Enc Vitals Group     BP 04/14/17 1818 114/64     Pulse Rate 04/14/17 1818 73     Resp 04/14/17 1818 18     Temp 04/14/17 1818 98 F (36.7 C)     Temp Source 04/14/17 1818 Oral     SpO2 04/14/17 1818 99 %     Weight --      Height --      Head Circumference --      Peak Flow --      Pain Score 04/14/17 1816 6     Pain Loc --      Pain Edu? --      Excl. in GC? --    No data found.   Updated Vital Signs BP 114/64 (BP Location: Left Arm)   Pulse 73   Temp 98 F (36.7 C) (Oral)   Resp 18   LMP 04/07/2017   SpO2 99%    Physical Exam  Constitutional: She is oriented to person, place, and time. She appears  well-developed and well-nourished. No distress.  HENT:  Head: Normocephalic and atraumatic.  Cardiovascular: Normal rate, regular rhythm and normal heart sounds.  Exam reveals no gallop and no friction rub.   No murmur heard. Pulmonary/Chest: Effort normal and breath sounds normal. No respiratory distress. She has no wheezes. She has no rales.  Right anterior lower rib tenderness on palpation  Abdominal: Soft. Bowel sounds are normal. She exhibits no mass. There is no tenderness. There is no rebound and no guarding.  Healing abrasions on right upper and lower abdomen. No surrounding erythema, warmth, discharge.  Neurological: She is alert and oriented to person, place, and time.  Skin: Skin is warm and dry.     UC Treatments / Results  Labs (all labs ordered are listed, but only abnormal results are displayed) Labs Reviewed - No data to display  EKG  EKG Interpretation None       Radiology Dg Ribs Unilateral W/chest Right  Result Date: 04/14/2017 CLINICAL DATA:  Acute right chest and rib pain following fall 1 week ago. Initial encounter. EXAM: RIGHT RIBS AND CHEST - 3+ VIEW COMPARISON:  None. FINDINGS: No fracture or other bone lesions are seen involving the ribs. There is no evidence of pneumothorax or pleural effusion. Both lungs are clear. Heart size and mediastinal contours are within normal limits. IMPRESSION: Negative. Electronically Signed   By: Harmon PierJeffrey  Hu M.D.   On: 04/14/2017 19:09    Procedures Procedures (including critical care time)  Medications Ordered in UC Medications - No data to display   Initial Impression / Assessment and Plan / UC Course  I have reviewed the triage vital signs and the nursing notes.  Pertinent labs & imaging results that were available during my care of the patient were reviewed by me and considered in my medical decision making (see chart for details).     Discussed imaging results with patient, x-ray showed no rib fractures.  Patient to take NSAIDs for pain and inflammation. Heat/ice compress as needed. Discussed with patient this could take up to 2-3 weeks to completely resolve, but should be feeling better each week. Return precautions given. Patient expresses understanding and agrees to plan.    Final Clinical Impressions(s) / UC Diagnoses   Final diagnoses:  Rib pain on right side    New Prescriptions New Prescriptions   No medications on file     Lurline IdolYu, Amy V, PA-C 04/14/17 1927

## 2017-04-14 NOTE — Discharge Instructions (Signed)
X-ray negative for fractures. Take NSAIDs such as ibuprofen, naproxen for pain. Ice/heat compresses needed. This can take up to 2-3 weeks to completely resolve, but he should be feeling better each week. Monitor for any worsening of symptoms, worsening trouble with breathing, shortness of breath, follow-up for reevaluation.

## 2017-04-28 ENCOUNTER — Ambulatory Visit (HOSPITAL_COMMUNITY)
Admission: EM | Admit: 2017-04-28 | Discharge: 2017-04-28 | Disposition: A | Discharge status: Home / Self Care | Payer: BLUE CROSS/BLUE SHIELD | Attending: Emergency Medicine | Admitting: Emergency Medicine

## 2017-04-28 ENCOUNTER — Encounter (HOSPITAL_COMMUNITY): Payer: Self-pay | Admitting: Emergency Medicine

## 2017-04-28 DIAGNOSIS — H1032 Unspecified acute conjunctivitis, left eye: Secondary | ICD-10-CM

## 2017-04-28 MED ORDER — TETRACAINE HCL 0.5 % OP SOLN
1.0000 [drp] | Freq: Once | OPHTHALMIC | Status: AC
  Administered 2017-04-28: 1 [drp] via OPHTHALMIC

## 2017-04-28 MED ORDER — TETRACAINE HCL 0.5 % OP SOLN
1.0000 [drp] | Freq: Once | OPHTHALMIC | Status: DC
Start: 2017-04-28 — End: 2017-04-28

## 2017-04-28 MED ORDER — KETOROLAC TROMETHAMINE 0.5 % OP SOLN: OPHTHALMIC | 0 refills | Status: DC

## 2017-04-28 MED ORDER — MOXIFLOXACIN HCL 0.5 % OP SOLN: 1.0000 [drp] | Freq: Three times a day (TID) | OPHTHALMIC | 0 refills | Status: DC

## 2017-04-28 NOTE — Discharge Instructions (Signed)
Use the Vigamox as directed and try the ketorolac eyedrops. Do not rub your eyes. You may use Systane or artifical tears as much as you want to for comfort. Wear sunglasses if lights are hurting your eyes.If you wear contact lenses, do not use them until your eye caregiver approves.  Follow-up care is necessary if you are not getting better in 3 days. See your caregiver or eye specialist as suggested for followup  Go to www.goodrx.com to look up your medications. This will give you a list of where you can find your prescriptions at the most affordable prices. Or ask the pharmacist what the cash price is, or if they have any other discount programs available to help make your medication more affordable. This can be less expensive than what you would pay with insurance.

## 2017-04-28 NOTE — ED Triage Notes (Signed)
PT has left eye redness and itching for four days.

## 2017-04-28 NOTE — ED Provider Notes (Signed)
HPI  SUBJECTIVE:  Renee Mcdowell is a 21 y.o. female who presents with 4 days of left eye redness, irritation. She reports increased tearing, crusting the morning, itchy, burning throbbing eye pain. She reports mild intermittent diffuse headaches consistent with previous tension headaches. She also reports intermittent blurry vision that clears with blinking, and intermittently swollen eyelids. Reports mild photophobia. She states initially she had a foreign body sensation in her eye but this cleared. She denies nausea, vomiting, fevers, left-sided headaches. No double vision. No erythema or swelling around the eye. No halos around lights, pain worse in the dark, pain with extraocular movements, trauma to the eye or chemical exposure no contacts with similar symptoms. She tried an unknown eyedrop without improvement in her symptoms. There are no other aggravating or alleviating factors. She does not wear contacts. She wears glasses for driving at night. Past medical history negative for diabetes, hypertension, sickle cell disease, glaucoma. LMP: 8/2. Denies possibility pregnant. VHQ:IONGEXB, Chrissie Noa, MD     Past Medical History:  Diagnosis Date  . ADHD (attention deficit hyperactivity disorder)   . Anxiety   . Depression     History reviewed. No pertinent surgical history.  Family History  Problem Relation Age of Onset  . Mental illness Father        Schizoaffective disorder  . Thyroid disease Mother   . Valvular heart disease Mother     Social History  Substance Use Topics  . Smoking status: Current Every Day Smoker    Packs/day: 0.10    Types: Cigarettes  . Smokeless tobacco: Never Used  . Alcohol use 1.2 oz/week    2 Glasses of wine per week    No current facility-administered medications for this encounter.   Current Outpatient Prescriptions:  .  amphetamine-dextroamphetamine (ADDERALL XR) 20 MG 24 hr capsule, Take 20 mg by mouth daily., Disp: , Rfl:  .   amphetamine-dextroamphetamine (ADDERALL) 5 MG tablet, Take 5 mg by mouth daily., Disp: , Rfl:  .  bacitracin ointment, Apply topically 2 (two) times daily., Disp: 120 g, Rfl: 0 .  escitalopram (LEXAPRO) 10 MG tablet, Take 1 tablet (10 mg total) by mouth daily., Disp: 30 tablet, Rfl: 0 .  hydrOXYzine (ATARAX/VISTARIL) 25 MG tablet, Take 1 tablet (25 mg total) by mouth every 6 (six) hours as needed for anxiety., Disp: 30 tablet, Rfl: 0 .  ketorolac (ACULAR) 0.5 % ophthalmic solution, 1 drop in affected eye 4 times a day x 5 days, Disp: 3 mL, Rfl: 0 .  moxifloxacin (VIGAMOX) 0.5 % ophthalmic solution, Place 1 drop into both eyes 3 (three) times daily. X 7 days, Disp: 3 mL, Rfl: 0 .  nicotine (NICODERM CQ - DOSED IN MG/24 HOURS) 21 mg/24hr patch, Place 1 patch (21 mg total) onto the skin daily., Disp: 28 patch, Rfl: 0 .  traZODone (DESYREL) 50 MG tablet, Take 1 tablet (50 mg total) by mouth at bedtime as needed for sleep., Disp: 30 tablet, Rfl: 0  Allergies  Allergen Reactions  . Zithromax [Azithromycin] Hives     ROS  As noted in HPI.   Physical Exam  BP 105/60   Pulse 74   Temp 98.6 F (37 C) (Oral)   Resp 16   Ht 5\' 5"  (1.651 m)   Wt 120 lb (54.4 kg)   LMP 04/07/2017   SpO2 100%   BMI 19.97 kg/m   Constitutional: Well developed, well nourished, no acute distress Eyes:  EOMI, positive intense conjunctival injection left eye. No  direct or consensual photophobia. No eyelid swelling. No periorbital erythema or swelling. No foreign body seen on lid eversion. No pain with extraocular movements. Cornea clear. No abrasion seen on flourescin exam. No hyphema.     Visual Acuity  Right Eye Distance: 20/50 Left Eye Distance: 20/30 Bilateral Distance: 20/30  Right Eye Near:   Left Eye Near:    Bilateral Near:    HENT: Normocephalic, atraumatic,mucus membranes moist Respiratory: Normal inspiratory effort Cardiovascular: Normal rate GI: nondistended skin: No rash, skin  intact Musculoskeletal: no deformities Neurologic: Alert & oriented x 3, no focal neuro deficits Psychiatric: Speech and behavior appropriate   ED Course   Medications  tetracaine (PONTOCAINE) 0.5 % ophthalmic solution 1-2 drop (1 drop Left Eye Given 04/28/17 1631)    Orders Placed This Encounter  Procedures  . Visual acuity screening    Standing Status:   Standing    Number of Occurrences:   1    No results found for this or any previous visit (from the past 24 hour(s)). No results found.  ED Clinical Impression  Acute conjunctivitis of left eye, unspecified acute conjunctivitis type   ED Assessment/Plan  Visual acuity in the left eye better than the right.  Doubt iritis in the absence of direct or consensual photophobia although this is in the differential. Doubt glaucoma. We'll treat as a infectious conjunctivitis with some Vigamox. We'll also add some ketorolac eyedrops just in case this is an inflammatory process. We'll have her follow up with Dr. Sherryll Burger, ophthalmology on-call if she is not better after being on the antibiotics in 72 hours.  Discussed MDM, plan and followup with patient  Discussed sn/sx that should prompt return to the ED. Patient agrees with plan.   Meds ordered this encounter  Medications  . DISCONTD: tetracaine (PONTOCAINE) 0.5 % ophthalmic solution 1-2 drop  . tetracaine (PONTOCAINE) 0.5 % ophthalmic solution 1-2 drop  . ketorolac (ACULAR) 0.5 % ophthalmic solution    Sig: 1 drop in affected eye 4 times a day x 5 days    Dispense:  3 mL    Refill:  0  . moxifloxacin (VIGAMOX) 0.5 % ophthalmic solution    Sig: Place 1 drop into both eyes 3 (three) times daily. X 7 days    Dispense:  3 mL    Refill:  0    *This clinic note was created using Scientist, clinical (histocompatibility and immunogenetics). Therefore, there may be occasional mistakes despite careful proofreading.  ?   Domenick Gong, MD 04/28/17 [phone]

## 2017-07-20 ENCOUNTER — Encounter (HOSPITAL_COMMUNITY): Payer: Self-pay | Admitting: Emergency Medicine

## 2017-07-20 ENCOUNTER — Ambulatory Visit (HOSPITAL_COMMUNITY)
Admission: EM | Admit: 2017-07-20 | Discharge: 2017-07-20 | Disposition: A | Discharge status: Home / Self Care | Payer: BLUE CROSS/BLUE SHIELD | Attending: Family Medicine | Admitting: Family Medicine

## 2017-07-20 ENCOUNTER — Other Ambulatory Visit: Payer: Self-pay

## 2017-07-20 DIAGNOSIS — L309 Dermatitis, unspecified: Secondary | ICD-10-CM

## 2017-07-20 MED ORDER — PREDNISONE 10 MG (21) PO TBPK: ORAL_TABLET | ORAL | 0 refills | Status: DC

## 2017-07-20 MED ORDER — PERMETHRIN 5 % EX CREA
TOPICAL_CREAM | CUTANEOUS | 1 refills | Status: DC
Start: 2017-07-20 — End: 2019-11-28

## 2017-07-20 NOTE — ED Triage Notes (Signed)
Pt c/o cold symptoms and rash on bilateral arms, skin peeling. Itching.

## 2017-07-20 NOTE — ED Triage Notes (Signed)
Pt states she found a tampon inside her, her period was two weeks ago.

## 2017-07-25 NOTE — ED Provider Notes (Signed)
  [facility]   [id number] 07/20/17 Arrival Time: 1649  ASSESSMENT & PLAN:  1. Dermatitis     Meds ordered this encounter  Medications  . permethrin (ELIMITE) 5 % cream    Sig: Apply from neck down before bed then wash off in the morning. May repeat in one week.    Dispense:  60 g    Refill:  1  . predniSONE (STERAPRED UNI-PAK 21 TAB) 10 MG (21) TBPK tablet    Sig: Take as directed.    Dispense:  21 tablet    Refill:  0   Unsure of exact etiology. She has some concern over possibility of scabies. Will empirically treat. Short trial of prednisone. F/U in several days if not showing improvement.  Reviewed expectations re: course of current medical issues. Questions answered. Outlined signs and symptoms indicating need for more acute intervention. Patient verbalized understanding. After Visit Summary given.   SUBJECTIVE:  Renee Mcdowell is a 21 y.o. female who presents with complaint of a rash first noticed over the past week or two. On bilateral arms. Itching. Skin peels at times. No h/o similar. No new exposures/contacts reported. OTC cream without change. Afebrile. No recent travel or outside exposure.  ROS: As per HPI.  OBJECTIVE: Vitals:   07/20/17 1706  BP: 134/76  Pulse: 99  Resp: 18  Temp: 98.2 F (36.8 C)  SpO2: 100%    General appearance: alert; no distress Lungs: clear to auscultation bilaterally Heart: regular rate and rhythm Extremities: no edema Skin: warm and dry; scattered areas of skin irritation/dry skin with a few raised papules; several of areas with excoriations; no sign of infection Psychological: alert and cooperative; normal mood and affect   Allergies  Allergen Reactions  . Zithromax [Azithromycin] Hives    Past Medical History:  Diagnosis Date  . ADHD (attention deficit hyperactivity disorder)   . Anxiety   . Depression    Social History   Socioeconomic History  . Marital status: Single    Spouse name: Not on  file  . Number of children: Not on file  . Years of education: Not on file  . Highest education level: Not on file  Social Needs  . Financial resource strain: Not on file  . Food insecurity - worry: Not on file  . Food insecurity - inability: Not on file  . Transportation needs - medical: Not on file  . Transportation needs - non-medical: Not on file  Occupational History  . Not on file  Tobacco Use  . Smoking status: Current Every Day Smoker    Packs/day: 0.10    Types: Cigarettes  . Smokeless tobacco: Never Used  Substance and Sexual Activity  . Alcohol use: Yes    Alcohol/week: 1.2 oz    Types: 2 Glasses of wine per week  . Drug use: No  . Sexual activity: Yes    Birth control/protection: Implant  Other Topics Concern  . Not on file  Social History Narrative  . Not on file   Family History  Problem Relation Age of Onset  . Mental illness Father        Schizoaffective disorder  . Thyroid disease Mother   . Valvular heart disease Mother       Mardella LaymanHagler, Renee Morss, MD 07/25/17 [phone]

## 2017-08-24 DIAGNOSIS — F900 Attention-deficit hyperactivity disorder, predominantly inattentive type: Secondary | ICD-10-CM | POA: Diagnosis not present

## 2017-08-24 DIAGNOSIS — F331 Major depressive disorder, recurrent, moderate: Secondary | ICD-10-CM | POA: Diagnosis not present

## 2018-01-25 DIAGNOSIS — F331 Major depressive disorder, recurrent, moderate: Secondary | ICD-10-CM | POA: Diagnosis not present

## 2018-01-25 DIAGNOSIS — F900 Attention-deficit hyperactivity disorder, predominantly inattentive type: Secondary | ICD-10-CM | POA: Diagnosis not present

## 2018-01-25 DIAGNOSIS — F4310 Post-traumatic stress disorder, unspecified: Secondary | ICD-10-CM | POA: Diagnosis not present

## 2018-04-10 IMAGING — DX DG RIBS W/ CHEST 3+V*R*
3 series · 3 of 3 positions shown · non-contrast
Comparison: None.

CLINICAL DATA: Acute right chest and rib pain following fall 1 week
ago. Initial encounter.

EXAM:
RIGHT RIBS AND CHEST - 3+ VIEW

[chest pa]
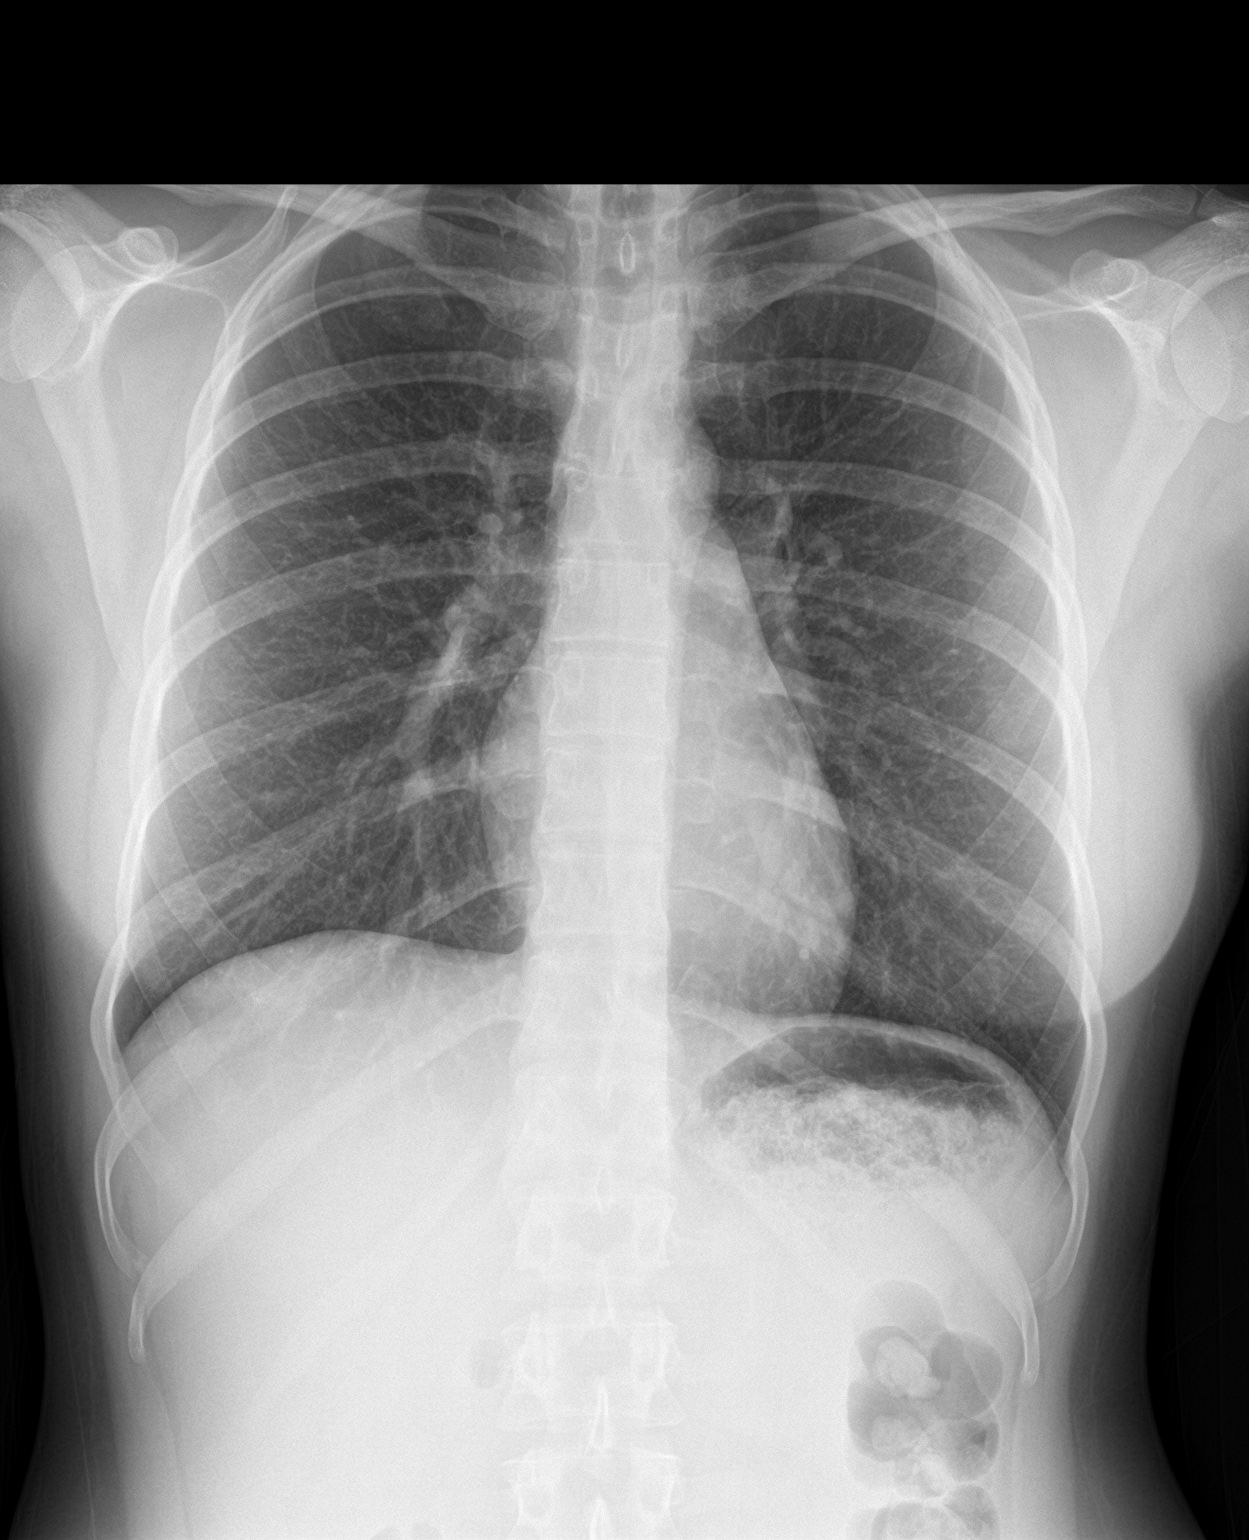

[rib pa]
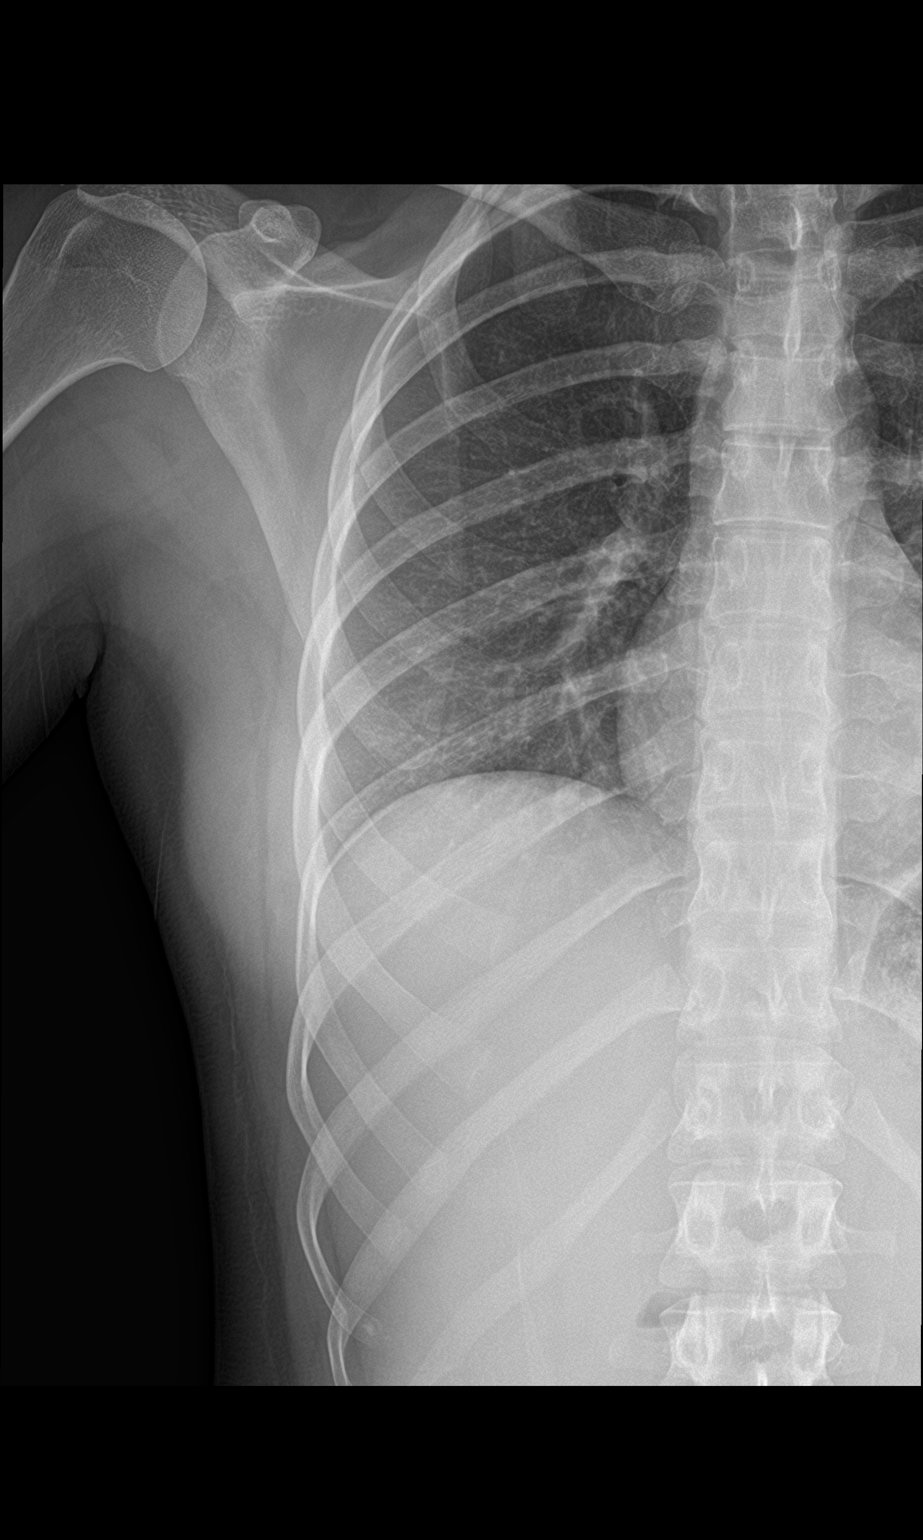

[rib obl]
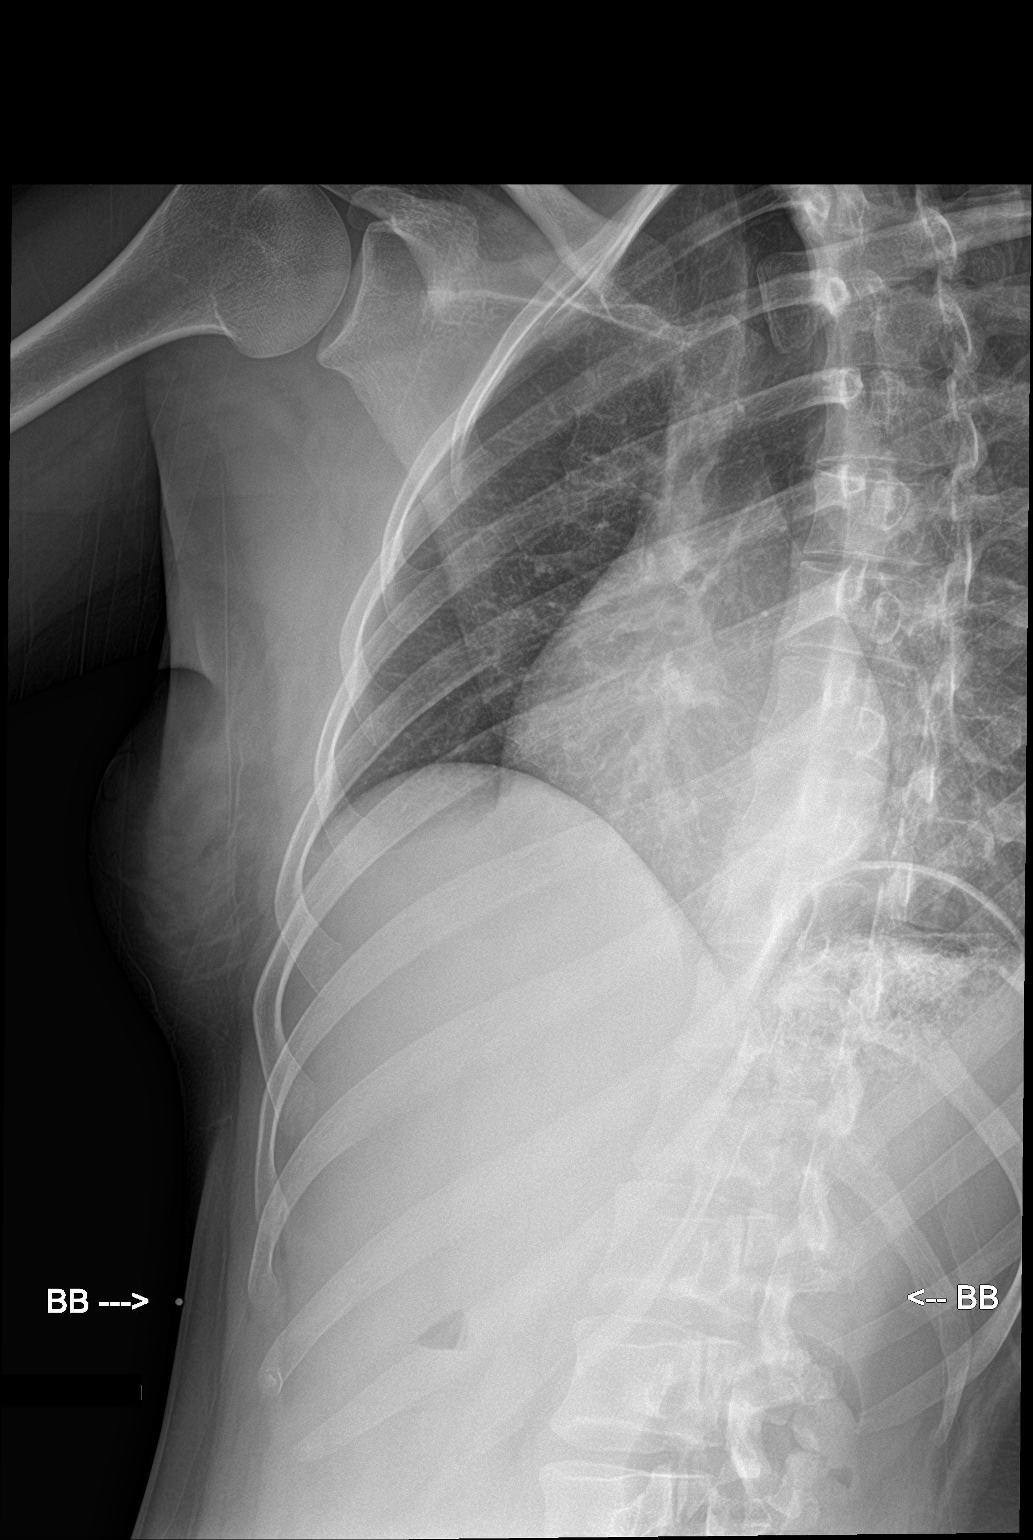

[3 of 3 positions shown; findings below may reference images not displayed]

FINDINGS: No fracture or other bone lesions are seen involving the ribs. There
is no evidence of pneumothorax or pleural effusion. Both lungs are
clear. Heart size and mediastinal contours are within normal limits.
IMPRESSION: Negative.

## 2018-05-03 DIAGNOSIS — F411 Generalized anxiety disorder: Secondary | ICD-10-CM | POA: Diagnosis not present

## 2018-05-03 DIAGNOSIS — F900 Attention-deficit hyperactivity disorder, predominantly inattentive type: Secondary | ICD-10-CM | POA: Diagnosis not present

## 2018-05-03 DIAGNOSIS — F3342 Major depressive disorder, recurrent, in full remission: Secondary | ICD-10-CM | POA: Diagnosis not present

## 2018-11-01 DIAGNOSIS — F331 Major depressive disorder, recurrent, moderate: Secondary | ICD-10-CM | POA: Diagnosis not present

## 2018-11-27 DIAGNOSIS — R7309 Other abnormal glucose: Secondary | ICD-10-CM | POA: Diagnosis not present

## 2018-11-27 DIAGNOSIS — Z79899 Other long term (current) drug therapy: Secondary | ICD-10-CM | POA: Diagnosis not present

## 2018-11-27 DIAGNOSIS — Z1329 Encounter for screening for other suspected endocrine disorder: Secondary | ICD-10-CM | POA: Diagnosis not present

## 2018-12-13 DIAGNOSIS — F4323 Adjustment disorder with mixed anxiety and depressed mood: Secondary | ICD-10-CM | POA: Diagnosis not present

## 2018-12-18 DIAGNOSIS — F4323 Adjustment disorder with mixed anxiety and depressed mood: Secondary | ICD-10-CM | POA: Diagnosis not present

## 2018-12-21 DIAGNOSIS — F3342 Major depressive disorder, recurrent, in full remission: Secondary | ICD-10-CM | POA: Diagnosis not present

## 2018-12-21 DIAGNOSIS — F900 Attention-deficit hyperactivity disorder, predominantly inattentive type: Secondary | ICD-10-CM | POA: Diagnosis not present

## 2018-12-21 DIAGNOSIS — F4323 Adjustment disorder with mixed anxiety and depressed mood: Secondary | ICD-10-CM | POA: Diagnosis not present

## 2018-12-25 DIAGNOSIS — F4323 Adjustment disorder with mixed anxiety and depressed mood: Secondary | ICD-10-CM | POA: Diagnosis not present

## 2018-12-28 DIAGNOSIS — F4323 Adjustment disorder with mixed anxiety and depressed mood: Secondary | ICD-10-CM | POA: Diagnosis not present

## 2019-01-04 DIAGNOSIS — F4323 Adjustment disorder with mixed anxiety and depressed mood: Secondary | ICD-10-CM | POA: Diagnosis not present

## 2019-01-08 DIAGNOSIS — F4323 Adjustment disorder with mixed anxiety and depressed mood: Secondary | ICD-10-CM | POA: Diagnosis not present

## 2019-01-11 DIAGNOSIS — F4323 Adjustment disorder with mixed anxiety and depressed mood: Secondary | ICD-10-CM | POA: Diagnosis not present

## 2019-01-15 DIAGNOSIS — F4323 Adjustment disorder with mixed anxiety and depressed mood: Secondary | ICD-10-CM | POA: Diagnosis not present

## 2019-01-18 DIAGNOSIS — F4323 Adjustment disorder with mixed anxiety and depressed mood: Secondary | ICD-10-CM | POA: Diagnosis not present

## 2019-01-22 DIAGNOSIS — F4323 Adjustment disorder with mixed anxiety and depressed mood: Secondary | ICD-10-CM | POA: Diagnosis not present

## 2019-01-25 DIAGNOSIS — F4323 Adjustment disorder with mixed anxiety and depressed mood: Secondary | ICD-10-CM | POA: Diagnosis not present

## 2019-01-30 DIAGNOSIS — F4323 Adjustment disorder with mixed anxiety and depressed mood: Secondary | ICD-10-CM | POA: Diagnosis not present

## 2019-02-01 DIAGNOSIS — F4323 Adjustment disorder with mixed anxiety and depressed mood: Secondary | ICD-10-CM | POA: Diagnosis not present

## 2019-02-08 DIAGNOSIS — F4323 Adjustment disorder with mixed anxiety and depressed mood: Secondary | ICD-10-CM | POA: Diagnosis not present

## 2019-02-12 DIAGNOSIS — F4323 Adjustment disorder with mixed anxiety and depressed mood: Secondary | ICD-10-CM | POA: Diagnosis not present

## 2019-02-19 DIAGNOSIS — F4310 Post-traumatic stress disorder, unspecified: Secondary | ICD-10-CM | POA: Diagnosis not present

## 2019-02-22 DIAGNOSIS — F4310 Post-traumatic stress disorder, unspecified: Secondary | ICD-10-CM | POA: Diagnosis not present

## 2019-02-28 DIAGNOSIS — F4310 Post-traumatic stress disorder, unspecified: Secondary | ICD-10-CM | POA: Diagnosis not present

## 2019-03-01 DIAGNOSIS — F4310 Post-traumatic stress disorder, unspecified: Secondary | ICD-10-CM | POA: Diagnosis not present

## 2019-03-05 DIAGNOSIS — F4310 Post-traumatic stress disorder, unspecified: Secondary | ICD-10-CM | POA: Diagnosis not present

## 2019-03-08 DIAGNOSIS — F4310 Post-traumatic stress disorder, unspecified: Secondary | ICD-10-CM | POA: Diagnosis not present

## 2019-03-12 DIAGNOSIS — F900 Attention-deficit hyperactivity disorder, predominantly inattentive type: Secondary | ICD-10-CM | POA: Diagnosis not present

## 2019-03-12 DIAGNOSIS — F4310 Post-traumatic stress disorder, unspecified: Secondary | ICD-10-CM | POA: Diagnosis not present

## 2019-03-12 DIAGNOSIS — F3342 Major depressive disorder, recurrent, in full remission: Secondary | ICD-10-CM | POA: Diagnosis not present

## 2019-03-15 DIAGNOSIS — F4310 Post-traumatic stress disorder, unspecified: Secondary | ICD-10-CM | POA: Diagnosis not present

## 2019-03-19 DIAGNOSIS — F4310 Post-traumatic stress disorder, unspecified: Secondary | ICD-10-CM | POA: Diagnosis not present

## 2019-03-22 DIAGNOSIS — F4310 Post-traumatic stress disorder, unspecified: Secondary | ICD-10-CM | POA: Diagnosis not present

## 2019-03-26 DIAGNOSIS — F4310 Post-traumatic stress disorder, unspecified: Secondary | ICD-10-CM | POA: Diagnosis not present

## 2019-04-09 DIAGNOSIS — F4310 Post-traumatic stress disorder, unspecified: Secondary | ICD-10-CM | POA: Diagnosis not present

## 2019-04-13 DIAGNOSIS — Z20828 Contact with and (suspected) exposure to other viral communicable diseases: Secondary | ICD-10-CM | POA: Diagnosis not present

## 2019-04-16 DIAGNOSIS — F4310 Post-traumatic stress disorder, unspecified: Secondary | ICD-10-CM | POA: Diagnosis not present

## 2019-04-19 DIAGNOSIS — F4310 Post-traumatic stress disorder, unspecified: Secondary | ICD-10-CM | POA: Diagnosis not present

## 2019-04-23 DIAGNOSIS — F4310 Post-traumatic stress disorder, unspecified: Secondary | ICD-10-CM | POA: Diagnosis not present

## 2019-05-03 DIAGNOSIS — F4310 Post-traumatic stress disorder, unspecified: Secondary | ICD-10-CM | POA: Diagnosis not present

## 2019-05-07 DIAGNOSIS — F4310 Post-traumatic stress disorder, unspecified: Secondary | ICD-10-CM | POA: Diagnosis not present

## 2019-05-17 DIAGNOSIS — F4310 Post-traumatic stress disorder, unspecified: Secondary | ICD-10-CM | POA: Diagnosis not present

## 2019-05-21 DIAGNOSIS — F4310 Post-traumatic stress disorder, unspecified: Secondary | ICD-10-CM | POA: Diagnosis not present

## 2019-05-24 DIAGNOSIS — F4310 Post-traumatic stress disorder, unspecified: Secondary | ICD-10-CM | POA: Diagnosis not present

## 2019-05-28 DIAGNOSIS — F4310 Post-traumatic stress disorder, unspecified: Secondary | ICD-10-CM | POA: Diagnosis not present

## 2019-05-31 DIAGNOSIS — F4310 Post-traumatic stress disorder, unspecified: Secondary | ICD-10-CM | POA: Diagnosis not present

## 2019-06-04 DIAGNOSIS — F4310 Post-traumatic stress disorder, unspecified: Secondary | ICD-10-CM | POA: Diagnosis not present

## 2019-06-07 DIAGNOSIS — F4310 Post-traumatic stress disorder, unspecified: Secondary | ICD-10-CM | POA: Diagnosis not present

## 2019-06-18 DIAGNOSIS — F4310 Post-traumatic stress disorder, unspecified: Secondary | ICD-10-CM | POA: Diagnosis not present

## 2019-06-28 DIAGNOSIS — F4310 Post-traumatic stress disorder, unspecified: Secondary | ICD-10-CM | POA: Diagnosis not present

## 2019-07-02 DIAGNOSIS — F4310 Post-traumatic stress disorder, unspecified: Secondary | ICD-10-CM | POA: Diagnosis not present

## 2019-07-05 DIAGNOSIS — F900 Attention-deficit hyperactivity disorder, predominantly inattentive type: Secondary | ICD-10-CM | POA: Diagnosis not present

## 2019-07-05 DIAGNOSIS — F4310 Post-traumatic stress disorder, unspecified: Secondary | ICD-10-CM | POA: Diagnosis not present

## 2019-07-12 DIAGNOSIS — F4310 Post-traumatic stress disorder, unspecified: Secondary | ICD-10-CM | POA: Diagnosis not present

## 2019-07-16 DIAGNOSIS — F4310 Post-traumatic stress disorder, unspecified: Secondary | ICD-10-CM | POA: Diagnosis not present

## 2019-07-23 DIAGNOSIS — Z3046 Encounter for surveillance of implantable subdermal contraceptive: Secondary | ICD-10-CM | POA: Diagnosis not present

## 2019-07-23 DIAGNOSIS — Z1151 Encounter for screening for human papillomavirus (HPV): Secondary | ICD-10-CM | POA: Diagnosis not present

## 2019-07-23 DIAGNOSIS — Z01419 Encounter for gynecological examination (general) (routine) without abnormal findings: Secondary | ICD-10-CM | POA: Diagnosis not present

## 2019-07-23 DIAGNOSIS — Z6826 Body mass index (BMI) 26.0-26.9, adult: Secondary | ICD-10-CM | POA: Diagnosis not present

## 2019-08-09 DIAGNOSIS — F4310 Post-traumatic stress disorder, unspecified: Secondary | ICD-10-CM | POA: Diagnosis not present

## 2019-08-20 DIAGNOSIS — F4310 Post-traumatic stress disorder, unspecified: Secondary | ICD-10-CM | POA: Diagnosis not present

## 2019-08-23 DIAGNOSIS — F4310 Post-traumatic stress disorder, unspecified: Secondary | ICD-10-CM | POA: Diagnosis not present

## 2019-08-27 DIAGNOSIS — F4310 Post-traumatic stress disorder, unspecified: Secondary | ICD-10-CM | POA: Diagnosis not present

## 2019-09-10 DIAGNOSIS — F4310 Post-traumatic stress disorder, unspecified: Secondary | ICD-10-CM | POA: Diagnosis not present

## 2019-09-13 DIAGNOSIS — F4310 Post-traumatic stress disorder, unspecified: Secondary | ICD-10-CM | POA: Diagnosis not present

## 2019-09-17 DIAGNOSIS — F4310 Post-traumatic stress disorder, unspecified: Secondary | ICD-10-CM | POA: Diagnosis not present

## 2019-10-01 DIAGNOSIS — F4310 Post-traumatic stress disorder, unspecified: Secondary | ICD-10-CM | POA: Diagnosis not present

## 2019-10-04 DIAGNOSIS — F4322 Adjustment disorder with anxiety: Secondary | ICD-10-CM | POA: Diagnosis not present

## 2019-10-04 DIAGNOSIS — F3342 Major depressive disorder, recurrent, in full remission: Secondary | ICD-10-CM | POA: Diagnosis not present

## 2019-10-10 DIAGNOSIS — K006 Disturbances in tooth eruption: Secondary | ICD-10-CM | POA: Diagnosis not present

## 2019-10-10 DIAGNOSIS — K011 Impacted teeth: Secondary | ICD-10-CM | POA: Diagnosis not present

## 2019-10-15 DIAGNOSIS — F4310 Post-traumatic stress disorder, unspecified: Secondary | ICD-10-CM | POA: Diagnosis not present

## 2019-10-18 DIAGNOSIS — F4310 Post-traumatic stress disorder, unspecified: Secondary | ICD-10-CM | POA: Diagnosis not present

## 2019-10-29 DIAGNOSIS — F4310 Post-traumatic stress disorder, unspecified: Secondary | ICD-10-CM | POA: Diagnosis not present

## 2019-10-30 DIAGNOSIS — N926 Irregular menstruation, unspecified: Secondary | ICD-10-CM | POA: Diagnosis not present

## 2019-10-30 DIAGNOSIS — E538 Deficiency of other specified B group vitamins: Secondary | ICD-10-CM | POA: Diagnosis not present

## 2019-10-30 DIAGNOSIS — R6889 Other general symptoms and signs: Secondary | ICD-10-CM | POA: Diagnosis not present

## 2019-10-30 DIAGNOSIS — R569 Unspecified convulsions: Secondary | ICD-10-CM | POA: Diagnosis not present

## 2019-10-31 DIAGNOSIS — R569 Unspecified convulsions: Secondary | ICD-10-CM | POA: Diagnosis not present

## 2019-10-31 DIAGNOSIS — R6889 Other general symptoms and signs: Secondary | ICD-10-CM | POA: Diagnosis not present

## 2019-10-31 DIAGNOSIS — N926 Irregular menstruation, unspecified: Secondary | ICD-10-CM | POA: Diagnosis not present

## 2019-10-31 DIAGNOSIS — E538 Deficiency of other specified B group vitamins: Secondary | ICD-10-CM | POA: Diagnosis not present

## 2019-11-05 DIAGNOSIS — F4310 Post-traumatic stress disorder, unspecified: Secondary | ICD-10-CM | POA: Diagnosis not present

## 2019-11-09 ENCOUNTER — Encounter: Payer: Self-pay | Admitting: Neurology

## 2019-11-12 DIAGNOSIS — F4310 Post-traumatic stress disorder, unspecified: Secondary | ICD-10-CM | POA: Diagnosis not present

## 2019-11-13 DIAGNOSIS — F4322 Adjustment disorder with anxiety: Secondary | ICD-10-CM | POA: Diagnosis not present

## 2019-11-14 ENCOUNTER — Emergency Department (HOSPITAL_COMMUNITY): Payer: BC Managed Care – PPO

## 2019-11-14 ENCOUNTER — Encounter (HOSPITAL_COMMUNITY): Payer: Self-pay | Admitting: Emergency Medicine

## 2019-11-14 ENCOUNTER — Other Ambulatory Visit: Payer: Self-pay

## 2019-11-14 ENCOUNTER — Emergency Department (HOSPITAL_COMMUNITY)
Admission: EM | Admit: 2019-11-14 | Discharge: 2019-11-14 | Disposition: A | Discharge status: Home / Self Care | Payer: BC Managed Care – PPO | Attending: Emergency Medicine | Admitting: Emergency Medicine

## 2019-11-14 DIAGNOSIS — R079 Chest pain, unspecified: Secondary | ICD-10-CM | POA: Diagnosis not present

## 2019-11-14 DIAGNOSIS — Z79899 Other long term (current) drug therapy: Secondary | ICD-10-CM | POA: Diagnosis not present

## 2019-11-14 DIAGNOSIS — F17210 Nicotine dependence, cigarettes, uncomplicated: Secondary | ICD-10-CM | POA: Diagnosis not present

## 2019-11-14 DIAGNOSIS — R21 Rash and other nonspecific skin eruption: Secondary | ICD-10-CM | POA: Diagnosis not present

## 2019-11-14 DIAGNOSIS — F909 Attention-deficit hyperactivity disorder, unspecified type: Secondary | ICD-10-CM | POA: Insufficient documentation

## 2019-11-14 DIAGNOSIS — R0789 Other chest pain: Secondary | ICD-10-CM | POA: Diagnosis not present

## 2019-11-14 DIAGNOSIS — I959 Hypotension, unspecified: Secondary | ICD-10-CM | POA: Insufficient documentation

## 2019-11-14 LAB — BASIC METABOLIC PANEL
Anion gap: 12 (ref 5–15)
BUN: 9 mg/dL (ref 6–20)
CO2: 20 mmol/L — ABNORMAL LOW (ref 22–32)
Calcium: 9.5 mg/dL (ref 8.9–10.3)
Chloride: 108 mmol/L (ref 98–111)
Creatinine, Ser: 1.03 mg/dL — ABNORMAL HIGH (ref 0.44–1.00)
GFR calc Af Amer: >60 mL/min (ref >60)
GFR calc non Af Amer: >60 mL/min (ref >60)
Glucose, Bld: 78 mg/dL (ref 70–99)
Potassium: 3.8 mmol/L (ref 3.5–5.1)
Sodium: 140 mmol/L (ref 135–145)

## 2019-11-14 LAB — URINALYSIS, ROUTINE W REFLEX MICROSCOPIC
Bilirubin Urine: NEGATIVE
Glucose, UA: NEGATIVE mg/dL
Ketones, ur: 80 mg/dL — AB
Nitrite: NEGATIVE
Protein, ur: 30 mg/dL — AB
Specific Gravity, Urine: 1.024 (ref 1.005–1.030)
pH: 6 (ref 5.0–8.0)

## 2019-11-14 LAB — CBC
HCT: 41.4 % (ref 36.0–46.0)
Hemoglobin: 14.4 g/dL (ref 12.0–15.0)
MCH: 30.3 pg (ref 26.0–34.0)
MCHC: 34.8 g/dL (ref 30.0–36.0)
MCV: 87 fL (ref 80.0–100.0)
Platelets: 396 10*3/uL (ref 150–400)
RBC: 4.76 MIL/uL (ref 3.87–5.11)
RDW: 12.9 % (ref 11.5–15.5)
WBC: 8.6 10*3/uL (ref 4.0–10.5)
nRBC: 0 % (ref 0.0–0.2)

## 2019-11-14 LAB — TROPONIN I (HIGH SENSITIVITY): Troponin I (High Sensitivity): <2 ng/L (ref <18)

## 2019-11-14 MED ORDER — CEPHALEXIN 500 MG PO CAPS: 500.0000 mg | ORAL_CAPSULE | Freq: Four times a day (QID) | ORAL | 0 refills | Status: AC

## 2019-11-14 MED ORDER — SODIUM CHLORIDE 0.9% FLUSH: 3.0000 mL | Freq: Once | INTRAVENOUS | Status: DC

## 2019-11-14 NOTE — ED Notes (Signed)
Save blue in main lab 

## 2019-11-14 NOTE — ED Triage Notes (Addendum)
Patient is complaining of rash on fingers and body. She is also complaining of chest pain that has been going on for a week. Patient states that she has been having problems with blood pressure dropping and she has felt dizzy. Patient has multiple complaints.

## 2019-11-14 NOTE — ED Provider Notes (Signed)
[facility]   CSN: [id number] Arrival date & time: 11/14/19  0129     History Chief Complaint  Patient presents with  . Rash  . Hypotension  . Chest Pain    Renee Mcdowell is a 24 y.o. female with PMH/o ADHD, Anxiety, Depression who presents for evaluation of multiple complaints.  Patient reports that about a week ago, she thinks she may have had a seizure.  She states that this was witnessed by her boyfriend and states that maybe noticed some convulsing.  She states she did not remember.  She did not seek evaluation for this.  Patient also reports that about 3 days ago, she had developed some chest pain.  She felt like she could not get a deep breath in.  She described pressure across her chest.  She states no associated diaphoresis, new nausea/vomiting.  She has a chronic baseline of nausea.  Patient states the symptoms lasted for 48 hours and then resolved.  Patient also reports that today, she felt like her blood pressure kept dropping.  She states that she felt lightheaded.  She states that when she got up from her chair, she felt like her feet were uncoordinated and she was taking weird steps.  Patient also reports that she has a rash noted to her fingers, particularly her left index and thumb.  She states that it is irritating, painful.  She feels like the rash has spread up to her head and to her legs.  Nobody else in the house has similar rash.  No new lotions, soaps, detergents.  Patient also reports that while she was in the waiting room, she developed some sharp chest pain to the left side again.  She states that this is currently resolved.  She currently denies any fevers, vision changes, numbness/weakness of her arms or legs, chest pain, difficulty breathing, abdominal pain, vomiting.  She does not smoke.  She denies any illicit drug use.  No personal cardiac history.  No family history of MI before the age of 26. She denies any  OCP use, recent immobilization, prior history of DVT/PE, recent surgery, leg swelling, or long travel.  The history is provided by the patient.       Past Medical History:  Diagnosis Date  . ADHD (attention deficit hyperactivity disorder)   . Anxiety   . Depression     Patient Active Problem List   Diagnosis Date Noted  . Major depressive disorder, recurrent severe without psychotic features (HCC) 12/26/2016  . PTSD (post-traumatic stress disorder) 12/26/2016  . Vitamin B12 deficiency 11/21/2015  . Social phobia 11/19/2015  . Tobacco use disorder 11/19/2015  . Alcohol use disorder, mild, abuse 11/19/2015  . Attention deficit hyperactivity disorder (ADHD) 11/19/2015    History reviewed. No pertinent surgical history.   OB History   No obstetric history on file.     Family History  Problem Relation Age of Onset  . Mental illness Father        Schizoaffective disorder  . Thyroid disease Mother   . Valvular heart disease Mother     Social History   Tobacco Use  . Smoking status: Current Every Day Smoker    Packs/day: 0.10    Types: Cigarettes  . Smokeless tobacco: Never Used  Substance Use Topics  . Alcohol use: Yes    Alcohol/week: 2.0 standard drinks    Types: 2 Glasses of wine per week  . Drug use: No  Home Medications Prior to Admission medications   Medication Sig Start Date End Date Taking? Authorizing Provider  amphetamine-dextroamphetamine (ADDERALL XR) 20 MG 24 hr capsule Take 20 mg by mouth daily.   Yes [provider]  amphetamine-dextroamphetamine (ADDERALL) 5 MG tablet Take 5 mg by mouth daily.   Yes [provider]  TRINTELLIX 20 MG TABS tablet Take 20 mg by mouth daily. 10/24/19  Yes [provider]  bacitracin ointment Apply topically 2 (two) times daily. Patient not taking: Reported on 11/14/2019 12/31/16   Oneta Rack, NP  cephALEXin (KEFLEX) 500 MG capsule Take 1 capsule (500 mg total) by mouth 4 (four) times  daily for 7 days. 11/14/19 11/21/19  Maxwell Caul, PA-C  escitalopram (LEXAPRO) 10 MG tablet Take 1 tablet (10 mg total) by mouth daily. Patient not taking: Reported on 11/14/2019 01/01/17   Oneta Rack, NP  hydrOXYzine (ATARAX/VISTARIL) 25 MG tablet Take 1 tablet (25 mg total) by mouth every 6 (six) hours as needed for anxiety. Patient not taking: Reported on 11/14/2019 12/31/16   Oneta Rack, NP  ketorolac (ACULAR) 0.5 % ophthalmic solution 1 drop in affected eye 4 times a day x 5 days Patient not taking: Reported on 11/14/2019 04/28/17   Domenick Gong, MD  moxifloxacin (VIGAMOX) 0.5 % ophthalmic solution Place 1 drop into both eyes 3 (three) times daily. X 7 days Patient not taking: Reported on 11/14/2019 04/28/17   Domenick Gong, MD  nicotine (NICODERM CQ - DOSED IN MG/24 HOURS) 21 mg/24hr patch Place 1 patch (21 mg total) onto the skin daily. Patient not taking: Reported on 11/14/2019 01/01/17   Oneta Rack, NP  permethrin (ELIMITE) 5 % cream Apply from neck down before bed then wash off in the morning. May repeat in one week. Patient not taking: Reported on 11/14/2019 07/20/17   Mardella Layman, MD  predniSONE (STERAPRED UNI-PAK 21 TAB) 10 MG (21) TBPK tablet Take as directed. Patient not taking: Reported on 11/14/2019 07/20/17   Mardella Layman, MD  traZODone (DESYREL) 50 MG tablet Take 1 tablet (50 mg total) by mouth at bedtime as needed for sleep. Patient not taking: Reported on 11/14/2019 12/31/16   Oneta Rack, NP    Allergies    Azithromycin  Review of Systems   Review of Systems  Constitutional: Negative for fever.  Eyes: Negative for visual disturbance.  Respiratory: Negative for cough and shortness of breath.   Cardiovascular: Positive for chest pain.  Gastrointestinal: Negative for abdominal pain, nausea and vomiting.  Genitourinary: Negative for dysuria and hematuria.  Skin: Positive for rash and wound.  Neurological: Negative for weakness, numbness and  headaches.  All other systems reviewed and are negative.   Physical Exam Updated Vital Signs BP 120/75 (BP Location: Right Arm)   Pulse 88   Temp 97.8 F (36.6 C) (Oral)   Resp 13   Ht 5\' 5"  (1.651 m)   Wt 72.6 kg   LMP 11/14/2019   SpO2 100%   BMI 26.63 kg/m   Physical Exam Vitals and nursing note reviewed.  Constitutional:      Appearance: Normal appearance. She is well-developed.  HENT:     Head: Normocephalic and atraumatic.  Eyes:     General: Lids are normal.     Conjunctiva/sclera: Conjunctivae normal.     Pupils: Pupils are equal, round, and reactive to light.     Comments: PERRL. EOMs intact. No nystagmus. No neglect.   Neck:     Comments:  Neck is supple and without rigidity.  Full range of motion without any difficulty. Cardiovascular:     Rate and Rhythm: Normal rate and regular rhythm.     Pulses: Normal pulses.     Heart sounds: Normal heart sounds. No murmur. No friction rub. No gallop.   Pulmonary:     Effort: Pulmonary effort is normal.     Breath sounds: Normal breath sounds.     Comments: Lungs clear to auscultation bilaterally.  Symmetric chest rise.  No wheezing, rales, rhonchi. Abdominal:     Palpations: Abdomen is soft. Abdomen is not rigid.     Tenderness: There is no abdominal tenderness. There is no guarding.     Comments: Abdomen is soft, non-distended, non-tender. No rigidity, No guarding. No peritoneal signs.  Musculoskeletal:        General: Normal range of motion.     Cervical back: Full passive range of motion without pain.  Skin:    General: Skin is warm and dry.     Capillary Refill: Capillary refill takes less than 2 seconds.     Comments: Left index finger appears to have area of erythema, irritation noted of the lateral nail bed that could be an early paronychia versus infection.  She has scattered area of erythema, abrasion noted to left thumb.  She has scattered areas of erythema, scabbed over lesions noted to the anterior medial  aspect of her left lower leg.  She has areas of erythema noted to the scalp, eyebrows.  She does feel like the area to the eyebrows is not new and has had before.  No evidence of petechiae.  No rash noted to palms or soles.  Neurological:     Mental Status: She is alert and oriented to person, place, and time.     Comments: Cranial nerves III-XII intact Follows commands, Moves all extremities  5/5 strength to BUE and BLE  Sensation intact throughout all major nerve distributions. No gait abnormalities  No slurred speech. No facial droop.   Psychiatric:        Speech: Speech normal.     ED Results / Procedures / Treatments   Labs (all labs ordered are listed, but only abnormal results are displayed) Labs Reviewed  BASIC METABOLIC PANEL - Abnormal; Notable for the following components:      Result Value   CO2 20 (*)    Creatinine, Ser 1.03 (*)    All other components within normal limits  URINALYSIS, ROUTINE W REFLEX MICROSCOPIC - Abnormal; Notable for the following components:   Color, Urine AMBER (*)    APPearance CLOUDY (*)    Hgb urine dipstick LARGE (*)    Ketones, ur 80 (*)    Protein, ur 30 (*)    Leukocytes,Ua SMALL (*)    Bacteria, UA RARE (*)    All other components within normal limits  CBC  HCG, QUANTITATIVE, PREGNANCY  TROPONIN I (HIGH SENSITIVITY)    EKG None  EKG shows sinus tach at 121, PR of 86, QTc of 456.  No evidence of ST elevation.   Radiology DG Chest 2 View  Result Date: 11/14/2019 CLINICAL DATA:  24 year old female with chest pain, shortness of breath, rash. EXAM: CHEST - 2 VIEW COMPARISON:  Chest radiographs 04/14/2017. FINDINGS: Semi upright AP and lateral views. Normal lung volumes and mediastinal contours. Visualized tracheal air column is within normal limits. Both lungs appear clear. No pneumothorax or pleural effusion. No osseous abnormality identified. Visible bowel-gas pattern within normal  limits. IMPRESSION: Negative.  No cardiopulmonary  abnormality. Electronically Signed   By: Odessa Fleming M.D.   On: 11/14/2019 02:58    Procedures Procedures (including critical care time)  Medications Ordered in ED Medications - No data to display  ED Course  I have reviewed the triage vital signs and the nursing notes.  Pertinent labs & imaging results that were available during my care of the patient were reviewed by me and considered in my medical decision making (see chart for details).    MDM Rules/Calculators/A&P                      24 year old female who presents for evaluation of multiple complaints.  States that she thinks she may have had a seizure about a week ago.  Did not follow-up.  Additionally, today felt like she was having drops in her blood pressure and felt like she was having difficulty coordinating her feet.  Also reports chest pain that occurred a couple days ago though she did have one brief episode here in the ED.  Also reports rash to fingers that has spread to head, legs.  No fevers, abdominal pain, vomiting.  On initial ED arrival, she is afebrile, nontoxic-appearing.  Vital signs are stable.  Her symptoms have completely resolved.  No neuro deficits noted on my exam.  Normal gait.  She does have what looks to be an early paronychia versus infection noted to fingers.  No evidence of petechiae.  No rash on palms or soles.  History/physical exam not concerning for SJS, TENS, syphilis, scabies, bedbugs, shingles.  Doubt ACS etiology as her story sounds atypical and she does not have any risk factors.  Additionally, she is asymptomatic at this time.  Additionally, history/physical exam is a concerning for PE. Patient is PERC negative and is low risk for PE.    BMP shows normal BUN.  Creatinine is 1.03.  UA shows large hemoglobin, small leukocytes, pyuria.  There is squamous epithelium so question if this is contaminant. Trop is negative.  CBC shows no leukocytosis or anemia.  Chest x-ray negative for any acute infectious  etiology.  I discussed with Dr. Bebe Shaggy who independently evaluated patient.  At this time, her history/physical exam is not consistent or concerning for ACS etiology.  Additionally, no other concerns regarding neuro.  When she discussed with Dr. Bebe Shaggy, she said that her boyfriend thought it may be more shaking from possible syncopal episode rather than true seizure.  At this time, she has no neuro deficits.  No indication for CT head.  History/physical exam not concerning for CVA, intracranial hemorrhage, meningitis.  Patient has follow-up with lumbar neuro.  We will plan to start patient on antibiotics given rash on hands that is going to legs.  I updated patient on plan.  She is agreeable.  She has history of allergy to azithromycin but otherwise no other allergies. At this time, patient exhibits no emergent life-threatening condition that require further evaluation in ED or admission. Patient had ample opportunity for questions and discussion. All patient's questions were answered with full understanding. Strict return precautions discussed. Patient expresses understanding and agreement to plan.   Orthostatic VS for the past 24 hrs:  BP- Lying Pulse- Lying BP- Sitting Pulse- Sitting BP- Standing at 0 minutes Pulse- Standing at 0 minutes  11/14/19 0453 107/68 93 118/83 99 122/76 84    Portions of this note were generated with [employer]. Dictation errors may occur despite best  attempts at proofreading.  Final Clinical Impression(s) / ED Diagnoses Final diagnoses:  Nonspecific chest pain  Rash    Rx / DC Orders ED Discharge Orders         Ordered    cephALEXin (KEFLEX) 500 MG capsule  4 times daily     11/14/19 0603           Volanda Napoleon, PA-C 11/14/19 1749    Ripley Fraise, MD 11/14/19 [phone]

## 2019-11-14 NOTE — ED Notes (Signed)
Attempt once to collect labs. While needle in [facility] PT went down in chair state she is about to pass out. Needle have been removed no labs have been collected at time

## 2019-11-14 NOTE — Discharge Instructions (Addendum)
Follow-up with referred neurologist.  You should not drive until you see the neurology.  Take antibiotics as directed. Please take all of your antibiotics until finished.  Make sure you are staying hydrated drink plenty of fluids.  Return the emergency department for any worsening chest pain, difficulty breathing, numbness/weakness in arms or legs, difficulty walking, vomiting or any other worsening or concerning symptoms.

## 2019-11-19 DIAGNOSIS — F4310 Post-traumatic stress disorder, unspecified: Secondary | ICD-10-CM | POA: Diagnosis not present

## 2019-11-28 DIAGNOSIS — L700 Acne vulgaris: Secondary | ICD-10-CM | POA: Diagnosis not present

## 2019-11-28 DIAGNOSIS — L738 Other specified follicular disorders: Secondary | ICD-10-CM | POA: Diagnosis not present

## 2019-11-28 DIAGNOSIS — D225 Melanocytic nevi of trunk: Secondary | ICD-10-CM | POA: Diagnosis not present

## 2019-11-28 DIAGNOSIS — D485 Neoplasm of uncertain behavior of skin: Secondary | ICD-10-CM | POA: Diagnosis not present

## 2019-11-28 DIAGNOSIS — L218 Other seborrheic dermatitis: Secondary | ICD-10-CM | POA: Diagnosis not present

## 2019-11-28 DIAGNOSIS — D2261 Melanocytic nevi of right upper limb, including shoulder: Secondary | ICD-10-CM | POA: Diagnosis not present

## 2019-11-29 ENCOUNTER — Telehealth (INDEPENDENT_AMBULATORY_CARE_PROVIDER_SITE_OTHER): Payer: BC Managed Care – PPO | Admitting: Neurology

## 2019-11-29 ENCOUNTER — Other Ambulatory Visit: Payer: Self-pay

## 2019-11-29 DIAGNOSIS — R439 Unspecified disturbances of smell and taste: Secondary | ICD-10-CM | POA: Diagnosis not present

## 2019-11-29 DIAGNOSIS — R42 Dizziness and giddiness: Secondary | ICD-10-CM | POA: Diagnosis not present

## 2019-11-29 DIAGNOSIS — R112 Nausea with vomiting, unspecified: Secondary | ICD-10-CM

## 2019-11-29 DIAGNOSIS — R55 Syncope and collapse: Secondary | ICD-10-CM | POA: Diagnosis not present
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
## 2019-12-05 DIAGNOSIS — M9901 Segmental and somatic dysfunction of cervical region: Secondary | ICD-10-CM | POA: Diagnosis not present

## 2019-12-05 DIAGNOSIS — M9902 Segmental and somatic dysfunction of thoracic region: Secondary | ICD-10-CM | POA: Diagnosis not present

## 2019-12-05 DIAGNOSIS — M9903 Segmental and somatic dysfunction of lumbar region: Secondary | ICD-10-CM | POA: Diagnosis not present

## 2019-12-05 DIAGNOSIS — M9905 Segmental and somatic dysfunction of pelvic region: Secondary | ICD-10-CM | POA: Diagnosis not present

## 2019-12-06 ENCOUNTER — Ambulatory Visit: Payer: BC Managed Care – PPO | Admitting: Cardiology

## 2019-12-07 ENCOUNTER — Other Ambulatory Visit: Payer: Self-pay

## 2019-12-07 ENCOUNTER — Encounter: Payer: Self-pay | Admitting: Cardiology

## 2019-12-07 ENCOUNTER — Ambulatory Visit (INDEPENDENT_AMBULATORY_CARE_PROVIDER_SITE_OTHER): Payer: BC Managed Care – PPO | Admitting: Cardiology

## 2019-12-07 VITALS — BP 117/56 | HR 69 | Ht 65.0 in | Wt 153.4 lb

## 2019-12-07 DIAGNOSIS — R002 Palpitations: Secondary | ICD-10-CM

## 2019-12-07 DIAGNOSIS — R55 Syncope and collapse: Secondary | ICD-10-CM | POA: Diagnosis not present

## 2019-12-07 NOTE — Patient Instructions (Signed)
Medication Instructions:  NO CHANGES  *If you need a refill on your cardiac medications before your next appointment, please call your pharmacy*   Lab Work: NOT NEEDED   Testing/Procedures: WILL BE SCHEDULE AT [place] [address] Your physician has requested that you have an echocardiogram. Echocardiography is a painless test that uses sound waves to create images of your heart. It provides your doctor with information about the size and shape of your heart and how well your heart's chambers and valves are working. This procedure takes approximately one hour. There are no restrictions for this procedure.  AND  Your physician has recommended that you wear a 14 DAY ZIO-PATCH monitor. The Zio patch cardiac monitor continuously records heart rhythm data for up to 14 days, this is for patients being evaluated for multiple types heart rhythms. For the first 24 hours post application, please avoid getting the Zio monitor wet in the shower or by excessive sweating during exercise. After that, feel free to carry on with regular activities. Keep soaps and lotions away from the ZIO XT Patch.  This will be mailed to you, please expect 7-10 days to receive.   [employer] location - [place], [address].         Follow-Up: At [facility], you and your health needs are our priority.  As part of our continuing mission to provide you with exceptional heart care, we have created designated Provider Care Teams.  These Care Teams include your primary Cardiologist (physician) and Advanced Practice Providers (APPs -  Physician Assistants and Nurse Practitioners) who all work together to provide you with the care you need, when you need it.  We recommend signing up for the patient portal called "MyChart".  Sign up information is provided on this After Visit Summary.  MyChart is used to connect with patients for Virtual Visits (Telemedicine).  Patients are able to view lab/test results,  encounter notes, upcoming appointments, etc.  Non-urgent messages can be sent to your provider as well.   To learn more about what you can do with MyChart, go to [email or web address].    Your next appointment:   3 month(s)  The format for your next appointment:   In Person  Provider:   Oswaldo Milian, MD   Other Instructions   Renee Mcdowell- Long Term Monitor Instructions   Your physician has requested you wear your ZIO patch monitor___14 ____days.   This is a single patch monitor.  [employer] supplies one patch monitor per enrollment.  Additional stickers are not available.   Please do not apply patch if you will be having a Nuclear Stress Test, Echocardiogram, Cardiac CT, MRI, or Chest Xray during the time frame you would be wearing the monitor. The patch cannot be worn during these tests.  You cannot remove and re-apply the ZIO XT patch monitor.   Your ZIO patch monitor will be sent USPS Priority mail from [facility] directly to your home address. The monitor may also be mailed to a PO BOX if home delivery is not available.   It may take 3-5 days to receive your monitor after you have been enrolled.   Once you have received you monitor, please review enclosed instructions.  Your monitor has already been registered assigning a specific monitor serial # to you.   Applying the monitor   Shave hair from upper left chest.   Hold abrader disc by orange tab.  Rub abrader in 40 strokes over left upper  chest as indicated in your monitor instructions.   Clean area with 4 enclosed alcohol pads .  Use all pads to assure are is cleaned thoroughly.  Let dry.   Apply patch as indicated in monitor instructions.  Patch will be place under collarbone on left side of chest with arrow pointing upward.   Rub patch adhesive wings for 2 minutes.Remove white label marked "1".  Remove white label marked "2".  Rub patch adhesive wings for 2 additional minutes.   While looking in a mirror,  press and release button in center of patch.  A small green light will flash 3-4 times .  This will be your only indicator the monitor has been turned on.     Do not shower for the first 24 hours.  You may shower after the first 24 hours.   Press button if you feel a symptom. You will hear a small click.  Record Date, Time and Symptom in the Patient Log Book.   When you are ready to remove patch, follow instructions on last 2 pages of Patient Log Book.  Stick patch monitor onto last page of Patient Log Book.   Place Patient Log Book in [place] box.  Use locking tab on box and tape box closed securely.  The [employer] and [employer] has [employer] on it.  Please place in mailbox as soon as possible.  Your physician should have your test results approximately 7 days after the monitor has been mailed back to [facility].   Call [facility] Customer Care at [phone] if you have questions regarding your ZIO XT patch monitor.  Call them immediately if you see an orange light blinking on your monitor.   If your monitor falls off in less than 4 days contact our Monitor department at [phone].  If your monitor becomes loose or falls off after 4 days call [employer] at [phone] for suggestions on securing your monitor.
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
## 2019-12-10 ENCOUNTER — Telehealth: Payer: Self-pay | Admitting: Radiology

## 2019-12-10 ENCOUNTER — Other Ambulatory Visit: Payer: BC Managed Care – PPO

## 2019-12-10 DIAGNOSIS — F4310 Post-traumatic stress disorder, unspecified: Secondary | ICD-10-CM | POA: Diagnosis not present

## 2019-12-10 NOTE — Telephone Encounter (Signed)
Enrolled patient for a 14 day Zio monitor to be mailed to patients home.  

## 2019-12-11 ENCOUNTER — Encounter: Payer: Self-pay | Admitting: Neurology

## 2019-12-12 ENCOUNTER — Ambulatory Visit (INDEPENDENT_AMBULATORY_CARE_PROVIDER_SITE_OTHER): Payer: BC Managed Care – PPO | Admitting: Neurology

## 2019-12-12 ENCOUNTER — Other Ambulatory Visit: Payer: Self-pay

## 2019-12-12 DIAGNOSIS — R55 Syncope and collapse: Secondary | ICD-10-CM

## 2019-12-12 DIAGNOSIS — M9903 Segmental and somatic dysfunction of lumbar region: Secondary | ICD-10-CM | POA: Diagnosis not present

## 2019-12-12 DIAGNOSIS — M9905 Segmental and somatic dysfunction of pelvic region: Secondary | ICD-10-CM | POA: Diagnosis not present

## 2019-12-12 DIAGNOSIS — M9902 Segmental and somatic dysfunction of thoracic region: Secondary | ICD-10-CM | POA: Diagnosis not present

## 2019-12-12 DIAGNOSIS — M9901 Segmental and somatic dysfunction of cervical region: Secondary | ICD-10-CM | POA: Diagnosis not present

## 2019-12-13 NOTE — Procedures (Signed)
ELECTROENCEPHALOGRAM REPORT  Date of Study: 12/12/2019  Patient's Name: Renee Mcdowell MRN: [id number] Date of Birth: 09-23-1995  Referring Provider: Dr. Patrcia Dolly  Clinical History: This is a 24 year old woman with an episode of loss of consciousness with report of shaking.   Medications: ADDERALL 20 MG tablet bacitracin ointment ATARAX/VISTARIL 25 MG tablet TRINTELLIX 20 MG TABS tablet CYANOCOBALAMIN 500 MCG table  Technical Summary: A multichannel digital EEG recording measured by the international 10-20 system with electrodes applied with paste and impedances below 5000 ohms performed in our laboratory with EKG monitoring in an awake and asleep patient.  Hyperventilation was not performed. Photic stimulation was performed.  The digital EEG was referentially recorded, reformatted, and digitally filtered in a variety of bipolar and referential montages for optimal display.    Description: The patient is awake and asleep during the recording.  During maximal wakefulness, there is a symmetric, medium voltage 10 Hz posterior dominant rhythm that attenuates with eye opening.  The record is symmetric.  During drowsiness and stage sleep, there is an increase in theta slowing of the background with vertex waves seen. Photic stimulation did not elicit any abnormalities.  There were no epileptiform discharges or electrographic seizures seen.    EKG lead was unremarkable.  Impression: This awake and asleep EEG is normal.    Clinical Correlation: A normal EEG does not exclude a clinical diagnosis of epilepsy.  If further clinical questions remain, prolonged EEG may be helpful.  Clinical correlation is advised.   Patrcia Dolly, M.D.

## 2019-12-14 ENCOUNTER — Telehealth: Payer: Self-pay

## 2019-12-14 NOTE — Telephone Encounter (Signed)
Attempted to reach patient.  Voicemail says she does not check it.  Will attempt again to reach and give results.

## 2019-12-14 NOTE — Telephone Encounter (Signed)
-----   Message from Van Clines, MD sent at 12/14/2019  9:07 AM EDT ----- Pls let her know brain wave test was normal, thanks

## 2019-12-18 DIAGNOSIS — F4310 Post-traumatic stress disorder, unspecified: Secondary | ICD-10-CM | POA: Diagnosis not present

## 2019-12-19 ENCOUNTER — Ambulatory Visit: Payer: BC Managed Care – PPO | Admitting: Cardiology

## 2019-12-19 DIAGNOSIS — M9903 Segmental and somatic dysfunction of lumbar region: Secondary | ICD-10-CM | POA: Diagnosis not present

## 2019-12-19 DIAGNOSIS — M9905 Segmental and somatic dysfunction of pelvic region: Secondary | ICD-10-CM | POA: Diagnosis not present

## 2019-12-19 DIAGNOSIS — M9901 Segmental and somatic dysfunction of cervical region: Secondary | ICD-10-CM | POA: Diagnosis not present

## 2019-12-19 DIAGNOSIS — M9902 Segmental and somatic dysfunction of thoracic region: Secondary | ICD-10-CM | POA: Diagnosis not present

## 2019-12-28 ENCOUNTER — Other Ambulatory Visit: Payer: BC Managed Care – PPO

## 2019-12-31 DIAGNOSIS — F4310 Post-traumatic stress disorder, unspecified: Secondary | ICD-10-CM | POA: Diagnosis not present

## 2020-01-14 DIAGNOSIS — F4310 Post-traumatic stress disorder, unspecified: Secondary | ICD-10-CM | POA: Diagnosis not present

## 2020-01-15 ENCOUNTER — Other Ambulatory Visit (HOSPITAL_COMMUNITY): Payer: BC Managed Care – PPO

## 2020-01-21 ENCOUNTER — Encounter (HOSPITAL_COMMUNITY): Payer: Self-pay | Admitting: Cardiology

## 2020-02-05 ENCOUNTER — Telehealth (HOSPITAL_COMMUNITY): Payer: Self-pay | Admitting: Cardiology

## 2020-02-05 NOTE — Telephone Encounter (Signed)
Just an FYI. We have made several attempts to contact this patient including sending a letter to schedule or reschedule their echocardiogram. We will be removing the patient from the echo WQ.  Mailed letter 01/21/2020  01/21/20 Called and no VM @ 11:14 and NA/LBW  01/18/2020 Called pt to reschedule No Show echo and she doesnt have VM/LBW @ 9:16  01/15/2020 Patient No Showed       Thank you

## 2020-02-15 ENCOUNTER — Telehealth: Payer: Self-pay | Admitting: Cardiology

## 2020-02-15 NOTE — Telephone Encounter (Signed)
I attempted to call patient on 02/15/20  to schedule follow up visit from patients recall list. The patient didn't answer but on voicemail stated not to leave any messages cause they will not be checked and the best way to contact would be via email.

## 2020-03-17 ENCOUNTER — Encounter: Payer: Self-pay | Admitting: General Practice

## 2020-03-28 DIAGNOSIS — F900 Attention-deficit hyperactivity disorder, predominantly inattentive type: Secondary | ICD-10-CM | POA: Diagnosis not present

## 2020-03-28 DIAGNOSIS — F3342 Major depressive disorder, recurrent, in full remission: Secondary | ICD-10-CM | POA: Diagnosis not present

## 2020-06-10 DIAGNOSIS — F4310 Post-traumatic stress disorder, unspecified: Secondary | ICD-10-CM | POA: Diagnosis not present

## 2020-07-21 DIAGNOSIS — Z79899 Other long term (current) drug therapy: Secondary | ICD-10-CM | POA: Diagnosis not present

## 2020-07-21 DIAGNOSIS — L639 Alopecia areata, unspecified: Secondary | ICD-10-CM | POA: Diagnosis not present

## 2020-09-29 DIAGNOSIS — L639 Alopecia areata, unspecified: Secondary | ICD-10-CM | POA: Diagnosis not present

## 2020-09-30 DIAGNOSIS — Z20822 Contact with and (suspected) exposure to covid-19: Secondary | ICD-10-CM | POA: Diagnosis not present

## 2020-10-14 DIAGNOSIS — F900 Attention-deficit hyperactivity disorder, predominantly inattentive type: Secondary | ICD-10-CM | POA: Diagnosis not present

## 2020-10-14 DIAGNOSIS — F410 Panic disorder [episodic paroxysmal anxiety] without agoraphobia: Secondary | ICD-10-CM | POA: Diagnosis not present

## 2020-10-14 DIAGNOSIS — F4322 Adjustment disorder with anxiety: Secondary | ICD-10-CM | POA: Diagnosis not present

## 2020-11-09 IMAGING — CR DG CHEST 2V
2 series · 2 of 2 positions shown · non-contrast
Comparison: Chest radiographs 04/14/2017.

CLINICAL DATA: 23-year-old female with chest pain, shortness of
breath, rash.

EXAM:
CHEST - 2 VIEW

[w chest lat]
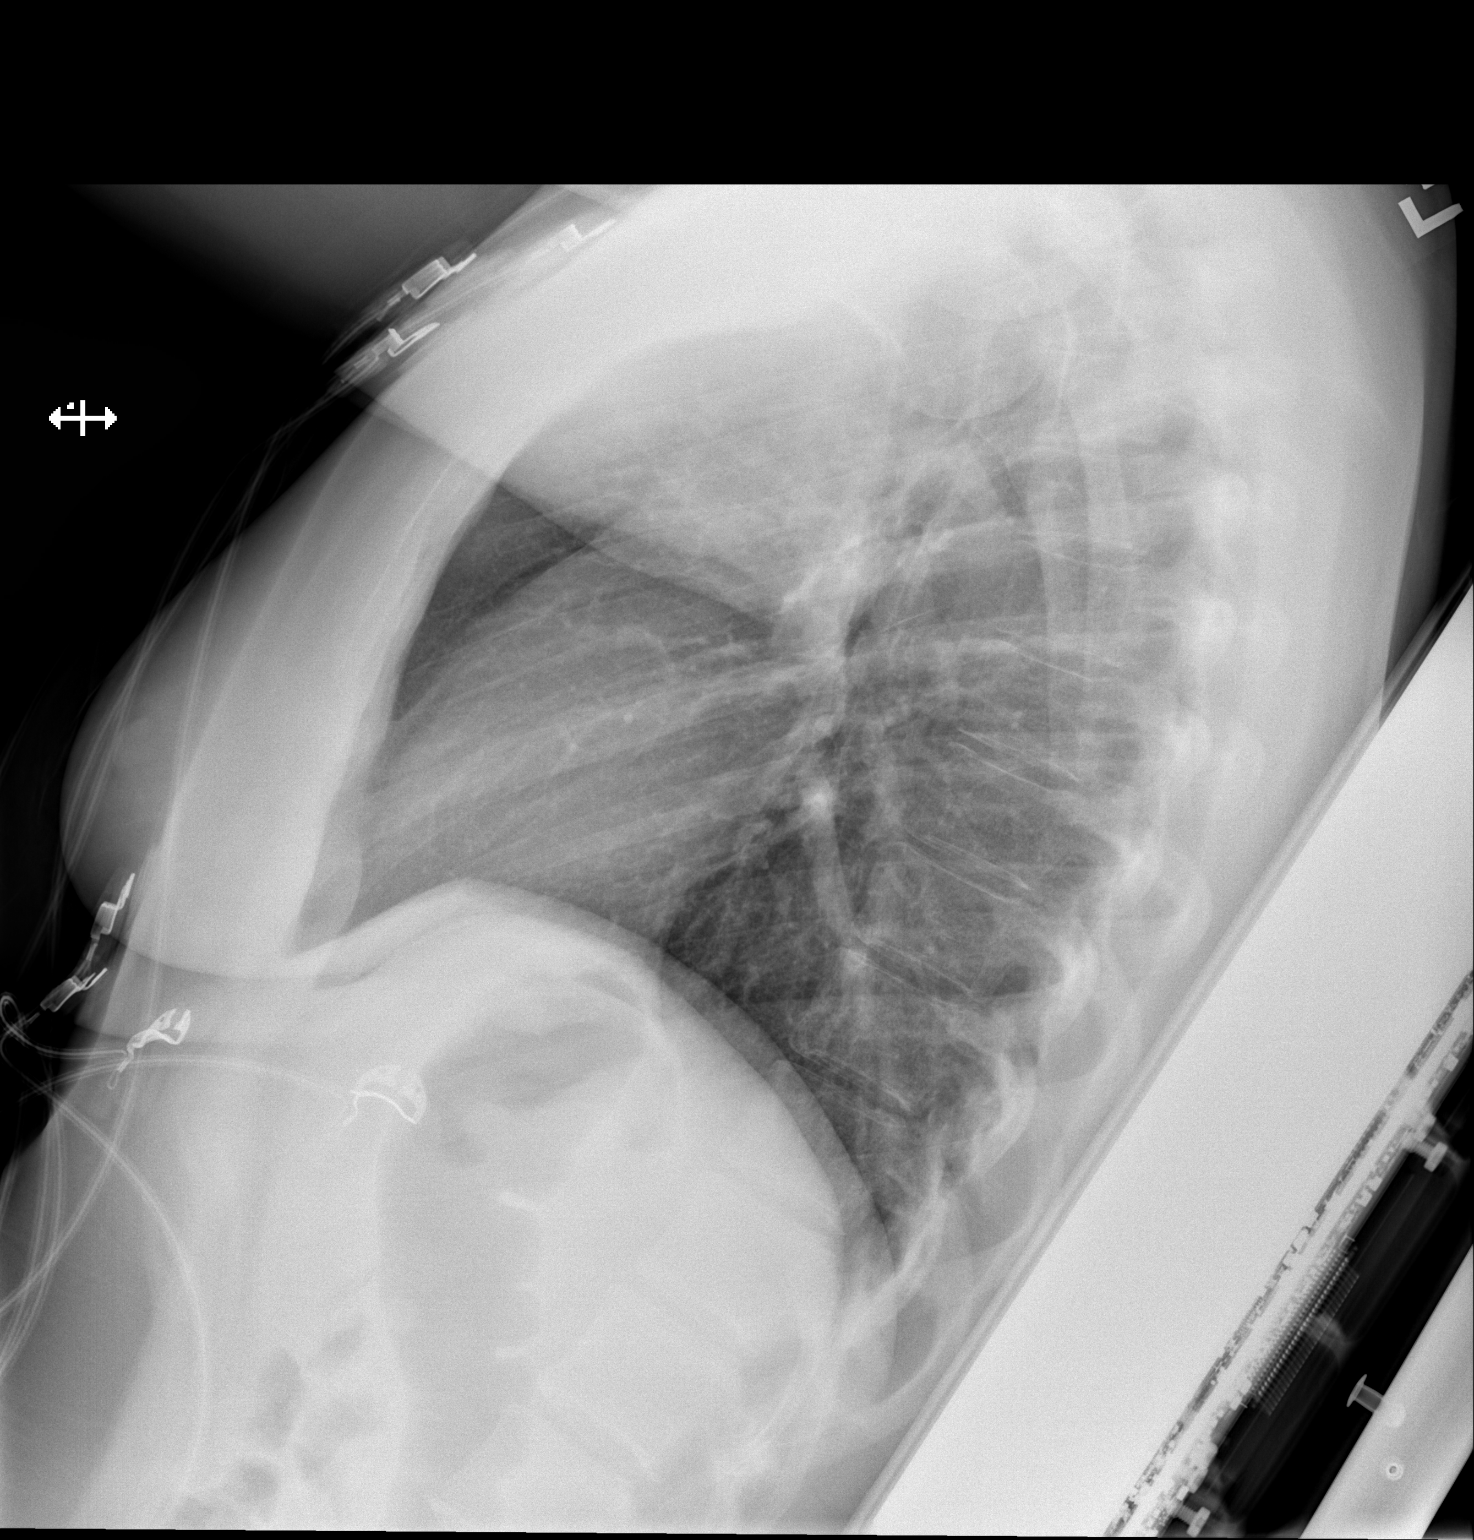

[x chest ap]
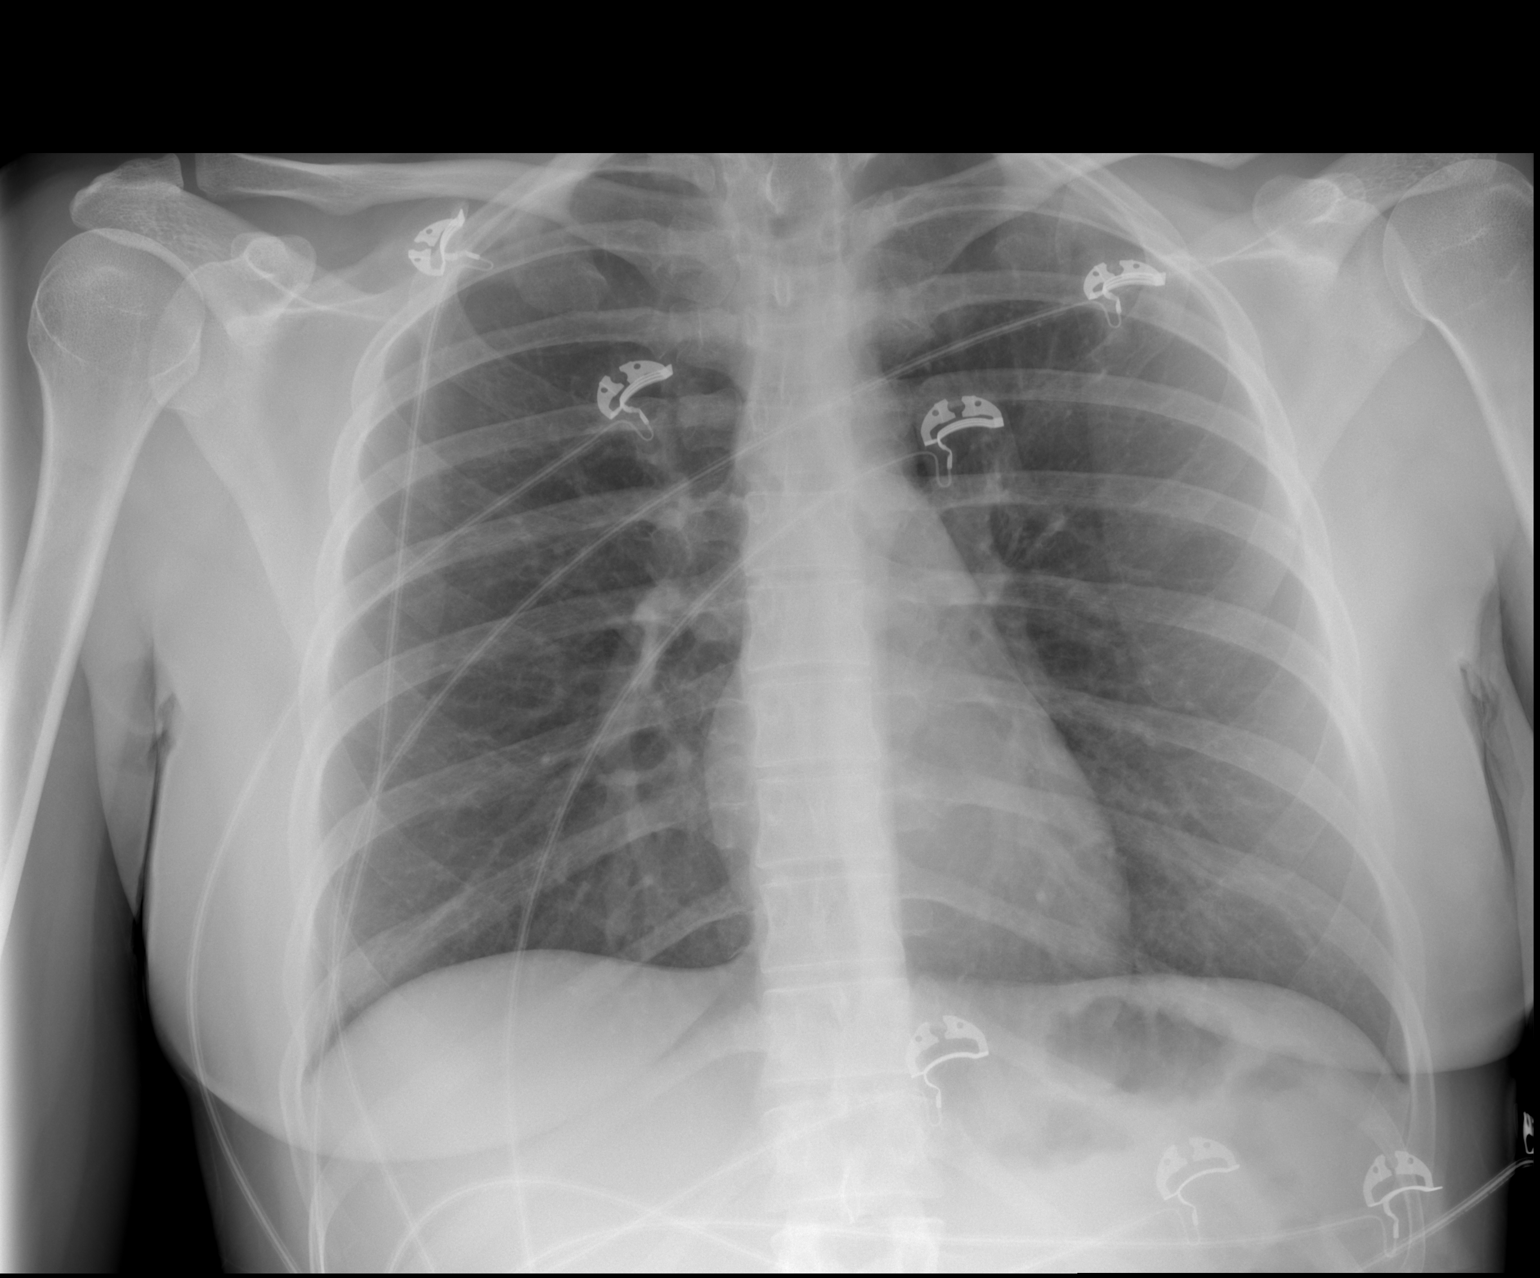

[2 of 2 positions shown; findings below may reference images not displayed]

FINDINGS: Semi upright AP and lateral views. Normal lung volumes and
mediastinal contours. Visualized tracheal air column is within
normal limits. Both lungs appear clear. No pneumothorax or pleural
effusion.

No osseous abnormality identified. Visible bowel-gas pattern within
normal limits.
IMPRESSION: Negative.  No cardiopulmonary abnormality.

## 2021-01-17 DIAGNOSIS — F331 Major depressive disorder, recurrent, moderate: Secondary | ICD-10-CM | POA: Diagnosis not present

## 2021-06-01 DIAGNOSIS — F410 Panic disorder [episodic paroxysmal anxiety] without agoraphobia: Secondary | ICD-10-CM | POA: Diagnosis not present

## 2021-06-01 DIAGNOSIS — F900 Attention-deficit hyperactivity disorder, predominantly inattentive type: Secondary | ICD-10-CM | POA: Diagnosis not present

## 2021-07-14 DIAGNOSIS — R630 Anorexia: Secondary | ICD-10-CM | POA: Insufficient documentation

## 2021-07-14 DIAGNOSIS — R1031 Right lower quadrant pain: Secondary | ICD-10-CM | POA: Diagnosis not present

## 2021-07-14 DIAGNOSIS — R112 Nausea with vomiting, unspecified: Secondary | ICD-10-CM | POA: Diagnosis not present

## 2021-07-14 DIAGNOSIS — Z87891 Personal history of nicotine dependence: Secondary | ICD-10-CM | POA: Insufficient documentation

## 2021-07-15 ENCOUNTER — Other Ambulatory Visit: Payer: Self-pay

## 2021-07-15 ENCOUNTER — Emergency Department (HOSPITAL_COMMUNITY): Payer: BC Managed Care – PPO

## 2021-07-15 ENCOUNTER — Emergency Department (HOSPITAL_COMMUNITY)
Admission: EM | Admit: 2021-07-15 | Discharge: 2021-07-15 | Disposition: A | Discharge status: Home / Self Care | Payer: BC Managed Care – PPO | Attending: Emergency Medicine | Admitting: Emergency Medicine

## 2021-07-15 ENCOUNTER — Encounter (HOSPITAL_COMMUNITY): Payer: Self-pay

## 2021-07-15 DIAGNOSIS — R1031 Right lower quadrant pain: Secondary | ICD-10-CM

## 2021-07-15 LAB — COMPREHENSIVE METABOLIC PANEL
ALT: 24 U/L (ref 0–44)
AST: 23 U/L (ref 15–41)
Albumin: 4.5 g/dL (ref 3.5–5.0)
Alkaline Phosphatase: 41 U/L (ref 38–126)
Anion gap: 5 (ref 5–15)
BUN: 12 mg/dL (ref 6–20)
CO2: 27 mmol/L (ref 22–32)
Calcium: 9.3 mg/dL (ref 8.9–10.3)
Chloride: 107 mmol/L (ref 98–111)
Creatinine, Ser: 0.73 mg/dL (ref 0.44–1.00)
GFR, Estimated: >60 mL/min (ref >60)
Glucose, Bld: 109 mg/dL — ABNORMAL HIGH (ref 70–99)
Potassium: 4.3 mmol/L (ref 3.5–5.1)
Sodium: 139 mmol/L (ref 135–145)
Total Bilirubin: 0.4 mg/dL (ref 0.3–1.2)
Total Protein: 7.6 g/dL (ref 6.5–8.1)

## 2021-07-15 LAB — URINALYSIS, ROUTINE W REFLEX MICROSCOPIC
Bilirubin Urine: NEGATIVE
Glucose, UA: NEGATIVE mg/dL
Ketones, ur: NEGATIVE mg/dL
Leukocytes,Ua: NEGATIVE
Nitrite: NEGATIVE
Protein, ur: NEGATIVE mg/dL
RBC / HPF: >50 RBC/hpf — ABNORMAL HIGH (ref 0–5)
Specific Gravity, Urine: <1.005 — ABNORMAL LOW (ref 1.005–1.030)
pH: 6 (ref 5.0–8.0)

## 2021-07-15 LAB — CBC WITH DIFFERENTIAL/PLATELET
Abs Immature Granulocytes: 0.02 10*3/uL (ref 0.00–0.07)
Basophils Absolute: 0 10*3/uL (ref 0.0–0.1)
Basophils Relative: 0 %
Eosinophils Absolute: 0.1 10*3/uL (ref 0.0–0.5)
Eosinophils Relative: 1 %
HCT: 43.7 % (ref 36.0–46.0)
Hemoglobin: 14.9 g/dL (ref 12.0–15.0)
Immature Granulocytes: 0 %
Lymphocytes Relative: 24 %
Lymphs Abs: 2.1 10*3/uL (ref 0.7–4.0)
MCH: 30.3 pg (ref 26.0–34.0)
MCHC: 34.1 g/dL (ref 30.0–36.0)
MCV: 88.8 fL (ref 80.0–100.0)
Monocytes Absolute: 0.5 10*3/uL (ref 0.1–1.0)
Monocytes Relative: 5 %
Neutro Abs: 6.3 10*3/uL (ref 1.7–7.7)
Neutrophils Relative %: 70 %
Platelets: 381 10*3/uL (ref 150–400)
RBC: 4.92 MIL/uL (ref 3.87–5.11)
RDW: 12.9 % (ref 11.5–15.5)
WBC: 9 10*3/uL (ref 4.0–10.5)
nRBC: 0 % (ref 0.0–0.2)

## 2021-07-15 LAB — I-STAT BETA HCG BLOOD, ED (MC, WL, AP ONLY): I-stat hCG, quantitative: <5 m[IU]/mL (ref <5)

## 2021-07-15 LAB — WET PREP, GENITAL
Clue Cells Wet Prep HPF POC: NONE SEEN
Sperm: NONE SEEN
Trich, Wet Prep: NONE SEEN
Yeast Wet Prep HPF POC: NONE SEEN

## 2021-07-15 LAB — LIPASE, BLOOD: Lipase: 57 U/L — ABNORMAL HIGH (ref 11–51)

## 2021-07-15 MED ORDER — SODIUM CHLORIDE 0.9 % IV BOLUS
1000.0000 mL | Freq: Once | INTRAVENOUS | Status: AC
  Administered 2021-07-15: 1000 mL via INTRAVENOUS

## 2021-07-15 MED ORDER — IOHEXOL 350 MG/ML SOLN
80.0000 mL | Freq: Once | INTRAVENOUS | Status: AC | PRN
  Administered 2021-07-15: 80 mL via INTRAVENOUS

## 2021-07-15 MED ORDER — MORPHINE SULFATE (PF) 4 MG/ML IV SOLN
4.0000 mg | Freq: Once | INTRAVENOUS | Status: AC
  Administered 2021-07-15: 4 mg via INTRAVENOUS
  Filled 2021-07-15: qty 1

## 2021-07-15 MED ORDER — ONDANSETRON HCL 4 MG/2ML IJ SOLN
4.0000 mg | Freq: Once | INTRAMUSCULAR | Status: AC
  Administered 2021-07-15: 4 mg via INTRAVENOUS
  Filled 2021-07-15: qty 2

## 2021-07-15 NOTE — ED Notes (Signed)
Pt attempted to urinate without results. Aware of need for sample, will attempt again shortly.

## 2021-07-15 NOTE — ED Triage Notes (Signed)
Pt complains of right lower abdominal pain since yesterday morning.

## 2021-07-15 NOTE — Discharge Instructions (Signed)
It was our pleasure taking care of you here in the emergency department  Your work-up throughout your stay did not show any significant abnormality.  I would recommend Tylenol and ibuprofen as needed for pain as well as warm compress  Follow-up with primary care provider in 2 days for reevaluation  Return for any new or worsening symptoms

## 2021-07-15 NOTE — ED Notes (Signed)
Pt ambulatory to restroom

## 2021-07-15 NOTE — ED Provider Notes (Signed)
Care assumed from previous provider.  See note for full HPI.  In summation 25 year old here for evaluation of right lower quadrant abdominal pain.  Some nausea, vomiting and chills.  Just completed her menstrual cycle yesterday.  She has no concerns for any STDs.  Previous provider obtained labs, pelvic exam.  There was no concern for PID, torsion.  Plan on follow-up on urine, CT scan  If no significant abnormality, plan on DC home with symptomatic management Physical Exam  BP 121/71   Pulse 86   Temp 98.3 F (36.8 C) (Oral)   Resp 18   SpO2 100%   Physical Exam Vitals and nursing note reviewed.  Constitutional:      General: She is not in acute distress.    Appearance: She is well-developed. She is not ill-appearing or diaphoretic.  HENT:     Head: Atraumatic.  Eyes:     Pupils: Pupils are equal, round, and reactive to light.  Cardiovascular:     Rate and Rhythm: Normal rate and regular rhythm.  Pulmonary:     Effort: Pulmonary effort is normal. No respiratory distress.  Abdominal:     General: There is no distension.     Palpations: Abdomen is soft.  Musculoskeletal:        General: Normal range of motion.     Cervical back: Normal range of motion and neck supple.  Skin:    General: Skin is warm and dry.  Neurological:     General: No focal deficit present.     Mental Status: She is alert and oriented to person, place, and time.    ED Course/Procedures   Clinical Course as of 07/15/21 1008  Wed Jul 15, 2021  0634 ABD pain RLQ> Steady. N/V. No concern for PID, STD, FU on CT and UA. No need for [place] [BH]    Clinical Course User Index [BH] Chayanne Speir A, PA-C    Procedures Labs Reviewed  WET PREP, GENITAL - Abnormal; Notable for the following components:      Result Value   WBC, Wet Prep HPF POC FEW (*)    All other components within normal limits  URINALYSIS, ROUTINE W REFLEX MICROSCOPIC - Abnormal; Notable for the following components:   Specific Gravity,  Urine <1.005 (*)    Hgb urine dipstick LARGE (*)    RBC / HPF >50 (*)    Bacteria, UA RARE (*)    All other components within normal limits  COMPREHENSIVE METABOLIC PANEL - Abnormal; Notable for the following components:   Glucose, Bld 109 (*)    All other components within normal limits  LIPASE, BLOOD - Abnormal; Notable for the following components:   Lipase 57 (*)    All other components within normal limits  CBC WITH DIFFERENTIAL/PLATELET  I-STAT BETA HCG BLOOD, ED ([facility], [facility], [facility] ONLY)  GC/CHLAMYDIA PROBE AMP ([facility]) NOT AT [facility]   CT Abdomen Pelvis W Contrast  Result Date: 07/15/2021 CLINICAL DATA:  25 year old female with history of right lower quadrant abdominal pain for the past 2 days. Elevated lipase level. EXAM: CT ABDOMEN AND PELVIS WITH CONTRAST TECHNIQUE: Multidetector CT imaging of the abdomen and pelvis was performed using the standard protocol following bolus administration of intravenous contrast. CONTRAST:  19mL OMNIPAQUE IOHEXOL 350 MG/ML SOLN COMPARISON:  No priors. FINDINGS: Lower chest: Unremarkable. Hepatobiliary: No suspicious cystic or solid hepatic lesions. No intra or extrahepatic biliary ductal dilatation. Gallbladder is normal in appearance. Pancreas: No pancreatic mass. No pancreatic ductal dilatation.  No pancreatic or peripancreatic fluid collections or inflammatory changes. Spleen: Unremarkable. Adrenals/Urinary Tract: Bilateral kidneys and bilateral adrenal glands are normal in appearance. No hydroureteronephrosis. Urinary bladder is normal in appearance. Stomach/Bowel: The appearance of the stomach is normal. There is no pathologic dilatation of small bowel or colon. Normal appendix. Vascular/Lymphatic: No significant atherosclerotic disease, aneurysm or dissection noted in the abdominal or pelvic vasculature. No lymphadenopathy noted in the abdomen or pelvis. Reproductive: Uterus and ovaries are unremarkable in appearance. Other: No significant volume of ascites.   No pneumoperitoneum. Musculoskeletal: There are no aggressive appearing lytic or blastic lesions noted in the visualized portions of the skeleton. IMPRESSION: 1. No acute findings are noted in the abdomen or pelvis to account for the patient's symptoms. Specifically, the appendix is normal, and there are no overt imaging findings of pancreatitis at this time. Electronically Signed   By: Trudie Reed M.D.   On: 07/15/2021 08:13    MDM  Plan on follow-up on CT scan, urine if notes abnormality, likely DC home with symptomatic management  CT scan shows no significant abnormality Urine shows no infection  Patient assessed.  She appears comfortable in the room.  Unclear etiology of patient's symptoms.  No indication of appendicitis, bowel obstruction, bowel perforation, cholecystitis, diverticulitis, PID, TOA, torsion/intermittent torsion or ectopic pregnancy.    Patient discharged home with symptomatic treatment and given strict instructions for follow-up with their primary care physician.  I have also discussed reasons to return immediately to the ER.  Patient expresses understanding and agrees with plan.         Nikie Cid A, PA-C 07/15/21 1008    Horton, Clabe Seal, DO 07/15/21 1438

## 2021-07-15 NOTE — ED Provider Notes (Signed)
Emergency Medicine Provider Triage Evaluation Note  Renee Mcdowell , a 25 y.o. female  was evaluated in triage.  Pt complains of abdominal pain that began this AM.  Patient reports that the pain is located in the right lower quadrant, it also seems to be in her right back, it is been fairly constant throughout the day with associated nausea, vomiting, sweats & chills.  No alleviating or aggravating factors.  She just finished her menstrual cycle yesterday.  She reports being in a monogamous relationship without concern for STI.  Denies hematemesis, diarrhea, melena, hematochezia, dysuria, or vaginal discharge  Review of Systems  Per HPI  Physical Exam  BP (!) 148/91 (BP Location: Left Arm)   Pulse 79   Temp 98.3 F (36.8 C) (Oral)   Resp 13   SpO2 100%  Gen:   Awake, no distress   Resp:  Normal effort  MSK:   Moves extremities without difficulty  Other:  Abdominal exam with right suprapubic and lower abdominal tenderness to palpation.  No peritoneal signs.  Medical Decision Making  Medically screening exam initiated at 12:43 AM.  Appropriate orders placed.  Renee Mcdowell was informed that the remainder of the evaluation will be completed by another provider, this initial triage assessment does not replace that evaluation, and the importance of remaining in the ED until their evaluation is complete.  Abdominal pain.   Desmond Lope 07/15/21 0045    Glynn Octave, MD 07/15/21 [phone]

## 2021-07-15 NOTE — ED Notes (Signed)
Water given to pt 

## 2021-07-15 NOTE — ED Provider Notes (Signed)
[facility]-EMERGENCY DEPT Provider Note   CSN: [id number] Arrival date & time: 07/14/21  2256     History Chief Complaint  Patient presents with   Abdominal Pain    Renee Mcdowell is a 25 y.o. female with history of ADHD, PTSD, anxiety, and depression who presents to the emergency department with complaints of abdominal pain that began this morning.  Patient states the pain is located to the right lower quadrant of the abdomen, somewhat goes to the back, has been fairly steady throughout the day but has waxed and waned.  No specific alleviating or rating factors.  She has had associated nausea, vomiting, decreased appetite, sweats, and chills.  She just missed her menstrual cycle yesterday.  She reports being monogamous relationship without concern for STI.  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 No pertinent surgical history.   OB History   No obstetric history on file.     Family History  Problem Relation Age of Onset   Mental illness Father        Schizoaffective disorder   Thyroid disease Mother    Valvular heart disease Mother     Social History   Tobacco Use   Smoking status: Former    Packs/day: 0.10    Types: Cigarettes   Smokeless tobacco: Never  Vaping Use   Vaping Use: Every day  Substance Use Topics   Alcohol use: Yes     Alcohol/week: 2.0 standard drinks    Types: 2 Glasses of wine per week   Drug use: No    Home Medications Prior to Admission medications   Medication Sig Start Date End Date Taking? Authorizing Provider  amphetamine-dextroamphetamine (ADDERALL XR) 20 MG 24 hr capsule Take 20 mg by mouth 3 (three) times daily.   Yes [provider]  TRINTELLIX 20 MG TABS tablet Take 20 mg by mouth daily. 10/24/19  Yes [provider]  bacitracin ointment Apply topically 2 (two) times daily. Patient not taking: Reported on 07/15/2021 12/31/16   Oneta Rack, NP  hydrOXYzine (ATARAX/VISTARIL) 25 MG tablet Take 1 tablet (25 mg total) by mouth every 6 (six) hours as needed for anxiety. Patient not taking: Reported on 07/15/2021 12/31/16   Oneta Rack, NP    Allergies    Azithromycin  Review of Systems   Review of Systems  Constitutional:  Positive for appetite change and chills. Negative for fever.  Respiratory:  Negative for cough and shortness of breath.   Cardiovascular:  Negative for chest pain.  Gastrointestinal:  Positive for abdominal pain, nausea and vomiting. Negative for blood in stool and diarrhea.  Genitourinary:  Negative for dysuria and vaginal discharge.  Neurological:  Negative for syncope.  All other systems reviewed and are negative.  Physical Exam Updated Vital Signs BP (!) 137/97 (BP Location: Left Arm)   Pulse 73   Temp 98.3 F (36.8 C) (  Oral)   Resp 16   SpO2 100%   Physical Exam Vitals and nursing note reviewed.  Constitutional:      General: She is not in acute distress.    Appearance: She is well-developed. She is not toxic-appearing.  HENT:     Head: Normocephalic and atraumatic.  Eyes:     General:        Right eye: No discharge.        Left eye: No discharge.     Conjunctiva/sclera: Conjunctivae normal.  Cardiovascular:     Rate and Rhythm: Normal rate and regular rhythm.  Pulmonary:     Effort: Pulmonary effort is normal. No  respiratory distress.     Breath sounds: Normal breath sounds. No wheezing, rhonchi or rales.  Abdominal:     General: There is no distension.     Palpations: Abdomen is soft.     Tenderness: There is abdominal tenderness in the right lower quadrant. There is no guarding or rebound.  Genitourinary:    Comments: Raelene Bott, NT present as chaperone.  No external lesions.  No significant discharge or bleeding.  No cervical motion tenderness.  Mild right adnexal tenderness. Musculoskeletal:     Cervical back: Neck supple.  Skin:    General: Skin is warm and dry.     Findings: No rash.  Neurological:     Mental Status: She is alert.     Comments: Clear speech.   Psychiatric:        Behavior: Behavior normal.    ED Results / Procedures / Treatments   Labs (all labs ordered are listed, but only abnormal results are displayed) Labs Reviewed  COMPREHENSIVE METABOLIC PANEL - Abnormal; Notable for the following components:      Result Value   Glucose, Bld 109 (*)    All other components within normal limits  LIPASE, BLOOD - Abnormal; Notable for the following components:   Lipase 57 (*)    All other components within normal limits  WET PREP, GENITAL  CBC WITH DIFFERENTIAL/PLATELET  URINALYSIS, ROUTINE W REFLEX MICROSCOPIC  I-STAT BETA HCG BLOOD, ED ([facility], [facility], [facility] ONLY)  GC/CHLAMYDIA PROBE AMP ([place]) NOT AT [facility]    EKG None  Radiology No results found.  Procedures Procedures   Medications Ordered in ED Medications  sodium chloride 0.9 % bolus 1,000 mL (has no administration in time range)  ondansetron (ZOFRAN) injection 4 mg (has no administration in time range)  morphine 4 MG/ML injection 4 mg (has no administration in time range)    ED Course  I have reviewed the triage vital signs and the nursing notes.  Pertinent labs & imaging results that were available during my care of the patient were reviewed by me and considered in my medical decision making (see chart  for details).   MDM Rules/Calculators/A&P                          Patient presents to the ED with complaints of abdominal pain.  She is nontoxic, resting comfortably, vitals with elevated blood pressure, doubt hypertensive emergency.  On exam patient has right lower quadrant abdominal tenderness as well as right adnexal tenderness.  No peritoneal signs.  No cervical motion tenderness-clinically not consistent with PID.  DDx: Ovarian cyst, ovarian torsion, appendicitis, nephrolithiasis, pyelonephritis, viral GI illness, musculoskeletal pain, ectopic pregnancy  Additional history obtained:  Additional history obtained from chart review & nursing note review.   Lab Tests:  I Ordered, reviewed, and interpreted labs, which included:  CBC: Unremarkable CMP: Fairly unremarkable Lipase: Minimally elevated Pregnancy test: Negative Urinalysis: Pending  Patient with right lower quadrant tenderness - we discussed initial assessment with ultrasound given her right adnexal tenderness as well and given this does not involve radiation, patient is concerned about her appendix or a kidney stone which each remain on the differential, we discussed for/benefit of initial imaging of ultrasound versus CT, shared decision making- patient would like to proceed with CT imaging which I feel is reasonable.  Morphine ordered for pain, Zofran ordered for nausea, and fluids ordered for hydration.  Patient care signed out to PA Henderly at change of shift pending remaining work-up and disposition.  Portions of this note were generated with Scientist, clinical (histocompatibility and immunogenetics). Dictation errors may occur despite best attempts at proofreading.  Final Clinical Impression(s) / ED Diagnoses Final diagnoses:  None    Rx / DC Orders ED Discharge Orders     None        Cherly Anderson, PA-C 07/15/21 7616    Glynn Octave, MD 07/15/21 [phone]

## 2021-07-16 LAB — GC/CHLAMYDIA PROBE AMP (~~LOC~~) NOT AT ARMC
Chlamydia: NEGATIVE
Comment: NEGATIVE
Comment: NORMAL
Neisseria Gonorrhea: NEGATIVE

## 2021-07-21 DIAGNOSIS — R1031 Right lower quadrant pain: Secondary | ICD-10-CM | POA: Diagnosis not present

## 2021-07-21 DIAGNOSIS — Z309 Encounter for contraceptive management, unspecified: Secondary | ICD-10-CM | POA: Diagnosis not present

## 2021-08-18 DIAGNOSIS — F900 Attention-deficit hyperactivity disorder, predominantly inattentive type: Secondary | ICD-10-CM | POA: Diagnosis not present

## 2021-08-18 DIAGNOSIS — F3342 Major depressive disorder, recurrent, in full remission: Secondary | ICD-10-CM | POA: Diagnosis not present

## 2022-02-12 DIAGNOSIS — F900 Attention-deficit hyperactivity disorder, predominantly inattentive type: Secondary | ICD-10-CM | POA: Diagnosis not present

## 2022-02-12 DIAGNOSIS — F3342 Major depressive disorder, recurrent, in full remission: Secondary | ICD-10-CM | POA: Diagnosis not present

## 2022-02-18 DIAGNOSIS — N926 Irregular menstruation, unspecified: Secondary | ICD-10-CM | POA: Diagnosis not present

## 2022-02-18 DIAGNOSIS — Z309 Encounter for contraceptive management, unspecified: Secondary | ICD-10-CM | POA: Diagnosis not present

## 2022-04-19 DIAGNOSIS — Z0000 Encounter for general adult medical examination without abnormal findings: Secondary | ICD-10-CM | POA: Diagnosis not present

## 2022-04-19 DIAGNOSIS — E538 Deficiency of other specified B group vitamins: Secondary | ICD-10-CM | POA: Diagnosis not present

## 2022-04-19 DIAGNOSIS — Z13220 Encounter for screening for lipoid disorders: Secondary | ICD-10-CM | POA: Diagnosis not present

## 2022-04-19 DIAGNOSIS — F909 Attention-deficit hyperactivity disorder, unspecified type: Secondary | ICD-10-CM | POA: Diagnosis not present

## 2022-04-19 DIAGNOSIS — R55 Syncope and collapse: Secondary | ICD-10-CM | POA: Diagnosis not present

## 2022-12-27 DIAGNOSIS — F900 Attention-deficit hyperactivity disorder, predominantly inattentive type: Secondary | ICD-10-CM | POA: Diagnosis not present

## 2022-12-27 DIAGNOSIS — F3342 Major depressive disorder, recurrent, in full remission: Secondary | ICD-10-CM | POA: Diagnosis not present

## 2023-01-19 DIAGNOSIS — F3342 Major depressive disorder, recurrent, in full remission: Secondary | ICD-10-CM | POA: Diagnosis not present

## 2023-01-19 DIAGNOSIS — F900 Attention-deficit hyperactivity disorder, predominantly inattentive type: Secondary | ICD-10-CM | POA: Diagnosis not present

## 2023-07-12 DIAGNOSIS — L700 Acne vulgaris: Secondary | ICD-10-CM | POA: Diagnosis not present

## 2023-08-01 ENCOUNTER — Other Ambulatory Visit: Payer: Self-pay

## 2023-08-01 ENCOUNTER — Ambulatory Visit (HOSPITAL_COMMUNITY)
Admission: EM | Admit: 2023-08-01 | Discharge: 2023-08-01 | Disposition: A | Discharge status: Home / Self Care | Payer: BC Managed Care – PPO | Attending: Internal Medicine | Admitting: Internal Medicine

## 2023-08-01 ENCOUNTER — Encounter (HOSPITAL_COMMUNITY): Payer: Self-pay | Admitting: *Deleted

## 2023-08-01 DIAGNOSIS — S46811A Strain of other muscles, fascia and tendons at shoulder and upper arm level, right arm, initial encounter: Secondary | ICD-10-CM | POA: Diagnosis not present

## 2023-08-01 MED ORDER — DEXAMETHASONE SODIUM PHOSPHATE 10 MG/ML IJ SOLN
INTRAMUSCULAR | Status: AC
  Filled 2023-08-01: qty 1

## 2023-08-01 MED ORDER — KETOROLAC TROMETHAMINE 30 MG/ML IJ SOLN
INTRAMUSCULAR | Status: AC
  Filled 2023-08-01: qty 1

## 2023-08-01 MED ORDER — DEXAMETHASONE SODIUM PHOSPHATE 10 MG/ML IJ SOLN
10.0000 mg | Freq: Once | INTRAMUSCULAR | Status: AC
  Administered 2023-08-01: 10 mg via INTRAMUSCULAR

## 2023-08-01 MED ORDER — KETOROLAC TROMETHAMINE 30 MG/ML IJ SOLN
30.0000 mg | Freq: Once | INTRAMUSCULAR | Status: AC
  Administered 2023-08-01: 30 mg via INTRAMUSCULAR

## 2023-08-01 MED ORDER — CYCLOBENZAPRINE HCL 10 MG PO TABS: 10.0000 mg | ORAL_TABLET | Freq: Two times a day (BID) | ORAL | 0 refills | Status: DC | PRN

## 2023-08-01 MED ORDER — PREDNISONE 10 MG (21) PO TBPK: ORAL_TABLET | Freq: Every day | ORAL | 0 refills | Status: DC

## 2023-08-01 NOTE — Discharge Instructions (Addendum)
Likely trapezius muscle strained. Will treat with the following:  Toradol injection for pain Decadron injection for inflammation Prednisone taper. Take 6 tabs by mouth daily for 2 days, then 5 tabs for 2 days, then 4 tabs for 2 days, then 3 tabs for 2 days, 2 tabs for 2 days, then 1 tab by mouth daily for 2 days Flexeril 10mg  every 12 hours as needed for muscle spasm. Use caution as this medicine can make you drowsy.  Avoid heavy lifting for the next few days Return to urgent care or PCP if symptoms worsen or fail to resolve.

## 2023-08-01 NOTE — ED Provider Notes (Signed)
[facility]    CSN: [id number] Arrival date & time: 08/01/23  1959      History   Chief Complaint Chief Complaint  Patient presents with   Shoulder Pain    HPI Renee Mcdowell is a 27 y.o. female.   27 year old female who presents to urgent care with complaints of right posterior shoulder/right upper back pain.  This started about 2 weeks ago.  She has been doing a lot of moving lately and a lot of lifting.  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  She is right-handed.  She denies any numbness or tingling into the hand or arm but has noted it is difficult to use the arm due to the pain in the back area.  She denies any specific injury to the area.  She also notes that the area hurts when she takes a breath in but she does not have shortness of breath.    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 No pertinent surgical history.  OB History   No obstetric history on file.      Home Medications    Prior to Admission medications   Medication Sig Start Date End Date Taking? Authorizing Provider  amphetamine-dextroamphetamine (ADDERALL XR) 20 MG 24 hr capsule Take 20 mg by mouth 3 (three) times daily.   Yes [provider]  bacitracin ointment Apply topically 2 (two) times daily. Patient not taking: Reported on 07/15/2021 12/31/16   Oneta Rack, NP  hydrOXYzine (ATARAX/VISTARIL) 25 MG tablet Take 1 tablet (25 mg total) by mouth every 6 (six) hours as needed for anxiety. Patient not taking: Reported on 07/15/2021 12/31/16   Oneta Rack, NP  TRINTELLIX 20 MG TABS tablet Take 20 mg by mouth daily. 10/24/19   [provider]    Family History Family History  Problem Relation Age of Onset   Mental illness Father        Schizoaffective disorder   Thyroid disease Mother    Valvular heart disease Mother     Social History Social History   Tobacco Use   Smoking status: Former    Current packs/day: 0.10    Types: Cigarettes   Smokeless tobacco: Never  Vaping Use   Vaping status: Every Day  Substance Use Topics   Alcohol use: Yes    Alcohol/week: 2.0 standard drinks of alcohol    Types: 2 Glasses of wine per week   Drug use: No     Allergies   Azithromycin   Review of Systems Review of Systems  Constitutional:  Negative for chills and fever.  HENT:  Negative for ear pain and sore throat.   Eyes:  Negative for pain and visual disturbance.  Respiratory:  Negative for cough and shortness of breath.   Cardiovascular:  Negative for chest pain and palpitations.  Gastrointestinal:  Negative for abdominal pain and vomiting.  Genitourinary:  Negative for dysuria and hematuria.  Musculoskeletal:  Negative for arthralgias and back pain.       Right shoulder pain  Skin:  Negative for color change and rash.  Neurological:  Negative for seizures and syncope.  All other systems reviewed and are negative.    Physical Exam Triage Vital Signs ED Triage Vitals  Encounter Vitals Group     BP 08/01/23 2012 122/78     Systolic BP Percentile --      Diastolic BP Percentile --      Pulse Rate 08/01/23 2012 79     Resp 08/01/23 2012 16     Temp --      Temp src --      SpO2 08/01/23 2012 97 %     Weight --      Height --      Head Circumference --      Peak Flow --      Pain Score 08/01/23 2009 8      Pain Loc --      Pain Education --      Exclude from Growth Chart --    No data found.  Updated Vital Signs BP 122/78   Pulse 79   Resp 16   LMP 07/31/2023   SpO2 97%   Visual Acuity Right Eye Distance:   Left Eye Distance:   Bilateral Distance:    Right Eye Near:   Left Eye Near:    Bilateral Near:     Physical Exam Vitals and nursing note reviewed.  Constitutional:      General: She is not in acute distress.    Appearance: She is well-developed.  HENT:     Head: Normocephalic and atraumatic.  Eyes:     Conjunctiva/sclera: Conjunctivae normal.  Cardiovascular:     Rate and Rhythm: Normal rate and regular rhythm.     Heart sounds: No murmur heard. Pulmonary:     Effort: Pulmonary effort is normal. No respiratory distress.     Breath sounds: Normal breath sounds.  Abdominal:     Palpations: Abdomen is soft.     Tenderness: There is no abdominal tenderness.  Musculoskeletal:        General: No swelling.     Right shoulder: No swelling, deformity, effusion or tenderness. Normal range of motion.     Cervical back: Normal and neck supple.     Thoracic back: Normal.       Back:  Skin:    General: Skin is warm and dry.     Capillary Refill: Capillary refill takes less than 2 seconds.  Neurological:     Mental Status: She is alert.  Psychiatric:        Mood and Affect: Mood normal.      UC Treatments / Results  Labs (all labs ordered are listed, but only abnormal results are displayed) Labs Reviewed - No data to display  EKG   Radiology No results found.  Procedures Procedures (including critical care time)  Medications Ordered in UC Medications - No data to display  Initial Impression / Assessment and Plan / UC Course  I have reviewed the triage vital signs and the nursing notes.  Pertinent labs & imaging results that were available during my care of the patient were reviewed by me and considered in my medical decision making (see chart for  details).     Trapezius muscle strain, right, initial encounter  Likely trapezius muscle strained give location and recent heavy lifting. Will treat with the following:  Toradol injection for pain Decadron injection for inflammation Prednisone taper. Take 6 tabs by mouth daily for 2 days, then 5 tabs for 2 days, then 4 tabs for 2 days, then 3 tabs for 2 days, 2 tabs for 2 days, then 1 tab by mouth daily for 2 days Flexeril 10mg  every 12 hours as needed for muscle spasm. Use caution as this medicine can make you drowsy.  Avoid heavy lifting for the next few days Return to urgent care or PCP if symptoms worsen or fail to resolve.    Final Clinical Impressions(s) / UC Diagnoses   Final diagnoses:  None   Discharge Instructions   None    ED Prescriptions   None    PDMP not reviewed this encounter.   Landis Martins, [place] 08/01/23 2036

## 2023-08-01 NOTE — ED Triage Notes (Signed)
Pt reports RT shoulder pain for a few weeks the patient reports pain worse now and can bearly use arm.

## 2023-08-05 ENCOUNTER — Encounter (HOSPITAL_COMMUNITY): Payer: Self-pay

## 2023-08-05 ENCOUNTER — Emergency Department (HOSPITAL_COMMUNITY)
Admission: EM | Admit: 2023-08-05 | Discharge: 2023-08-05 | Disposition: A | Discharge status: Home / Self Care | Payer: BC Managed Care – PPO | Attending: Emergency Medicine | Admitting: Emergency Medicine

## 2023-08-05 ENCOUNTER — Other Ambulatory Visit: Payer: Self-pay

## 2023-08-05 DIAGNOSIS — M25511 Pain in right shoulder: Secondary | ICD-10-CM | POA: Diagnosis not present

## 2023-08-05 MED ORDER — ACETAMINOPHEN 500 MG PO TABS
1000.0000 mg | ORAL_TABLET | Freq: Once | ORAL | Status: AC
  Administered 2023-08-05: 1000 mg via ORAL
  Filled 2023-08-05: qty 2

## 2023-08-05 MED ORDER — KETOROLAC TROMETHAMINE 15 MG/ML IJ SOLN
15.0000 mg | Freq: Once | INTRAMUSCULAR | Status: AC
  Administered 2023-08-05: 15 mg via INTRAMUSCULAR
  Filled 2023-08-05: qty 1

## 2023-08-05 NOTE — Discharge Instructions (Addendum)
You were seen here today for shoulder pain.  Please use naproxen, Tylenol as needed for pain and continue using Flexeril and prednisone as prescribed your PCP and orthopedic surgery.  Please follow-up with your PCP and orthopedics.  Please return to the emergency department for chest pain, shortness of breath, severe vomiting or inability to keep down food, loss of consciousness, or any worsening symptom or concern.

## 2023-08-05 NOTE — ED Triage Notes (Signed)
Pt c.o right shoulder pain for the past week but the pain is now radiating down to her lower back. Pt has sling in place. Pt was seen at [facility] on 11/25 and was told she pulled a muscle. Pt given muscle relaxer and prednisone.

## 2023-08-05 NOTE — ED Provider Notes (Signed)
[place] EMERGENCY DEPARTMENT AT [facility] Provider Note   CSN: [id number] Arrival date & time: 08/05/23  1131     History {Add pertinent medical, surgical, social history, OB history to HPI:1} Chief Complaint  Patient presents with   Shoulder Pain   Back Pain    Renee Mcdowell is a 27 y.o. female with a PMH of depression, ADHD who presented to the ED for right shoulder pain.  Patient reports she was moving furniture approximately 3 weeks ago, when she thinks she hurt her shoulder.  Denies a specific injury, reports she thinks she may have overused her arm.  Reports she went to urgent care, where they diagnosed her with a trapezius muscle strain and prescribed her prednisone and Flexeril and told her to stop using her arm.  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  She currently is complaining of right shoulder pain that has radiated all the way down into her back.  Denies neck pain, lower arm pain, hand pain, numbness, or weakness in her hand.  Reports it hurts worse with moving.  States she has been taking prednisone as prescribed but is not taking the Flexeril frequently because it makes her tired.  Denies taking any other medication for the pain.  Denies other concerns today including fever, chest pain, shortness of breath, abdominal pain, vomiting, diarrhea.   Shoulder Pain Associated symptoms: back pain   Back Pain      Home Medications Prior to Admission medications   Medication Sig Start Date End Date Taking? Authorizing Provider  amphetamine-dextroamphetamine (ADDERALL XR) 20 MG 24 hr capsule Take 20 mg by mouth 3 (three) times daily.    [provider]  bacitracin ointment Apply topically 2 (two) times daily. Patient not taking: Reported on 07/15/2021 12/31/16   Oneta Rack, NP  cyclobenzaprine (FLEXERIL) 10 MG tablet Take 1 tablet (10 mg total)  by mouth 2 (two) times daily as needed for muscle spasms. 08/01/23   White, Elizabeth A, PA-C  hydrOXYzine (ATARAX/VISTARIL) 25 MG tablet Take 1 tablet (25 mg total) by mouth every 6 (six) hours as needed for anxiety. Patient not taking: Reported on 07/15/2021 12/31/16   Oneta Rack, NP  predniSONE (STERAPRED UNI-PAK 21 TAB) 10 MG (21) TBPK tablet Take by mouth daily. Take 6 tabs by mouth daily for 2 days, then 5 tabs for 2 days, then 4 tabs for 2 days, then 3 tabs for 2 days, 2 tabs for 2 days, then 1 tab by mouth daily for 2 days 08/01/23   Landis Martins, PA-C  TRINTELLIX 20 MG TABS tablet Take 20 mg by mouth daily. 10/24/19   [provider]      Allergies    Azithromycin    Review of Systems   Review of Systems  Musculoskeletal:  Positive for back pain.    Physical Exam Updated Vital Signs BP 130/84 (BP Location: Left Arm)   Pulse (!) 106   Temp 98.4 F (36.9 C) (Oral)   Resp 17   LMP 07/31/2023   SpO2 100%  Physical Exam Constitutional:      General: She is not in acute distress.    Appearance: Normal appearance. She is not ill-appearing.  HENT:     Head: Normocephalic and atraumatic.     Nose: Nose normal.     Mouth/Throat:  Mouth: Mucous membranes are moist.     Pharynx: Oropharynx is clear.  Eyes:     Pupils: Pupils are equal, round, and reactive to light.  Pulmonary:     Effort: Pulmonary effort is normal.     Breath sounds: Normal breath sounds.  Abdominal:     Palpations: Abdomen is soft.     Tenderness: There is no abdominal tenderness. There is no guarding or rebound.  Musculoskeletal:        General: Tenderness present.     Comments: Tenderness to palpation over the right trapezius and bilateral paraspinal muscles.  No midline C, T, L-spine tenderness palpation and no bony tenderness palpation of the shoulder or scapula.  Bilateral radial pulses 2+, sensation and motor function intact in the bilateral hands  Skin:    General: Skin is warm  and dry.     Capillary Refill: Capillary refill takes less than 2 seconds.  Neurological:     General: No focal deficit present.     Mental Status: She is alert.     ED Results / Procedures / Treatments   Labs (all labs ordered are listed, but only abnormal results are displayed) Labs Reviewed - No data to display  EKG None  Radiology No results found.  Procedures Procedures  {Document cardiac monitor, telemetry assessment procedure when appropriate:1}  Medications Ordered in ED Medications  ketorolac (TORADOL) 15 MG/ML injection 15 mg (15 mg Intramuscular Given 08/05/23 1759)  acetaminophen (TYLENOL) tablet 1,000 mg (1,000 mg Oral Given 08/05/23 1759)    ED Course/ Medical Decision Making/ A&P   {   Click here for ABCD2, HEART and other calculatorsREFRESH Note before signing :1}                              Medical Decision Making Risk OTC drugs. Prescription drug management.   Vital signs stable, patient afebrile.  Physical exam concerning for muscular tenderness over the trapezius and paraspinal muscles.  I have low suspicion for bony injury and suspect the patient has muscle strain related to overuse.  She has no midline spinal tenderness concerning for spinal pathology.  She reported she received a Toradol injection at the urgent care that really helped her, this was administered again in the ER with Tylenol.  We discussed the importance of giving her arm time to rest.  Additionally, we discussed the importance of following up with her PCP and orthopedic surgery.  Discussed using an NSAID such as naproxen for pain control and inflammation relief with Tylenol and Flexeril as needed.  Encouraged her to continue taking prednisone as prescribed.  Information to follow-up with orthopedics was provided to the patient.  Strict return precautions were discussed, patient voiced understanding.  She was discharged in stable condition.  {Document critical care time when  appropriate:1} {Document review of labs and clinical decision tools ie heart score, Chads2Vasc2 etc:1}  {Document your independent review of radiology images, and any outside records:1} {Document your discussion with family members, caretakers, and with consultants:1} {Document social determinants of health affecting pt's care:1} {Document your decision making why or why not admission, treatments were needed:1} Final Clinical Impression(s) / ED Diagnoses Final diagnoses:  Acute pain of right shoulder    Rx / DC Orders ED Discharge Orders     None

## 2023-08-08 ENCOUNTER — Emergency Department (HOSPITAL_BASED_OUTPATIENT_CLINIC_OR_DEPARTMENT_OTHER): Payer: BC Managed Care – PPO

## 2023-08-08 ENCOUNTER — Ambulatory Visit (HOSPITAL_BASED_OUTPATIENT_CLINIC_OR_DEPARTMENT_OTHER): Admission: RE | Admit: 2023-08-08 | Payer: BC Managed Care – PPO | Source: Ambulatory Visit

## 2023-08-08 ENCOUNTER — Other Ambulatory Visit (HOSPITAL_BASED_OUTPATIENT_CLINIC_OR_DEPARTMENT_OTHER): Payer: Self-pay | Admitting: Family Medicine

## 2023-08-08 ENCOUNTER — Inpatient Hospital Stay (HOSPITAL_BASED_OUTPATIENT_CLINIC_OR_DEPARTMENT_OTHER)
Admission: EM | Admit: 2023-08-08 | Discharge: 2023-08-14 | DRG: 872 | Disposition: A | Discharge status: Home / Self Care | Payer: BC Managed Care – PPO | Attending: Family Medicine | Admitting: Family Medicine

## 2023-08-08 ENCOUNTER — Other Ambulatory Visit: Payer: Self-pay

## 2023-08-08 ENCOUNTER — Encounter (HOSPITAL_BASED_OUTPATIENT_CLINIC_OR_DEPARTMENT_OTHER): Payer: Self-pay | Admitting: Emergency Medicine

## 2023-08-08 DIAGNOSIS — Z818 Family history of other mental and behavioral disorders: Secondary | ICD-10-CM | POA: Diagnosis not present

## 2023-08-08 DIAGNOSIS — K828 Other specified diseases of gallbladder: Secondary | ICD-10-CM | POA: Diagnosis not present

## 2023-08-08 DIAGNOSIS — K820 Obstruction of gallbladder: Secondary | ICD-10-CM | POA: Diagnosis not present

## 2023-08-08 DIAGNOSIS — N10 Acute tubulo-interstitial nephritis: Secondary | ICD-10-CM | POA: Diagnosis not present

## 2023-08-08 DIAGNOSIS — Z23 Encounter for immunization: Secondary | ICD-10-CM

## 2023-08-08 DIAGNOSIS — R1031 Right lower quadrant pain: Secondary | ICD-10-CM | POA: Diagnosis not present

## 2023-08-08 DIAGNOSIS — E871 Hypo-osmolality and hyponatremia: Secondary | ICD-10-CM | POA: Diagnosis present

## 2023-08-08 DIAGNOSIS — D6959 Other secondary thrombocytopenia: Secondary | ICD-10-CM | POA: Diagnosis present

## 2023-08-08 DIAGNOSIS — R079 Chest pain, unspecified: Secondary | ICD-10-CM | POA: Diagnosis not present

## 2023-08-08 DIAGNOSIS — N179 Acute kidney failure, unspecified: Secondary | ICD-10-CM | POA: Diagnosis not present

## 2023-08-08 DIAGNOSIS — K5900 Constipation, unspecified: Secondary | ICD-10-CM | POA: Diagnosis not present

## 2023-08-08 DIAGNOSIS — R6520 Severe sepsis without septic shock: Secondary | ICD-10-CM | POA: Diagnosis present

## 2023-08-08 DIAGNOSIS — E860 Dehydration: Secondary | ICD-10-CM | POA: Diagnosis present

## 2023-08-08 DIAGNOSIS — E876 Hypokalemia: Secondary | ICD-10-CM | POA: Diagnosis not present

## 2023-08-08 DIAGNOSIS — T39395A Adverse effect of other nonsteroidal anti-inflammatory drugs [NSAID], initial encounter: Secondary | ICD-10-CM | POA: Diagnosis present

## 2023-08-08 DIAGNOSIS — F418 Other specified anxiety disorders: Secondary | ICD-10-CM | POA: Diagnosis present

## 2023-08-08 DIAGNOSIS — M25511 Pain in right shoulder: Secondary | ICD-10-CM | POA: Diagnosis not present

## 2023-08-08 DIAGNOSIS — R1011 Right upper quadrant pain: Secondary | ICD-10-CM

## 2023-08-08 DIAGNOSIS — Z881 Allergy status to other antibiotic agents status: Secondary | ICD-10-CM | POA: Diagnosis not present

## 2023-08-08 DIAGNOSIS — R7989 Other specified abnormal findings of blood chemistry: Secondary | ICD-10-CM | POA: Diagnosis not present

## 2023-08-08 DIAGNOSIS — F332 Major depressive disorder, recurrent severe without psychotic features: Secondary | ICD-10-CM | POA: Diagnosis present

## 2023-08-08 DIAGNOSIS — R1084 Generalized abdominal pain: Secondary | ICD-10-CM | POA: Diagnosis not present

## 2023-08-08 DIAGNOSIS — Z8249 Family history of ischemic heart disease and other diseases of the circulatory system: Secondary | ICD-10-CM | POA: Diagnosis not present

## 2023-08-08 DIAGNOSIS — J9811 Atelectasis: Secondary | ICD-10-CM | POA: Diagnosis not present

## 2023-08-08 DIAGNOSIS — A4151 Sepsis due to Escherichia coli [E. coli]: Principal | ICD-10-CM | POA: Diagnosis present

## 2023-08-08 DIAGNOSIS — F900 Attention-deficit hyperactivity disorder, predominantly inattentive type: Secondary | ICD-10-CM | POA: Diagnosis present

## 2023-08-08 DIAGNOSIS — R0902 Hypoxemia: Secondary | ICD-10-CM | POA: Diagnosis not present

## 2023-08-08 DIAGNOSIS — Z87891 Personal history of nicotine dependence: Secondary | ICD-10-CM

## 2023-08-08 DIAGNOSIS — Z8349 Family history of other endocrine, nutritional and metabolic diseases: Secondary | ICD-10-CM | POA: Diagnosis not present

## 2023-08-08 DIAGNOSIS — K760 Fatty (change of) liver, not elsewhere classified: Secondary | ICD-10-CM | POA: Diagnosis not present

## 2023-08-08 DIAGNOSIS — D696 Thrombocytopenia, unspecified: Secondary | ICD-10-CM | POA: Diagnosis present

## 2023-08-08 DIAGNOSIS — R7881 Bacteremia: Secondary | ICD-10-CM | POA: Insufficient documentation

## 2023-08-08 DIAGNOSIS — D649 Anemia, unspecified: Secondary | ICD-10-CM | POA: Diagnosis present

## 2023-08-08 DIAGNOSIS — A419 Sepsis, unspecified organism: Secondary | ICD-10-CM | POA: Diagnosis not present

## 2023-08-08 DIAGNOSIS — N200 Calculus of kidney: Secondary | ICD-10-CM | POA: Diagnosis not present

## 2023-08-08 DIAGNOSIS — F4010 Social phobia, unspecified: Secondary | ICD-10-CM | POA: Diagnosis present

## 2023-08-08 DIAGNOSIS — E872 Acidosis, unspecified: Secondary | ICD-10-CM | POA: Insufficient documentation

## 2023-08-08 DIAGNOSIS — R9389 Abnormal findings on diagnostic imaging of other specified body structures: Secondary | ICD-10-CM | POA: Diagnosis not present

## 2023-08-08 DIAGNOSIS — R109 Unspecified abdominal pain: Secondary | ICD-10-CM | POA: Diagnosis not present

## 2023-08-08 DIAGNOSIS — R112 Nausea with vomiting, unspecified: Secondary | ICD-10-CM | POA: Diagnosis not present

## 2023-08-08 DIAGNOSIS — K810 Acute cholecystitis: Secondary | ICD-10-CM | POA: Diagnosis not present

## 2023-08-08 DIAGNOSIS — R0602 Shortness of breath: Secondary | ICD-10-CM | POA: Diagnosis not present

## 2023-08-08 DIAGNOSIS — N12 Tubulo-interstitial nephritis, not specified as acute or chronic: Secondary | ICD-10-CM | POA: Diagnosis not present

## 2023-08-08 DIAGNOSIS — M549 Dorsalgia, unspecified: Secondary | ICD-10-CM | POA: Diagnosis not present

## 2023-08-08 HISTORY — DX: Social phobia, unspecified: F40.10

## 2023-08-08 HISTORY — DX: Suicidal ideations: R45.851

## 2023-08-08 HISTORY — DX: Tobacco use: Z72.0

## 2023-08-08 HISTORY — DX: Major depressive disorder, recurrent, moderate: F33.1

## 2023-08-08 HISTORY — DX: Mental disorder, not otherwise specified: F99

## 2023-08-08 HISTORY — DX: Obesity, class 1: E66.811

## 2023-08-08 LAB — LIPASE, BLOOD: Lipase: <10 U/L — ABNORMAL LOW (ref 11–51)

## 2023-08-08 LAB — URINALYSIS, ROUTINE W REFLEX MICROSCOPIC
Bilirubin Urine: NEGATIVE
Glucose, UA: NEGATIVE mg/dL
Ketones, ur: NEGATIVE mg/dL
Nitrite: NEGATIVE
Protein, ur: 100 mg/dL — AB
Specific Gravity, Urine: 1.015 (ref 1.005–1.030)
Trans Epithel, UA: <1
WBC, UA: >50 WBC/hpf (ref 0–5)
pH: 6 (ref 5.0–8.0)

## 2023-08-08 LAB — CBC
HCT: 34.8 % — ABNORMAL LOW (ref 36.0–46.0)
Hemoglobin: 11.8 g/dL — ABNORMAL LOW (ref 12.0–15.0)
MCH: 28.5 pg (ref 26.0–34.0)
MCHC: 33.9 g/dL (ref 30.0–36.0)
MCV: 84.1 fL (ref 80.0–100.0)
Platelets: 127 10*3/uL — ABNORMAL LOW (ref 150–400)
RBC: 4.14 MIL/uL (ref 3.87–5.11)
RDW: 14.6 % (ref 11.5–15.5)
WBC: 19 10*3/uL — ABNORMAL HIGH (ref 4.0–10.5)
nRBC: 0.2 % (ref 0.0–0.2)

## 2023-08-08 LAB — COMPREHENSIVE METABOLIC PANEL
ALT: 92 U/L — ABNORMAL HIGH (ref 0–44)
AST: 32 U/L (ref 15–41)
Albumin: 3.2 g/dL — ABNORMAL LOW (ref 3.5–5.0)
Alkaline Phosphatase: 132 U/L — ABNORMAL HIGH (ref 38–126)
Anion gap: 12 (ref 5–15)
BUN: 32 mg/dL — ABNORMAL HIGH (ref 6–20)
CO2: 21 mmol/L — ABNORMAL LOW (ref 22–32)
Calcium: 8.5 mg/dL — ABNORMAL LOW (ref 8.9–10.3)
Chloride: 99 mmol/L (ref 98–111)
Creatinine, Ser: 2.25 mg/dL — ABNORMAL HIGH (ref 0.44–1.00)
GFR, Estimated: 30 mL/min — ABNORMAL LOW (ref >60)
Glucose, Bld: 139 mg/dL — ABNORMAL HIGH (ref 70–99)
Potassium: 3.8 mmol/L (ref 3.5–5.1)
Sodium: 132 mmol/L — ABNORMAL LOW (ref 135–145)
Total Bilirubin: 1.7 mg/dL — ABNORMAL HIGH (ref <1.2)
Total Protein: 5.9 g/dL — ABNORMAL LOW (ref 6.5–8.1)

## 2023-08-08 LAB — HCG, QUANTITATIVE, PREGNANCY: hCG, Beta Chain, Quant, S: 1 m[IU]/mL (ref <5)

## 2023-08-08 LAB — PREGNANCY, URINE: Preg Test, Ur: NEGATIVE

## 2023-08-08 LAB — LACTIC ACID, PLASMA: Lactic Acid, Venous: 1.4 mmol/L (ref 0.5–1.9)

## 2023-08-08 MED ORDER — METRONIDAZOLE 500 MG/100ML IV SOLN
500.0000 mg | Freq: Once | INTRAVENOUS | Status: AC
  Administered 2023-08-08: 500 mg via INTRAVENOUS
  Filled 2023-08-08: qty 100

## 2023-08-08 MED ORDER — DEXTROSE-SODIUM CHLORIDE 5-0.9 % IV SOLN: INTRAVENOUS | Status: DC

## 2023-08-08 MED ORDER — SODIUM CHLORIDE 0.9 % IV BOLUS
1000.0000 mL | Freq: Once | INTRAVENOUS | Status: AC
  Administered 2023-08-08: 1000 mL via INTRAVENOUS

## 2023-08-08 MED ORDER — SODIUM CHLORIDE 0.9 % IV SOLN
2.0000 g | Freq: Once | INTRAVENOUS | Status: AC
  Administered 2023-08-08: 2 g via INTRAVENOUS
  Filled 2023-08-08: qty 20

## 2023-08-08 MED ORDER — FENTANYL CITRATE PF 50 MCG/ML IJ SOSY
50.0000 ug | PREFILLED_SYRINGE | Freq: Once | INTRAMUSCULAR | Status: AC
  Administered 2023-08-08: 50 ug via INTRAVENOUS
  Filled 2023-08-08: qty 1

## 2023-08-08 MED ORDER — ONDANSETRON HCL 4 MG/2ML IJ SOLN
4.0000 mg | Freq: Once | INTRAMUSCULAR | Status: AC
  Administered 2023-08-08: 4 mg via INTRAVENOUS
  Filled 2023-08-08: qty 2

## 2023-08-08 NOTE — Sepsis Progress Note (Signed)
Elink following for sepsis protocol. 

## 2023-08-08 NOTE — ED Notes (Signed)
ED Provider at bedside. 

## 2023-08-08 NOTE — ED Notes (Signed)
Patient transported to CT 

## 2023-08-08 NOTE — ED Notes (Signed)
 Blood cultures x2 drawn before starting antibiotics.

## 2023-08-08 NOTE — ED Triage Notes (Signed)
C/o R sided abd and r sided back pain. Was seen at PCP today and told to come to ED to r/o gallbladder issues. Also endorses n/v/d.

## 2023-08-08 NOTE — Progress Notes (Signed)
Hospitalist Transfer Note:    Nursing staff, Please call [facility] Admits & Consults System-Wide number on Amion [phone]) as soon as patient's arrival, so appropriate admitting provider can evaluate the pt.   Transferring facility: [facility] Requesting provider: Dr. Lockie Mola (EDP at [facility]) Reason for transfer: admission for further evaluation and management of acute right pyelonephritis versus acute cholecystitis.  ***  ***  (27 year old female   who is being transferred from  to [facility] or [facility] (first available) for further evaluation and management of suspected acute right pyelonephritis versus potential acute cholecystitis; [facility] EDP d/w on-call general surgery, Dr. Bedelia Person, who *** please see my progress note for additional details).     ***  year old *** with medical history notable for ***, who presented to *** ED complaining of  ***.   Vital signs in the ED were notable for the following: ***  Labs were notable for ***  Imaging notable for ***  Medications administered prior to transfer included the following: ***    Subsequently, I accepted this patient for transfer for ***observation/inpatient admission to a *** bed at [facility] or [facility] *** (first available) for further work-up and management of the above.   ***Reason/necessity for transfer on the basis of need for higher level of care and availability of specialist providers         Newton Pigg, DO Hospitalist

## 2023-08-08 NOTE — ED Provider Notes (Signed)
[place] [facility] DE[facility]MENT AT [facility] Provider Note   CSN: [id number] Arrival date & time: 08/08/23  1725     History  Chief Complaint  Patient presents with   Back Pain    Renee Mcdowell is a 27 y.o. female.  Patient here with right upper/right sided/flank pain for the last several days worse with eating.  Has had nausea and vomiting.  Is having some referred pain to her right shoulder.  She started having some right shoulder pain but now developed some abdominal pain.  Was seen a few days ago but was not having as bad abdominal pains.  She is sent here for further eval for may be gallbladder infection or appendicitis.  She denies any pain at urination.  Denies any history of kidney stone.  History of anxiety and depression.  She has had fever for the last few days as well.  The history is provided by the patient.       Home Medications Prior to Admission medications   Medication Sig Start Date End Date Taking? Authorizing Provider  amphetamine-dextroamphetamine (ADDERALL XR) 20 MG 24 hr capsule Take 20 mg by mouth 3 (three) times daily.    [provider]  bacitracin ointment Apply topically 2 (two) times daily. Patient not taking: Reported on 07/15/2021 12/31/16   Oneta Rack, NP  cyclobenzaprine (FLEXERIL) 10 MG tablet Take 1 tablet (10 mg total) by mouth 2 (two) times daily as needed for muscle spasms. 08/01/23   White, Elizabeth A, PA-C  hydrOXYzine (ATARAX/VISTARIL) 25 MG tablet Take 1 tablet (25 mg total) by mouth every 6 (six) hours as needed for anxiety. Patient not taking: Reported on 07/15/2021 12/31/16   Oneta Rack, NP  predniSONE (STERAPRED UNI-PAK 21 TAB) 10 MG (21) TBPK tablet Take by mouth daily. Take 6 tabs by mouth daily for 2 days, then 5 tabs for 2 days, then 4 tabs for 2 days, then 3 tabs for 2 days, 2 tabs for 2 days, then 1 tab by mouth daily for 2 days 08/01/23   Landis Martins, PA-C  TRINTELLIX 20 MG TABS tablet Take 20  mg by mouth daily. 10/24/19   [provider]      Allergies    Azithromycin    Review of Systems   Review of Systems  Physical Exam Updated Vital Signs BP 130/77   Pulse (!) 105   Temp 98.7 F (37.1 C) (Oral)   Resp 17   Ht 5\' 6"  (1.676 m)   Wt 72.6 kg   LMP 07/31/2023   SpO2 93% Comment: Simultaneous filing. User may not have seen previous data.  BMI 25.82 kg/m  Physical Exam Vitals and nursing note reviewed.  Constitutional:      General: She is not in acute distress.    Appearance: She is well-developed. She is not ill-appearing.  HENT:     Head: Normocephalic and atraumatic.     Nose: Nose normal.     Mouth/Throat:     Mouth: Mucous membranes are moist.  Eyes:     Extraocular Movements: Extraocular movements intact.     Conjunctiva/sclera: Conjunctivae normal.     Pupils: Pupils are equal, round, and reactive to light.  Cardiovascular:     Rate and Rhythm: Normal rate and regular rhythm.     Pulses: Normal pulses.     Heart sounds: Normal heart sounds. No murmur heard. Pulmonary:     Effort: Pulmonary effort is normal. No respiratory distress.  Breath sounds: Normal breath sounds.  Abdominal:     Palpations: Abdomen is soft.     Tenderness: There is abdominal tenderness. There is right CVA tenderness.  Musculoskeletal:        General: No swelling.     Cervical back: Normal range of motion and neck supple.  Skin:    General: Skin is warm and dry.     Capillary Refill: Capillary refill takes less than 2 seconds.  Neurological:     Mental Status: She is alert.  Psychiatric:        Mood and Affect: Mood normal.     ED Results / Procedures / Treatments   Labs (all labs ordered are listed, but only abnormal results are displayed) Labs Reviewed  LIPASE, BLOOD - Abnormal; Notable for the following components:      Result Value   Lipase <10 (*)    All other components within normal limits  COMPREHENSIVE METABOLIC PANEL - Abnormal; Notable for  the following components:   Sodium 132 (*)    CO2 21 (*)    Glucose, Bld 139 (*)    BUN 32 (*)    Creatinine, Ser 2.25 (*)    Calcium 8.5 (*)    Total Protein 5.9 (*)    Albumin 3.2 (*)    ALT 92 (*)    Alkaline Phosphatase 132 (*)    Total Bilirubin 1.7 (*)    GFR, Estimated 30 (*)    All other components within normal limits  CBC - Abnormal; Notable for the following components:   WBC 19.0 (*)    Hemoglobin 11.8 (*)    HCT 34.8 (*)    Platelets 127 (*)    All other components within normal limits  URINALYSIS, ROUTINE W REFLEX MICROSCOPIC - Abnormal; Notable for the following components:   APPearance HAZY (*)    Hgb urine dipstick MODERATE (*)    Protein, ur 100 (*)    Leukocytes,Ua LARGE (*)    Bacteria, UA RARE (*)    All other components within normal limits  CULTURE, BLOOD (ROUTINE X 2)  CULTURE, BLOOD (ROUTINE X 2)  URINE CULTURE  PREGNANCY, URINE  HCG, QUANTITATIVE, PREGNANCY  LACTIC ACID, PLASMA    EKG None  Radiology CT Renal Stone Study  Result Date: 08/08/2023 CLINICAL DATA:  Right-sided abdominal pain and right-sided back pain. EXAM: CT ABDOMEN AND PELVIS WITHOUT CONTRAST TECHNIQUE: Multidetector CT imaging of the abdomen and pelvis was performed following the standard protocol without IV contrast. RADIATION DOSE REDUCTION: This exam was performed according to the departmental dose-optimization program which includes automated exposure control, adjustment of the mA and/or kV according to patient size and/or use of iterative reconstruction technique. COMPARISON:  July 15, 2021 FINDINGS: Lower chest: Mild atelectatic changes are seen within the posterior aspect of the left lung base. Hepatobiliary: No focal liver abnormality is seen. The gallbladder is contracted without evidence of gallstones, gallbladder wall thickening, or biliary dilatation. Pancreas: Unremarkable. No pancreatic ductal dilatation or surrounding inflammatory changes. Spleen: Normal in size  without focal abnormality. Adrenals/Urinary Tract: Adrenal glands are unremarkable. There is mild asymmetric enlargement of the right kidney without focal lesions, hydronephrosis or obstructing renal calculi. 2 mm and 3 mm nonobstructing renal calculi are seen within the mid and lower right kidney. There is mild right-sided perinephric inflammatory fat stranding. Bladder is unremarkable. Stomach/Bowel: Stomach is within normal limits. The appendix is not clearly identified. No evidence of bowel dilatation. Mild inflammatory fat stranding is seen along  the posterolateral aspect of the right lower quadrant and pelvis on the right. Vascular/Lymphatic: No significant vascular findings are present. No enlarged abdominal or pelvic lymph nodes. Reproductive: Uterus and bilateral adnexa are unremarkable. Other: No abdominal wall hernia or abnormality. A very small amount of posterior pelvic free fluid is seen. Musculoskeletal: No acute or significant osseous findings. IMPRESSION: 1. Mild asymmetric enlargement of the right kidney with mild right-sided perinephric inflammatory fat stranding. While this may be secondary to a recently passed renal stone, sequelae associated with acute pyelonephritis cannot be excluded. Correlation with urinalysis is recommended. 2. 2 mm and 3 mm nonobstructing right renal calculi. 3. Mild inflammatory fat stranding along the posterolateral aspect of the right lower quadrant and pelvis on the right. Without visualization of a normal appendix, appendicitis cannot be excluded. Further evaluation with follow-up abdomen pelvis CT following oral and intravenous contrast is recommended if appendicitis is of clinical concern. 4. Very small amount of posterior pelvic free fluid, likely physiologic. Electronically Signed   By: Aram Candela M.D.   On: 08/08/2023 22:44   US Abdomen Limited RUQ (LIVER/GB)  Result Date: 08/08/2023 CLINICAL DATA:  Right upper quadrant pain and back pain. EXAM:  ULTRASOUND ABDOMEN LIMITED RIGHT UPPER QUADRANT COMPARISON:  None Available. FINDINGS: Gallbladder: The gallbladder is partially contracted with gallbladder wall thickness of 2.6 mm. No gallstones are visualized. A mild amount of pericholecystic fluid is noted. No sonographic Murphy sign noted by sonographer. Common bile duct: Diameter: 4.7 mm Liver: No focal lesion identified. Within normal limits in parenchymal echogenicity. Portal vein is patent on color Doppler imaging with normal direction of blood flow towards the liver. Other: None. IMPRESSION: 1. Partially contracted gallbladder with subsequent gallbladder wall thickening and mild pericholecystic fluid. 2. No evidence of cholelithiasis or acute cholecystitis. Electronically Signed   By: Aram Candela M.D.   On: 08/08/2023 22:38    Procedures .Critical Care  Performed by: Virgina Norfolk, DO Authorized by: Virgina Norfolk, DO   Critical care provider statement:    Critical care time (minutes):  40   Critical care was necessary to treat or prevent imminent or life-threatening deterioration of the following conditions:  Sepsis   Critical care was time spent personally by me on the following activities:  Blood draw for specimens, development of treatment plan with patient or surrogate, discussions with consultants, discussions with primary provider, evaluation of patient's response to treatment, examination of patient, obtaining history from patient or surrogate, ordering and performing treatments and interventions, ordering and review of laboratory studies, ordering and review of radiographic studies, pulse oximetry, re-evaluation of patient's condition and review of old charts   I assumed direction of critical care for this patient from another provider in my specialty: no     Care discussed with: admitting provider       Medications Ordered in ED Medications  dextrose 5 %-0.9 % sodium chloride infusion ( Intravenous New Bag/Given 08/08/23  2224)  metroNIDAZOLE (FLAGYL) IVPB 500 mg (500 mg Intravenous New Bag/Given 08/08/23 2313)  sodium chloride 0.9 % bolus 1,000 mL (0 mLs Intravenous Stopped 08/08/23 2029)  cefTRIAXone (ROCEPHIN) 2 g in sodium chloride 0.9 % 100 mL IVPB (0 g Intravenous Stopped 08/08/23 2025)  fentaNYL (SUBLIMAZE) injection 50 mcg (50 mcg Intravenous Given 08/08/23 1926)  fentaNYL (SUBLIMAZE) injection 50 mcg (50 mcg Intravenous Given 08/08/23 2219)  ondansetron (ZOFRAN) injection 4 mg (4 mg Intravenous Given 08/08/23 2234)  sodium chloride 0.9 % bolus 1,000 mL (1,000 mLs Intravenous New Bag/Given 08/08/23  2314)    ED Course/ Medical Decision Making/ A&P                                 Medical Decision Making Amount and/or Complexity of Data Reviewed Labs: ordered. Radiology: ordered.  Risk Prescription drug management. Decision regarding hospitalization.   Renee Mcdowell is here with abdominal pain.  Patient arrives with low-grade fever.  Overall she is already had some blood work done with a white count of 19.  I will pursue infectious workup given her abdominal pain fever and white count.  Will start IV antibiotics.  Sepsis workup initiated and I suspect either cholecystitis or infected kidney stone or less likely appendicitis.Will get lab work, CT scan abdomen pelvis as well as ultrasound of the gallbladder.  Will give IV fentanyl, Zofran and fluid bolus.  She does have an AKI with a creatinine of 2.25.  Per my review and interpretation of labs she does have a white count of 19.  Lactic acid is however normal.  Urinalysis positive for leukocytes and some bacteria.  It is possible that this could be a pyelonephritis.  Liver enzymes with ALT of 92 alk phos of 132 and T. bili of 1.7.  Overall ultrasound and CT scan are somewhat equivocal.  CT scan per radiology report could be pyelonephritis.  There is no obstructive kidney stone.  It is possible that she had a kidney stone that passed and now with a urine  infection.  Radiology could not also confidently rule out appendicitis given no contrast but clinically have a low suspicion for this.  Ultrasound showed partially contracted gallbladder with subsequent gallbladder wall thickening and mild pericholecystic fluid but no evidence of cholelithiasis or acute cholecystitis.  Dr. Dr. Bedelia Person with general surgery who recommended a HIDA scan.  We have broaden antibiotics to include Rocephin and Flagyl.  Will send a urine culture.  Will hydrate her and consider repeating the CT scan when her kidney functions normalized to further evaluate for appendicitis but this seems less likely.  Further care.  Patient does meet sepsis criteria with tachycardia white count and suspected either gallbladder or urinary infection.  This chart was dictated using voice recognition software.  Despite best efforts to proofread,  errors can occur which can change the documentation meaning.   This chart was dictated using voice recognition software.  Despite best efforts to proofread,  errors can occur which can change the documentation meaning.         Final Clinical Impression(s) / ED Diagnoses Final diagnoses:  Sepsis, due to unspecified organism, unspecified whether acute organ dysfunction present [facility])  Pyelonephritis  RUQ pain    Rx / DC Orders ED Discharge Orders     None         Virgina Norfolk, DO 08/08/23 2319

## 2023-08-09 ENCOUNTER — Encounter (HOSPITAL_COMMUNITY): Payer: Self-pay | Admitting: Family Medicine

## 2023-08-09 DIAGNOSIS — E871 Hypo-osmolality and hyponatremia: Secondary | ICD-10-CM | POA: Diagnosis present

## 2023-08-09 DIAGNOSIS — F900 Attention-deficit hyperactivity disorder, predominantly inattentive type: Secondary | ICD-10-CM | POA: Diagnosis present

## 2023-08-09 DIAGNOSIS — A4151 Sepsis due to Escherichia coli [E. coli]: Secondary | ICD-10-CM | POA: Diagnosis present

## 2023-08-09 DIAGNOSIS — E860 Dehydration: Secondary | ICD-10-CM | POA: Diagnosis present

## 2023-08-09 DIAGNOSIS — R6520 Severe sepsis without septic shock: Secondary | ICD-10-CM | POA: Diagnosis present

## 2023-08-09 DIAGNOSIS — R0902 Hypoxemia: Secondary | ICD-10-CM | POA: Diagnosis not present

## 2023-08-09 DIAGNOSIS — Z8249 Family history of ischemic heart disease and other diseases of the circulatory system: Secondary | ICD-10-CM | POA: Diagnosis not present

## 2023-08-09 DIAGNOSIS — Z23 Encounter for immunization: Secondary | ICD-10-CM | POA: Diagnosis not present

## 2023-08-09 DIAGNOSIS — T39395A Adverse effect of other nonsteroidal anti-inflammatory drugs [NSAID], initial encounter: Secondary | ICD-10-CM | POA: Diagnosis present

## 2023-08-09 DIAGNOSIS — Z8349 Family history of other endocrine, nutritional and metabolic diseases: Secondary | ICD-10-CM | POA: Diagnosis not present

## 2023-08-09 DIAGNOSIS — F332 Major depressive disorder, recurrent severe without psychotic features: Secondary | ICD-10-CM | POA: Diagnosis present

## 2023-08-09 DIAGNOSIS — F4010 Social phobia, unspecified: Secondary | ICD-10-CM | POA: Diagnosis present

## 2023-08-09 DIAGNOSIS — N12 Tubulo-interstitial nephritis, not specified as acute or chronic: Secondary | ICD-10-CM

## 2023-08-09 DIAGNOSIS — N179 Acute kidney failure, unspecified: Secondary | ICD-10-CM | POA: Diagnosis present

## 2023-08-09 DIAGNOSIS — M549 Dorsalgia, unspecified: Secondary | ICD-10-CM | POA: Diagnosis present

## 2023-08-09 DIAGNOSIS — Z87891 Personal history of nicotine dependence: Secondary | ICD-10-CM | POA: Diagnosis not present

## 2023-08-09 DIAGNOSIS — F418 Other specified anxiety disorders: Secondary | ICD-10-CM | POA: Diagnosis present

## 2023-08-09 DIAGNOSIS — Z881 Allergy status to other antibiotic agents status: Secondary | ICD-10-CM | POA: Diagnosis not present

## 2023-08-09 DIAGNOSIS — E876 Hypokalemia: Secondary | ICD-10-CM | POA: Diagnosis not present

## 2023-08-09 DIAGNOSIS — D696 Thrombocytopenia, unspecified: Secondary | ICD-10-CM | POA: Diagnosis present

## 2023-08-09 DIAGNOSIS — D6959 Other secondary thrombocytopenia: Secondary | ICD-10-CM | POA: Diagnosis present

## 2023-08-09 DIAGNOSIS — K5900 Constipation, unspecified: Secondary | ICD-10-CM | POA: Diagnosis not present

## 2023-08-09 DIAGNOSIS — Z818 Family history of other mental and behavioral disorders: Secondary | ICD-10-CM | POA: Diagnosis not present

## 2023-08-09 DIAGNOSIS — J9811 Atelectasis: Secondary | ICD-10-CM | POA: Diagnosis present

## 2023-08-09 DIAGNOSIS — D649 Anemia, unspecified: Secondary | ICD-10-CM | POA: Diagnosis present

## 2023-08-09 LAB — BASIC METABOLIC PANEL
Anion gap: 12 (ref 5–15)
BUN: 34 mg/dL — ABNORMAL HIGH (ref 6–20)
CO2: 15 mmol/L — ABNORMAL LOW (ref 22–32)
Calcium: 7.7 mg/dL — ABNORMAL LOW (ref 8.9–10.3)
Chloride: 105 mmol/L (ref 98–111)
Creatinine, Ser: 2.68 mg/dL — ABNORMAL HIGH (ref 0.44–1.00)
GFR, Estimated: 24 mL/min — ABNORMAL LOW (ref >60)
Glucose, Bld: 67 mg/dL — ABNORMAL LOW (ref 70–99)
Potassium: 4 mmol/L (ref 3.5–5.1)
Sodium: 132 mmol/L — ABNORMAL LOW (ref 135–145)

## 2023-08-09 LAB — CBC
HCT: 33.1 % — ABNORMAL LOW (ref 36.0–46.0)
Hemoglobin: 10.8 g/dL — ABNORMAL LOW (ref 12.0–15.0)
MCH: 29 pg (ref 26.0–34.0)
MCHC: 32.6 g/dL (ref 30.0–36.0)
MCV: 89 fL (ref 80.0–100.0)
Platelets: 98 10*3/uL — ABNORMAL LOW (ref 150–400)
RBC: 3.72 MIL/uL — ABNORMAL LOW (ref 3.87–5.11)
RDW: 14.9 % (ref 11.5–15.5)
WBC: 15.6 10*3/uL — ABNORMAL HIGH (ref 4.0–10.5)
nRBC: 0 % (ref 0.0–0.2)

## 2023-08-09 LAB — BLOOD CULTURE ID PANEL (REFLEXED) - BCID2

## 2023-08-09 LAB — PHOSPHORUS: Phosphorus: 2.5 mg/dL (ref 2.5–4.6)

## 2023-08-09 LAB — COMPREHENSIVE METABOLIC PANEL
ALT: 70 U/L — ABNORMAL HIGH (ref 0–44)
AST: 28 U/L (ref 15–41)
Albumin: 2.1 g/dL — ABNORMAL LOW (ref 3.5–5.0)
Alkaline Phosphatase: 128 U/L — ABNORMAL HIGH (ref 38–126)
Anion gap: 4 — ABNORMAL LOW (ref 5–15)
BUN: 33 mg/dL — ABNORMAL HIGH (ref 6–20)
CO2: 20 mmol/L — ABNORMAL LOW (ref 22–32)
Calcium: 7.2 mg/dL — ABNORMAL LOW (ref 8.9–10.3)
Chloride: 108 mmol/L (ref 98–111)
Creatinine, Ser: 2.33 mg/dL — ABNORMAL HIGH (ref 0.44–1.00)
GFR, Estimated: 28 mL/min — ABNORMAL LOW (ref >60)
Glucose, Bld: 111 mg/dL — ABNORMAL HIGH (ref 70–99)
Potassium: 3.2 mmol/L — ABNORMAL LOW (ref 3.5–5.1)
Sodium: 132 mmol/L — ABNORMAL LOW (ref 135–145)
Total Bilirubin: 1.2 mg/dL — ABNORMAL HIGH (ref <1.2)
Total Protein: 5.2 g/dL — ABNORMAL LOW (ref 6.5–8.1)

## 2023-08-09 LAB — HIV ANTIBODY (ROUTINE TESTING W REFLEX): HIV Screen 4th Generation wRfx: NONREACTIVE

## 2023-08-09 LAB — MAGNESIUM: Magnesium: 2.3 mg/dL (ref 1.7–2.4)

## 2023-08-09 MED ORDER — HEPARIN SODIUM (PORCINE) 5000 UNIT/ML IJ SOLN
5000.0000 [IU] | Freq: Three times a day (TID) | INTRAMUSCULAR | Status: DC
  Administered 2023-08-09: 5000 [IU] via SUBCUTANEOUS
  Filled 2023-08-09: qty 1

## 2023-08-09 MED ORDER — METRONIDAZOLE 500 MG/100ML IV SOLN
500.0000 mg | Freq: Two times a day (BID) | INTRAVENOUS | Status: DC
  Administered 2023-08-09 – 2023-08-11 (×5): 500 mg via INTRAVENOUS
  Filled 2023-08-09 (×5): qty 100

## 2023-08-09 MED ORDER — LIDOCAINE 5 % EX PTCH
2.0000 | MEDICATED_PATCH | Freq: Every day | CUTANEOUS | Status: DC
  Administered 2023-08-10 – 2023-08-14 (×5): 2 via TRANSDERMAL
  Filled 2023-08-09 (×5): qty 2

## 2023-08-09 MED ORDER — HYDROMORPHONE HCL 1 MG/ML IJ SOLN
0.5000 mg | INTRAMUSCULAR | Status: DC | PRN
Start: 2023-08-09 — End: 2023-08-11
  Administered 2023-08-09 – 2023-08-11 (×14): 1 mg via INTRAVENOUS
  Filled 2023-08-09 (×14): qty 1

## 2023-08-09 MED ORDER — FENTANYL CITRATE PF 50 MCG/ML IJ SOSY
25.0000 ug | PREFILLED_SYRINGE | Freq: Once | INTRAMUSCULAR | Status: AC
  Administered 2023-08-09: 25 ug via INTRAVENOUS
  Filled 2023-08-09: qty 1

## 2023-08-09 MED ORDER — PANTOPRAZOLE SODIUM 40 MG IV SOLR
40.0000 mg | INTRAVENOUS | Status: DC
  Administered 2023-08-09 – 2023-08-11 (×3): 40 mg via INTRAVENOUS
  Filled 2023-08-09 (×3): qty 10

## 2023-08-09 MED ORDER — SODIUM CHLORIDE 0.9 % IV SOLN: INTRAVENOUS | Status: DC

## 2023-08-09 MED ORDER — ACETAMINOPHEN 500 MG PO TABS
1000.0000 mg | ORAL_TABLET | Freq: Four times a day (QID) | ORAL | Status: DC | PRN
  Administered 2023-08-09 – 2023-08-14 (×6): 1000 mg via ORAL
  Filled 2023-08-09 (×6): qty 2

## 2023-08-09 MED ORDER — ONDANSETRON HCL 4 MG PO TABS
4.0000 mg | ORAL_TABLET | Freq: Four times a day (QID) | ORAL | Status: DC | PRN
  Administered 2023-08-10 – 2023-08-14 (×2): 4 mg via ORAL
  Filled 2023-08-09 (×2): qty 1

## 2023-08-09 MED ORDER — ACETAMINOPHEN 325 MG PO TABS
650.0000 mg | ORAL_TABLET | Freq: Four times a day (QID) | ORAL | Status: DC | PRN
  Administered 2023-08-09: 650 mg via ORAL
  Filled 2023-08-09: qty 2

## 2023-08-09 MED ORDER — VORTIOXETINE HBR 5 MG PO TABS
20.0000 mg | ORAL_TABLET | Freq: Every day | ORAL | Status: DC
  Administered 2023-08-09 – 2023-08-14 (×6): 20 mg via ORAL
  Filled 2023-08-09 (×6): qty 4

## 2023-08-09 MED ORDER — LIDOCAINE 5 % EX PTCH
1.0000 | MEDICATED_PATCH | Freq: Every day | CUTANEOUS | Status: DC
  Administered 2023-08-09: 1 via TRANSDERMAL
  Filled 2023-08-09: qty 1

## 2023-08-09 MED ORDER — SODIUM CHLORIDE 0.9 % IV SOLN: INTRAVENOUS | Status: AC

## 2023-08-09 MED ORDER — ACETAMINOPHEN 650 MG RE SUPP: 650.0000 mg | Freq: Four times a day (QID) | RECTAL | Status: DC | PRN

## 2023-08-09 MED ORDER — SODIUM CHLORIDE 0.9 % IV SOLN
INTRAVENOUS | Status: DC
Start: 2023-08-09 — End: 2023-08-12

## 2023-08-09 MED ORDER — LACTATED RINGERS IV BOLUS
1000.0000 mL | Freq: Once | INTRAVENOUS | Status: AC
  Administered 2023-08-09: 1000 mL via INTRAVENOUS

## 2023-08-09 MED ORDER — MELATONIN 5 MG PO TABS
5.0000 mg | ORAL_TABLET | Freq: Once | ORAL | Status: AC
  Administered 2023-08-09: 5 mg via ORAL
  Filled 2023-08-09: qty 1

## 2023-08-09 MED ORDER — CHLORPROMAZINE HCL 25 MG/ML IJ SOLN: 25.0000 mg | Freq: Three times a day (TID) | INTRAMUSCULAR | Status: DC | PRN

## 2023-08-09 MED ORDER — METHOCARBAMOL 500 MG PO TABS
500.0000 mg | ORAL_TABLET | Freq: Three times a day (TID) | ORAL | Status: DC
  Administered 2023-08-09 – 2023-08-14 (×15): 500 mg via ORAL
  Filled 2023-08-09 (×15): qty 1

## 2023-08-09 MED ORDER — SODIUM CHLORIDE 0.9 % IV SOLN
2.0000 g | INTRAVENOUS | Status: DC
  Administered 2023-08-09 – 2023-08-10 (×2): 2 g via INTRAVENOUS
  Filled 2023-08-09 (×2): qty 20

## 2023-08-09 MED ORDER — INFLUENZA VIRUS VACC SPLIT PF (FLUZONE) 0.5 ML IM SUSY
0.5000 mL | PREFILLED_SYRINGE | INTRAMUSCULAR | Status: AC
  Administered 2023-08-13: 0.5 mL via INTRAMUSCULAR
  Filled 2023-08-09: qty 0.5

## 2023-08-09 MED ORDER — ONDANSETRON HCL 4 MG/2ML IJ SOLN
4.0000 mg | Freq: Four times a day (QID) | INTRAMUSCULAR | Status: DC | PRN
  Administered 2023-08-09 – 2023-08-14 (×6): 4 mg via INTRAVENOUS
  Filled 2023-08-09 (×6): qty 2

## 2023-08-09 MED ORDER — POTASSIUM CHLORIDE 10 MEQ/100ML IV SOLN
10.0000 meq | INTRAVENOUS | Status: AC
  Administered 2023-08-09 (×4): 10 meq via INTRAVENOUS
  Filled 2023-08-09: qty 100

## 2023-08-09 MED ORDER — OXYCODONE HCL 5 MG PO TABS
5.0000 mg | ORAL_TABLET | ORAL | Status: DC | PRN
  Administered 2023-08-09 – 2023-08-11 (×5): 5 mg via ORAL
  Filled 2023-08-09 (×5): qty 1

## 2023-08-09 NOTE — Progress Notes (Signed)
[facility] Progress Note Patient: Renee Mcdowell:[id number] DOB: Aug 27, 1996 DOA: 08/08/2023  DOS: the patient was seen and examined on 08/09/2023  Brief hospital course: PMH of ADHD, anxiety, depression presented to the hospital with complaints of right-sided back pain, nausea and vomiting. 11/25 seen in the ED with complaints of right sided mid back pain..  Thought to be trapezius muscle straining secondary to overexertion.  (She is currently in the process of moving).  Was discharged with Flexeril and prednisone. 11/29 seen again for the same pain now worsening.  Discharged back home. After going home started to have multiple vomiting episode with worsening pain. 12/2 present again to ED this time with pain involving right upper abdomen area especially after eating.  Was actually seen by PCP who ordered her to go to ED and recommended CT abdomen. Currently being treated for E. coli sepsis, AKI, suspected etiology is pyonephritis versus cholecystitis.  General surgery following.  Assessment and Plan: Severe sepsis secondary to E. coli bacteremia, potential etiology right-sided pyonephritis versus cholecystitis. Presents with right-sided pain.  Has hiccups.  No significant CVA tenderness.  No significant stone seen on the CT renal protocol but does have inflammation around kidneys as well as in the pelvic region. Blood cultures are growing E. coli. Met SIRS criteria on admission with leukocytosis, tachycardia. Organ damage with AKI and thrombocytopenia. For now we will continue with IV antibiotic. Follow-up on cultures for sensitivity. Continue with IV fluids. General Surgery consulted, recommended HIDA scan to rule out cholecystitis.  Remains n.p.o. for now. Continue pain control.  Concern for appendicitis CT renal study also mention about possibility of appendicitis although symptoms do not correlate with that. Currently on appropriate antibiotic for appendicitis. Management  per surgery.  Acute kidney injury. Baseline serum creatinine normal 0.7. On admission serum creatinine 2.2, currently worsening to 2.3. BUN also elevated. Likely in the setting of sepsis.  No obstruction seen on the CT renal protocol. Will continue with aggressive IV hydration. Renal dose of the medications.  Avoid hypotension. Mild hyponatremia as well as hypokalemia associated with the same.  Elevated LFT. Bilirubin 1.7, AST normal, ALT mildly elevated. Currently parameters are improving. Possibility of cholecystitis cannot be ruled out. For now monitor clinically.  Acute thrombocytopenia. Platelet count at baseline around 300. Presentation platelet count 127.  Now dropping down to 98. Likely in the setting of gram-negative sepsis. Monitor.  Anemia-dilutional. Hemoglobin on admission 11.8.  Now 10.8.  Likely dilutional.  Monitor.  Hypoxia.  Saturating 88% on room air. Likely atelectasis in the setting of inability to take deep breaths secondary to pain in the right upper quadrant area. Also her hiccups is likely secondary to the same as well. Continue oxygen support as needed. Incentive spirometry. Pain control.  Depression. Will continue home regimen.  ADHD. Medications are currently on hold.  Subjective: Reports hiccups, right-sided uncontrolled pain, nausea.  Reports chills but no fever so far.  No vomiting in the hospital.  Physical Exam: General: in severe distress, No Rash Cardiovascular: S1 and S2 Present, No Murmur Respiratory: Increased respiratory effort, Bilateral Air entry present.  Basal crackles, No wheezes Abdomen: Bowel Sound present, right-sided tenderness Extremities: No edema Neuro: Alert and oriented x3, no new focal deficit  Data Reviewed: I have Reviewed nursing notes, Vitals, and Lab results. Since last encounter, pertinent lab results CBC and BMP   . I have ordered test including CBC and CMP  . I have discussed pt's care plan and test  results with general  surgery  . I have ordered imaging HIDA scan  .  Disposition: Status is: Inpatient Remains inpatient appropriate because: Monitor for improvement in sepsis  Place and maintain sequential compression device Start: 08/09/23 0850   Family Communication: Multiple family members at bedside Level of care: Med-Surg   Vitals:   08/09/23 0110 08/09/23 0500 08/09/23 0530 08/09/23 1931  BP: 131/86  122/76 (!) 143/94  Pulse: (!) 109  (!) 121 (!) 108  Resp: 20  20 18   Temp: 98.5 F (36.9 C)  97.8 F (36.6 C) 97.8 F (36.6 C)  TempSrc: Oral  Oral Oral  SpO2: 99%  92% 97%  Weight:  79 kg    Height:         Author: Lynden Oxford, MD 08/09/2023 7:45 PM  Please look on [email or web address] to find out who is on call.

## 2023-08-09 NOTE — H&P (Signed)
History and Physical    DARYAH KLAMM ZOX:[id number] DOB: 1996/04/29 DOA: 08/08/2023  PCP: [name], MD   Patient coming from: Home   Chief Complaint: Abdominal pain, flank pain, N/V, chills   HPI: Renee Mcdowell is a 27 y.o. female with medical history significant for depression, anxiety, and ADHD who presents with abdominal pain, flank pain, nausea, vomiting, and chills.  Patient had been experiencing severe pain in the right upper back, was seen at an urgent care for this on 08/01/2023, diagnosed with muscle sprain, treated with Toradol and Decadron, and discharged with prednisone and Flexeril.  She presented to the ED on 08/05/2023 and a shoulder sling with the same complaints, diagnosed with muscle strain, and treated with Toradol.  Since then, she has developed epigastric pain, right-sided abdominal pain, right flank pain, nausea, vomiting, and chills.   Pain is worse with movement, deep breath, and 1 hour after eating.  She has not taken her temperature but has had severe chills at times.  She was experiencing dysuria a couple days ago, but did not notice that today.  She has never experienced this previously.  [facility] ED Course: Upon arrival to the ED, patient is found to be afebrile and saturating well on room air with mild tachycardia and stable blood pressure.  Labs are most notable for WBC 19,000, normal lactic acid, creatinine 2.25, ALT 92, and total bilirubin 1.7.  Chest x-ray is negative for acute findings.  There is no evidence for cholelithiasis or cholecystitis on right upper quadrant ultrasound.  CT of the abdomen and pelvis is most notable for asymmetric enlargement of the right kidney with perinephric stranding.  Surgery (Dr. Bedelia Person) was consulted by the ED physician, blood and urine cultures were collected, patient was treated with Rocephin, Flagyl, IV fluids, and fentanyl, and she was transferred to [facility] for admission.  Review of  Systems:  All other systems reviewed and apart from HPI, are negative.  Past Medical History:  Diagnosis Date   ADHD (attention deficit hyperactivity disorder)    Anxiety    Depression     History reviewed. No pertinent surgical history.  Social History:   reports that she has quit smoking. Her smoking use included cigarettes. She has never used smokeless tobacco. She reports current alcohol use of about 2.0 standard drinks of alcohol per week. She reports that she does not use drugs.  Allergies  Allergen Reactions   Azithromycin Hives    Family History  Problem Relation Age of Onset   Mental illness Father        Schizoaffective disorder   Thyroid disease Mother    Valvular heart disease Mother      Prior to Admission medications   Medication Sig Start Date End Date Taking? Authorizing Provider  amphetamine-dextroamphetamine (ADDERALL XR) 20 MG 24 hr capsule Take 20 mg by mouth 3 (three) times daily.    [provider]  bacitracin ointment Apply topically 2 (two) times daily. Patient not taking: Reported on 07/15/2021 12/31/16   Oneta Rack, NP  cyclobenzaprine (FLEXERIL) 10 MG tablet Take 1 tablet (10 mg total) by mouth 2 (two) times daily as needed for muscle spasms. 08/01/23   White, Elizabeth A, PA-C  hydrOXYzine (ATARAX/VISTARIL) 25 MG tablet Take 1 tablet (25 mg total) by mouth every 6 (six) hours as needed for anxiety. Patient not taking: Reported on 07/15/2021 12/31/16   Oneta Rack, NP  predniSONE (STERAPRED UNI-PAK 21 TAB) 10 MG (21) TBPK  tablet Take by mouth daily. Take 6 tabs by mouth daily for 2 days, then 5 tabs for 2 days, then 4 tabs for 2 days, then 3 tabs for 2 days, 2 tabs for 2 days, then 1 tab by mouth daily for 2 days 08/01/23   Landis Martins, PA-C  TRINTELLIX 20 MG TABS tablet Take 20 mg by mouth daily. 10/24/19   [provider]    Physical Exam: Vitals:   08/08/23 2245 08/08/23 2330 08/09/23 0000 08/09/23 0110  BP: 130/77  115/78 126/75 131/86  Pulse: (!) 105 96 (!) 101 (!) 109  Resp: 17 (!) 23 (!) 26 20  Temp:    98.5 F (36.9 C)  TempSrc:    Oral  SpO2: 93% 94% 92% 99%  Weight:      Height:        Constitutional: NAD, calm  Eyes: PERTLA, lids and conjunctivae normal ENMT: Mucous membranes are dry. Posterior pharynx clear of any exudate or lesions.   Neck: supple, no masses  Respiratory: no wheezing, no crackles. No accessory muscle use.  Cardiovascular: Rate ~120 and regular. No extremity edema.   Abdomen: No distension, soft, tender in epigastrium and right CVA. Bowel sounds active.  Musculoskeletal: no clubbing / cyanosis. No joint deformity upper and lower extremities.   Skin: no significant rashes, lesions, ulcers. Warm, dry, well-perfused. Neurologic: CN 2-12 grossly intact. Moving all extremities. Alert and oriented.  Psychiatric: Pleasant. Cooperative.    Labs and Imaging on Admission: I have personally reviewed following labs and imaging studies  CBC: Recent Labs  Lab 08/08/23 1735  WBC 19.0*  HGB 11.8*  HCT 34.8*  MCV 84.1  PLT 127*   Basic Metabolic Panel: Recent Labs  Lab 08/08/23 1735  NA 132*  K 3.8  CL 99  CO2 21*  GLUCOSE 139*  BUN 32*  CREATININE 2.25*  CALCIUM 8.5*   GFR: Estimated Creatinine Clearance: 37.6 mL/min (A) (by C-G formula based on SCr of 2.25 mg/dL (H)). Liver Function Tests: Recent Labs  Lab 08/08/23 1735  AST 32  ALT 92*  ALKPHOS 132*  BILITOT 1.7*  PROT 5.9*  ALBUMIN 3.2*   Recent Labs  Lab 08/08/23 1735  LIPASE <10*   No results for input(s): "AMMONIA" in the last 168 hours. Coagulation Profile: No results for input(s): "INR", "PROTIME" in the last 168 hours. Cardiac Enzymes: No results for input(s): "CKTOTAL", "CKMB", "CKMBINDEX", "TROPONINI" in the last 168 hours. BNP (last 3 results) No results for input(s): "PROBNP" in the last 8760 hours. HbA1C: No results for input(s): "HGBA1C" in the last 72 hours. CBG: No results  for input(s): "GLUCAP" in the last 168 hours. Lipid Profile: No results for input(s): "CHOL", "HDL", "LDLCALC", "TRIG", "CHOLHDL", "LDLDIRECT" in the last 72 hours. Thyroid Function Tests: No results for input(s): "TSH", "T4TOTAL", "FREET4", "T3FREE", "THYROIDAB" in the last 72 hours. Anemia Panel: No results for input(s): "VITAMINB12", "FOLATE", "FERRITIN", "TIBC", "IRON", "RETICCTPCT" in the last 72 hours. Urine analysis:    Component Value Date/Time   COLORURINE YELLOW 08/08/2023 1935   APPEARANCEUR HAZY (A) 08/08/2023 1935   LABSPEC 1.015 08/08/2023 1935   PHURINE 6.0 08/08/2023 1935   GLUCOSEU NEGATIVE 08/08/2023 1935   HGBUR MODERATE (A) 08/08/2023 1935   BILIRUBINUR NEGATIVE 08/08/2023 1935   KETONESUR NEGATIVE 08/08/2023 1935   PROTEINUR 100 (A) 08/08/2023 1935   NITRITE NEGATIVE 08/08/2023 1935   LEUKOCYTESUR LARGE (A) 08/08/2023 1935   Sepsis Labs: @LABRCNTIP (procalcitonin:4,lacticidven:4) )No results found for this or any previous  visit (from the past 240 hour(s)).   Radiological Exams on Admission: DG Chest Portable 1 View  Result Date: 08/09/2023 CLINICAL DATA:  Right shoulder pain. EXAM: PORTABLE CHEST 1 VIEW COMPARISON:  November 14, 2019 FINDINGS: The heart size and mediastinal contours are within normal limits. Mildly decreased lung volumes are seen with mild elevation of the right hemidiaphragm. There is no evidence of an acute infiltrate, pleural effusion or pneumothorax. The visualized skeletal structures are unremarkable. IMPRESSION: No active disease. Electronically Signed   By: Aram Candela M.D.   On: 08/09/2023 02:20   CT Renal Stone Study  Result Date: 08/08/2023 CLINICAL DATA:  Right-sided abdominal pain and right-sided back pain. EXAM: CT ABDOMEN AND PELVIS WITHOUT CONTRAST TECHNIQUE: Multidetector CT imaging of the abdomen and pelvis was performed following the standard protocol without IV contrast. RADIATION DOSE REDUCTION: This exam was performed  according to the departmental dose-optimization program which includes automated exposure control, adjustment of the mA and/or kV according to patient size and/or use of iterative reconstruction technique. COMPARISON:  July 15, 2021 FINDINGS: Lower chest: Mild atelectatic changes are seen within the posterior aspect of the left lung base. Hepatobiliary: No focal liver abnormality is seen. The gallbladder is contracted without evidence of gallstones, gallbladder wall thickening, or biliary dilatation. Pancreas: Unremarkable. No pancreatic ductal dilatation or surrounding inflammatory changes. Spleen: Normal in size without focal abnormality. Adrenals/Urinary Tract: Adrenal glands are unremarkable. There is mild asymmetric enlargement of the right kidney without focal lesions, hydronephrosis or obstructing renal calculi. 2 mm and 3 mm nonobstructing renal calculi are seen within the mid and lower right kidney. There is mild right-sided perinephric inflammatory fat stranding. Bladder is unremarkable. Stomach/Bowel: Stomach is within normal limits. The appendix is not clearly identified. No evidence of bowel dilatation. Mild inflammatory fat stranding is seen along the posterolateral aspect of the right lower quadrant and pelvis on the right. Vascular/Lymphatic: No significant vascular findings are present. No enlarged abdominal or pelvic lymph nodes. Reproductive: Uterus and bilateral adnexa are unremarkable. Other: No abdominal wall hernia or abnormality. A very small amount of posterior pelvic free fluid is seen. Musculoskeletal: No acute or significant osseous findings. IMPRESSION: 1. Mild asymmetric enlargement of the right kidney with mild right-sided perinephric inflammatory fat stranding. While this may be secondary to a recently passed renal stone, sequelae associated with acute pyelonephritis cannot be excluded. Correlation with urinalysis is recommended. 2. 2 mm and 3 mm nonobstructing right renal calculi.  3. Mild inflammatory fat stranding along the posterolateral aspect of the right lower quadrant and pelvis on the right. Without visualization of a normal appendix, appendicitis cannot be excluded. Further evaluation with follow-up abdomen pelvis CT following oral and intravenous contrast is recommended if appendicitis is of clinical concern. 4. Very small amount of posterior pelvic free fluid, likely physiologic. Electronically Signed   By: Aram Candela M.D.   On: 08/08/2023 22:44   US Abdomen Limited RUQ (LIVER/GB)  Result Date: 08/08/2023 CLINICAL DATA:  Right upper quadrant pain and back pain. EXAM: ULTRASOUND ABDOMEN LIMITED RIGHT UPPER QUADRANT COMPARISON:  None Available. FINDINGS: Gallbladder: The gallbladder is partially contracted with gallbladder wall thickness of 2.6 mm. No gallstones are visualized. A mild amount of pericholecystic fluid is noted. No sonographic Murphy sign noted by sonographer. Common bile duct: Diameter: 4.7 mm Liver: No focal lesion identified. Within normal limits in parenchymal echogenicity. Portal vein is patent on color Doppler imaging with normal direction of blood flow towards the liver. Other: None. IMPRESSION: 1. Partially  contracted gallbladder with subsequent gallbladder wall thickening and mild pericholecystic fluid. 2. No evidence of cholelithiasis or acute cholecystitis. Electronically Signed   By: Aram Candela M.D.   On: 08/08/2023 22:38     Assessment/Plan   1. Abdominal pain; N/V; SIRS  - Right abdominal/flank pain with N/V, multiple SIRS criteria, and inflammatory changes on CT involving right kidney and RLQ   - Overall, pyelonephritis seems most likely though aspects of the history are suggestive of cholecystitis  - Continue bowel-rest, pain-control, Rocephin and Flagyl, follow cultures and clinical course, follow-up on surgery's assessment and recommendations   2. AKI  - SCr is 2.25, up from 0.73 two years ago  - Likely acute in setting of  the acute illness with N/V  - No obstruction on CT in ED  - Continue IVF hydration, renally-dose medications, repeat chem panel in am    3. Thrombocytopenia  - Mild, possibly from acute infection  - Repeat CBC in am    4. Depression  - Continue Trintellix     DVT prophylaxis: sq heparin   Code Status: Full  Level of Care: Level of care: Med-Surg Family Communication: Mother at bedside  Disposition Plan:  Patient is from: home  Anticipated d/c is to: Home  Anticipated d/c date is: 08/12/23  Patient currently: Pending pain-control, surgery consultation, improved renal function Consults called: Surgery  Admission status: Inpatient     Briscoe Deutscher, MD [facility]  08/09/2023, 2:43 AM

## 2023-08-09 NOTE — Plan of Care (Signed)
  Problem: Clinical Measurements: Goal: Diagnostic test results will improve Outcome: Progressing Goal: Respiratory complications will improve Outcome: Progressing   

## 2023-08-09 NOTE — ED Notes (Signed)
Called by microbiology regarding positive culture, microbiology was called back and advised that pt is currently admitted at [facility] and stated they would contact appropriate staff caring for pt currently.

## 2023-08-09 NOTE — Progress Notes (Signed)
PHARMACY - PHYSICIAN COMMUNICATION CRITICAL VALUE ALERT - BLOOD CULTURE IDENTIFICATION (BCID)  Renee Mcdowell is an 27 y.o. female who presented to [facility] on 08/08/2023 with a chief complaint of 3rd ED visit with  significant R shoulder pain and upper back pain. Now with N/V, elevated WBC, +UA, AKI, abdominal pain, thrombocytopenia.  Assessment:  Urinary vs GI/abdominal source  Name of physician (or Provider) Contacted: Lynden Oxford, MD  Current antibiotics: Rocephin/Flagyl  Changes to prescribed antibiotics recommended:  Patient is on recommended antibiotics - No changes needed  Results for orders placed or performed during the hospital encounter of 08/08/23  Blood Culture ID Panel (Reflexed) (Collected: 08/08/2023  7:34 PM)  Result Value Ref Range   Enterococcus faecalis NOT DETECTED NOT DETECTED   Enterococcus Faecium NOT DETECTED NOT DETECTED   Listeria monocytogenes NOT DETECTED NOT DETECTED   Staphylococcus species NOT DETECTED NOT DETECTED   Staphylococcus aureus (BCID) NOT DETECTED NOT DETECTED   Staphylococcus epidermidis NOT DETECTED NOT DETECTED   Staphylococcus lugdunensis NOT DETECTED NOT DETECTED   Streptococcus species NOT DETECTED NOT DETECTED   Streptococcus agalactiae NOT DETECTED NOT DETECTED   Streptococcus pneumoniae NOT DETECTED NOT DETECTED   Streptococcus pyogenes NOT DETECTED NOT DETECTED   A.calcoaceticus-baumannii NOT DETECTED NOT DETECTED   Bacteroides fragilis NOT DETECTED NOT DETECTED   Enterobacterales DETECTED (A) NOT DETECTED   Enterobacter cloacae complex NOT DETECTED NOT DETECTED   Escherichia coli DETECTED (A) NOT DETECTED   Klebsiella aerogenes NOT DETECTED NOT DETECTED   Klebsiella oxytoca NOT DETECTED NOT DETECTED   Klebsiella pneumoniae NOT DETECTED NOT DETECTED   Proteus species NOT DETECTED NOT DETECTED   Salmonella species NOT DETECTED NOT DETECTED   Serratia marcescens NOT DETECTED NOT DETECTED   Haemophilus influenzae NOT  DETECTED NOT DETECTED   Neisseria meningitidis NOT DETECTED NOT DETECTED   Pseudomonas aeruginosa NOT DETECTED NOT DETECTED   Stenotrophomonas maltophilia NOT DETECTED NOT DETECTED   Candida albicans NOT DETECTED NOT DETECTED   Candida auris NOT DETECTED NOT DETECTED   Candida glabrata NOT DETECTED NOT DETECTED   Candida krusei NOT DETECTED NOT DETECTED   Candida parapsilosis NOT DETECTED NOT DETECTED   Candida tropicalis NOT DETECTED NOT DETECTED   Cryptococcus neoformans/gattii NOT DETECTED NOT DETECTED   CTX-M ESBL NOT DETECTED NOT DETECTED   Carbapenem resistance IMP NOT DETECTED NOT DETECTED   Carbapenem resistance KPC NOT DETECTED NOT DETECTED   Carbapenem resistance NDM NOT DETECTED NOT DETECTED   Carbapenem resist OXA 48 LIKE NOT DETECTED NOT DETECTED   Carbapenem resistance VIM NOT DETECTED NOT DETECTED   Willine Schwalbe S. Merilynn Finland, PharmD, BCPS Clinical Staff Pharmacist [email or web address] Misty Stanley Stillinger 08/09/2023  10:00 AM

## 2023-08-09 NOTE — ED Notes (Addendum)
Pt placed on 2L of oxygen via [place]. O2 sats decreased too low 90s while resting.

## 2023-08-09 NOTE — Consult Note (Signed)
Consult Note  Renee Mcdowell [birth year]  [id number].    Requesting MD: Lynden Oxford, MD Chief Complaint/Reason for Consult: Abdominal pain  HPI:  Patient is a 27 year old female who presented to [facility] yesterday with abdominal pain, flank pain, nausea, vomiting and chills. She had right upper back pain for several weeks and was seen at [facility] 11/25 and treated for muscle sprain (in the setting of increased physical exertion moving apartments) with toradol and decadron and discharged on prednisone and flexeril. She returned to the ED 11/29 and treated again with toradol. Since then pain has transitioned to flank/abdomen with associated n/v and chills. Reported some dysuria a few days ago but none in the last 24 hrs. She has never experienced similar symptoms previously. Pain is exacerbated by movement, deep inspiration and eating. Pain occurs generally about 1 hr after eating.   PMH otherwise significant for depression, anxiety, ADHD. No prior abdominal surgery. Allergy to azithromycin. Not on blood thinners.   ROS: Negative other than HPI  Family History  Problem Relation Age of Onset   Mental illness Father        Schizoaffective disorder   Thyroid disease Mother    Valvular heart disease Mother     Past Medical History:  Diagnosis Date   ADHD (attention deficit hyperactivity disorder)    Anxiety    Depression     History reviewed. No pertinent surgical history.  Social History:  reports that she has quit smoking. Her smoking use included cigarettes. She has never used smokeless tobacco. She reports current alcohol use of about 2.0 standard drinks of alcohol per week. She reports that she does not use drugs.  Allergies:  Allergies  Allergen Reactions   Azithromycin Hives    Medications Prior to Admission  Medication Sig Dispense Refill   amphetamine-dextroamphetamine (ADDERALL XR) 20 MG 24 hr capsule Take 20 mg by mouth daily.     amphetamine-dextroamphetamine  (ADDERALL) 20 MG tablet Take 20 mg by mouth 2 (two) times daily.     atomoxetine (STRATTERA) 100 MG capsule Take 10 mg by mouth daily.     cyclobenzaprine (FLEXERIL) 10 MG tablet Take 1 tablet (10 mg total) by mouth 2 (two) times daily as needed for muscle spasms. 20 tablet 0   hydrOXYzine (ATARAX/VISTARIL) 25 MG tablet Take 1 tablet (25 mg total) by mouth every 6 (six) hours as needed for anxiety. 30 tablet 0   predniSONE (STERAPRED UNI-PAK 21 TAB) 10 MG (21) TBPK tablet Take by mouth daily. Take 6 tabs by mouth daily for 2 days, then 5 tabs for 2 days, then 4 tabs for 2 days, then 3 tabs for 2 days, 2 tabs for 2 days, then 1 tab by mouth daily for 2 days 42 tablet 0   tretinoin (RETIN-A) 0.025 % cream Apply 1 Application topically at bedtime.     triamcinolone cream (KENALOG) 0.5 % Apply 1 Application topically 3 (three) times daily.     TRINTELLIX 20 MG TABS tablet Take 20 mg by mouth daily.      Blood pressure 122/76, pulse (!) 121, temperature 97.8 F (36.6 C), temperature source Oral, resp. rate 20, height 5\' 6"  (1.676 m), weight 79 kg, last menstrual period 07/31/2023, SpO2 92%. Physical Exam:  General: pleasant, WD, female who is laying in bed in NAD HEENT: head is normocephalic, atraumatic.  Sclera are noninjected.  Ears and nose without any masses or lesions.  Mouth is pink and moist Heart: regular,  rate. Lungs:  Respiratory effort nonlabored on room air Abd: soft, ND, TTP epigastrium with voluntary guarding. Tender to deep palpation in RLQ without guarding. No CVA TTP bilaterally. Mild TTP bilateral back consistent with MSK tenderness. +Murphy sign MS: all 4 extremities are symmetrical with no cyanosis, clubbing, or edema. Skin: warm and dry with no masses, lesions, or rashes Neuro: Cranial nerves 2-12 grossly intact, sensation is normal throughout Psych: A&Ox3 with an appropriate affect.   Results for orders placed or performed during the hospital encounter of 08/08/23 (from the  past 48 hour(s))  Lipase, blood     Status: Abnormal   Collection Time: 08/08/23  5:35 PM  Result Value Ref Range   Lipase <10 (L) 11 - 51 U/L    Comment: Performed at [facility], [address] [place], [place] [address]  Comprehensive metabolic panel     Status: Abnormal   Collection Time: 08/08/23  5:35 PM  Result Value Ref Range   Sodium 132 (L) 135 - 145 mmol/L   Potassium 3.8 3.5 - 5.1 mmol/L   Chloride 99 98 - 111 mmol/L   CO2 21 (L) 22 - 32 mmol/L   Glucose, Bld 139 (H) 70 - 99 mg/dL    Comment: Glucose reference range applies only to samples taken after fasting for at least 8 hours.   BUN 32 (H) 6 - 20 mg/dL   Creatinine, Ser 2.95 (H) 0.44 - 1.00 mg/dL   Calcium 8.5 (L) 8.9 - 10.3 mg/dL   Total Protein 5.9 (L) 6.5 - 8.1 g/dL   Albumin 3.2 (L) 3.5 - 5.0 g/dL   AST 32 15 - 41 U/L   ALT 92 (H) 0 - 44 U/L   Alkaline Phosphatase 132 (H) 38 - 126 U/L   Total Bilirubin 1.7 (H) <1.2 mg/dL   GFR, Estimated 30 (L) >60 mL/min    Comment: (NOTE) Calculated using the CKD-EPI Creatinine Equation (2021)    Anion gap 12 5 - 15    Comment: Performed at [facility], [address] [place], [place] [address]  CBC     Status: Abnormal   Collection Time: 08/08/23  5:35 PM  Result Value Ref Range   WBC 19.0 (H) 4.0 - 10.5 K/uL   RBC 4.14 3.87 - 5.11 MIL/uL   Hemoglobin 11.8 (L) 12.0 - 15.0 g/dL   HCT 24.4 (L) 01.0 - 27.2 %   MCV 84.1 80.0 - 100.0 fL   MCH 28.5 26.0 - 34.0 pg   MCHC 33.9 30.0 - 36.0 g/dL   RDW 53.6 64.4 - 03.4 %   Platelets 127 (L) 150 - 400 K/uL   nRBC 0.2 0.0 - 0.2 %    Comment: Performed at [facility], [address] [place], [place] [address]  hCG, quantitative, pregnancy     Status: None   Collection Time: 08/08/23  7:30 PM  Result Value Ref Range   hCG, Beta Chain, Quant, S 1 <5 mIU/mL    Comment:          GEST. AGE      CONC.  (mIU/mL)   <=1 WEEK        5 - 50     2 WEEKS       50 - 500      3 WEEKS       100 - 10,000     4 WEEKS     1,000 - 30,000     5 WEEKS  3,500 - 115,000   6-8 WEEKS     12,000 - 270,000    12 WEEKS     15,000 - 220,000        FEMALE AND NON-PREGNANT FEMALE:     LESS THAN 5 mIU/mL Performed at [facility], [address] [place], [place] [address]   Blood culture (routine x 2)     Status: None (Preliminary result)   Collection Time: 08/08/23  7:34 PM   Specimen: BLOOD  Result Value Ref Range   Specimen Description      BLOOD LEFT ANTECUBITAL Performed at [facility], [address], [place], [place] [address]    Special Requests      BOTTLES DRAWN AEROBIC AND ANAEROBIC Blood Culture adequate volume Performed at [facility], [address] [place], [place] [address]    Culture  Setup Time      GRAM NEGATIVE RODS IN BOTH AEROBIC AND ANAEROBIC BOTTLES CRITICAL VALUE NOTED.  VALUE IS CONSISTENT WITH PREVIOUSLY REPORTED AND CALLED VALUE. Performed at [facility], [address], [place], [place] [address]    Culture GRAM NEGATIVE RODS    Report Status PENDING   Blood culture (routine x 2)     Status: None (Preliminary result)   Collection Time: 08/08/23  7:34 PM   Specimen: BLOOD  Result Value Ref Range   Specimen Description      BLOOD RIGHT ANTECUBITAL Performed at [facility], [address], [place], [place] [address]    Special Requests      BOTTLES DRAWN AEROBIC AND ANAEROBIC Blood Culture adequate volume Performed at [facility], [address] [place], [place] [address]    Culture  Setup Time      GRAM NEGATIVE RODS IN BOTH AEROBIC AND ANAEROBIC BOTTLES CRITICAL RESULT CALLED TO, READ BACK BY AND VERIFIED WITH: C MAS AT [facility] [id number] AT 933 AM BY CM CRITICAL RESULT CALLED TO, READ BACK BY AND VERIFIED WITH: PHARMD A PHAM [id number] AT 939 AM BY CM Performed at [facility], [address] [place], [place]  [address]    Culture GRAM NEGATIVE RODS    Report Status PENDING   Blood Culture ID Panel (Reflexed)     Status: Abnormal   Collection Time: 08/08/23  7:34 PM  Result Value Ref Range   Enterococcus faecalis NOT DETECTED NOT DETECTED   Enterococcus Faecium NOT DETECTED NOT DETECTED   Listeria monocytogenes NOT DETECTED NOT DETECTED   Staphylococcus species NOT DETECTED NOT DETECTED   Staphylococcus aureus (BCID) NOT DETECTED NOT DETECTED   Staphylococcus epidermidis NOT DETECTED NOT DETECTED   Staphylococcus lugdunensis NOT DETECTED NOT DETECTED   Streptococcus species NOT DETECTED NOT DETECTED   Streptococcus agalactiae NOT DETECTED NOT DETECTED   Streptococcus pneumoniae NOT DETECTED NOT DETECTED   Streptococcus pyogenes NOT DETECTED NOT DETECTED   A.calcoaceticus-baumannii NOT DETECTED NOT DETECTED   Bacteroides fragilis NOT DETECTED NOT DETECTED   Enterobacterales DETECTED (A) NOT DETECTED    Comment: Enterobacterales represent a large order of gram negative bacteria, not a single organism. CRITICAL RESULT CALLED TO, READ BACK BY AND VERIFIED WITH: ED AT [facility] C MAS [id number] AT 933 AM BY CM    Enterobacter cloacae complex NOT DETECTED NOT DETECTED   Escherichia coli DETECTED (A) NOT DETECTED    Comment: CRITICAL RESULT CALLED TO, READ BACK BY AND VERIFIED WITH:  ED AT [facility] C MAS [id number] AT 933 AM BY CM  Klebsiella aerogenes NOT DETECTED NOT DETECTED   Klebsiella oxytoca NOT DETECTED NOT DETECTED   Klebsiella pneumoniae NOT DETECTED NOT DETECTED   Proteus species NOT DETECTED NOT DETECTED   Salmonella species NOT DETECTED NOT DETECTED   Serratia marcescens NOT DETECTED NOT DETECTED   Haemophilus influenzae NOT DETECTED NOT DETECTED   Neisseria meningitidis NOT DETECTED NOT DETECTED   Pseudomonas aeruginosa NOT DETECTED NOT DETECTED   Stenotrophomonas maltophilia NOT DETECTED NOT DETECTED   Candida albicans NOT DETECTED NOT DETECTED   Candida auris NOT DETECTED NOT DETECTED    Candida glabrata NOT DETECTED NOT DETECTED   Candida krusei NOT DETECTED NOT DETECTED   Candida parapsilosis NOT DETECTED NOT DETECTED   Candida tropicalis NOT DETECTED NOT DETECTED   Cryptococcus neoformans/gattii NOT DETECTED NOT DETECTED   CTX-M ESBL NOT DETECTED NOT DETECTED   Carbapenem resistance IMP NOT DETECTED NOT DETECTED   Carbapenem resistance KPC NOT DETECTED NOT DETECTED   Carbapenem resistance NDM NOT DETECTED NOT DETECTED   Carbapenem resist OXA 48 LIKE NOT DETECTED NOT DETECTED   Carbapenem resistance VIM NOT DETECTED NOT DETECTED    Comment: Performed at [facility], [address] [place], [place] [address]  Urinalysis, Routine w reflex microscopic -Urine, Clean Catch     Status: Abnormal   Collection Time: 08/08/23  7:35 PM  Result Value Ref Range   Color, Urine YELLOW YELLOW   APPearance HAZY (A) CLEAR   Specific Gravity, Urine 1.015 1.005 - 1.030   pH 6.0 5.0 - 8.0   Glucose, UA NEGATIVE NEGATIVE mg/dL   Hgb urine dipstick MODERATE (A) NEGATIVE   Bilirubin Urine NEGATIVE NEGATIVE   Ketones, ur NEGATIVE NEGATIVE mg/dL   Protein, ur 093 (A) NEGATIVE mg/dL   Nitrite NEGATIVE NEGATIVE   Leukocytes,Ua LARGE (A) NEGATIVE   RBC / HPF 11-20 0 - 5 RBC/hpf   WBC, UA >50 0 - 5 WBC/hpf   Bacteria, UA RARE (A) NONE SEEN   Squamous Epithelial / HPF 0-5 0 - 5 /HPF   Trans Epithel, UA <1     Comment: Performed at [facility], [address] [place], [place] [address]  Pregnancy, urine     Status: None   Collection Time: 08/08/23  7:35 PM  Result Value Ref Range   Preg Test, Ur NEGATIVE NEGATIVE    Comment:        THE SENSITIVITY OF THIS METHODOLOGY IS >25 mIU/mL. Performed at [facility], [address], [place], [place] [address]   Lactic acid, plasma     Status: None   Collection Time: 08/08/23  7:35 PM  Result Value Ref Range   Lactic Acid, Venous 1.4 0.5 - 1.9 mmol/L    Comment: Performed at [facility], [address], [place], [place] [address]  HIV Antibody (routine testing w rflx)     Status: None   Collection Time: 08/09/23  4:44 AM  Result Value Ref Range   HIV Screen 4th Generation wRfx Non Reactive Non Reactive    Comment: Performed at [facility], [address], [place], [place] [address]  Comprehensive metabolic panel     Status: Abnormal   Collection Time: 08/09/23  4:44 AM  Result Value Ref Range   Sodium 132 (L) 135 - 145 mmol/L   Potassium 3.2 (L) 3.5 - 5.1 mmol/L   Chloride 108 98 - 111 mmol/L   CO2 20 (L) 22 - 32 mmol/L   Glucose, Bld 111 (H)  70 - 99 mg/dL    Comment: Glucose reference range applies only to samples taken after fasting for at least 8 hours.   BUN 33 (H) 6 - 20 mg/dL   Creatinine, Ser 1.61 (H) 0.44 - 1.00 mg/dL   Calcium 7.2 (L) 8.9 - 10.3 mg/dL   Total Protein 5.2 (L) 6.5 - 8.1 g/dL   Albumin 2.1 (L) 3.5 - 5.0 g/dL   AST 28 15 - 41 U/L   ALT 70 (H) 0 - 44 U/L   Alkaline Phosphatase 128 (H) 38 - 126 U/L   Total Bilirubin 1.2 (H) <1.2 mg/dL   GFR, Estimated 28 (L) >60 mL/min    Comment: (NOTE) Calculated using the CKD-EPI Creatinine Equation (2021)    Anion gap 4 (L) 5 - 15    Comment: Performed at [facility], [address], [place], [place] [address]  Magnesium     Status: None   Collection Time: 08/09/23  4:44 AM  Result Value Ref Range   Magnesium 2.3 1.7 - 2.4 mg/dL    Comment: Performed at [facility], [address], [place], [place] [address]  Phosphorus     Status: None   Collection Time: 08/09/23  4:44 AM  Result Value Ref Range   Phosphorus 2.5 2.5 - 4.6 mg/dL    Comment: Performed at [facility], [address], [place], [place] [address]  CBC     Status: Abnormal   Collection Time: 08/09/23  4:44 AM  Result Value Ref Range   WBC 15.6 (H) 4.0 - 10.5 K/uL   RBC 3.72 (L) 3.87 - 5.11 MIL/uL   Hemoglobin 10.8 (L) 12.0 - 15.0 g/dL   HCT 78.2 (L) 95.6 -  46.0 %   MCV 89.0 80.0 - 100.0 fL   MCH 29.0 26.0 - 34.0 pg   MCHC 32.6 30.0 - 36.0 g/dL   RDW 21.3 08.6 - 57.8 %   Platelets 98 (L) 150 - 400 K/uL    Comment: Immature Platelet Fraction may be clinically indicated, consider ordering this additional test [id number]    nRBC 0.0 0.0 - 0.2 %    Comment: Performed at [facility] [address] [place], [place] [address]   DG Chest Portable 1 View  Result Date: 08/09/2023 CLINICAL DATA:  Right shoulder pain. EXAM: PORTABLE CHEST 1 VIEW COMPARISON:  November 14, 2019 FINDINGS: The heart size and mediastinal contours are within normal limits. Mildly decreased lung volumes are seen with mild elevation of the right hemidiaphragm. There is no evidence of an acute infiltrate, pleural effusion or pneumothorax. The visualized skeletal structures are unremarkable. IMPRESSION: No active disease. Electronically Signed   By: Aram Candela M.D.   On: 08/09/2023 02:20   CT Renal Stone Study  Result Date: 08/08/2023 CLINICAL DATA:  Right-sided abdominal pain and right-sided back pain. EXAM: CT ABDOMEN AND PELVIS WITHOUT CONTRAST TECHNIQUE: Multidetector CT imaging of the abdomen and pelvis was performed following the standard protocol without IV contrast. RADIATION DOSE REDUCTION: This exam was performed according to the departmental dose-optimization program which includes automated exposure control, adjustment of the mA and/or kV according to patient size and/or use of iterative reconstruction technique. COMPARISON:  July 15, 2021 FINDINGS: Lower chest: Mild atelectatic changes are seen within the posterior aspect of the left lung base. Hepatobiliary: No focal liver abnormality is seen. The gallbladder is contracted without evidence of gallstones, gallbladder wall thickening, or biliary dilatation. Pancreas: Unremarkable. No pancreatic ductal  dilatation or surrounding inflammatory changes. Spleen: Normal in size without focal abnormality.  Adrenals/Urinary Tract: Adrenal glands are unremarkable. There is mild asymmetric enlargement of the right kidney without focal lesions, hydronephrosis or obstructing renal calculi. 2 mm and 3 mm nonobstructing renal calculi are seen within the mid and lower right kidney. There is mild right-sided perinephric inflammatory fat stranding. Bladder is unremarkable. Stomach/Bowel: Stomach is within normal limits. The appendix is not clearly identified. No evidence of bowel dilatation. Mild inflammatory fat stranding is seen along the posterolateral aspect of the right lower quadrant and pelvis on the right. Vascular/Lymphatic: No significant vascular findings are present. No enlarged abdominal or pelvic lymph nodes. Reproductive: Uterus and bilateral adnexa are unremarkable. Other: No abdominal wall hernia or abnormality. A very small amount of posterior pelvic free fluid is seen. Musculoskeletal: No acute or significant osseous findings. IMPRESSION: 1. Mild asymmetric enlargement of the right kidney with mild right-sided perinephric inflammatory fat stranding. While this may be secondary to a recently passed renal stone, sequelae associated with acute pyelonephritis cannot be excluded. Correlation with urinalysis is recommended. 2. 2 mm and 3 mm nonobstructing right renal calculi. 3. Mild inflammatory fat stranding along the posterolateral aspect of the right lower quadrant and pelvis on the right. Without visualization of a normal appendix, appendicitis cannot be excluded. Further evaluation with follow-up abdomen pelvis CT following oral and intravenous contrast is recommended if appendicitis is of clinical concern. 4. Very small amount of posterior pelvic free fluid, likely physiologic. Electronically Signed   By: Aram Candela M.D.   On: 08/08/2023 22:44   US Abdomen Limited RUQ (LIVER/GB)  Result Date: 08/08/2023 CLINICAL DATA:  Right upper quadrant pain and back pain. EXAM: ULTRASOUND ABDOMEN LIMITED RIGHT  UPPER QUADRANT COMPARISON:  None Available. FINDINGS: Gallbladder: The gallbladder is partially contracted with gallbladder wall thickness of 2.6 mm. No gallstones are visualized. A mild amount of pericholecystic fluid is noted. No sonographic Murphy sign noted by sonographer. Common bile duct: Diameter: 4.7 mm Liver: No focal lesion identified. Within normal limits in parenchymal echogenicity. Portal vein is patent on color Doppler imaging with normal direction of blood flow towards the liver. Other: None. IMPRESSION: 1. Partially contracted gallbladder with subsequent gallbladder wall thickening and mild pericholecystic fluid. 2. No evidence of cholelithiasis or acute cholecystitis. Electronically Signed   By: Aram Candela M.D.   On: 08/08/2023 22:38      Assessment/Plan E.coli and enterobacter bacteremia - on blood Cx yesterday, WBC 15K from 19K, afebrile, abx per primary  Pyelonephritis - UA with +leukocytes, UCx pening, CT renal with right renal enlargement and perinephric stranding Abdominal pain  - RUQ [place] with no gallstones, no sonographic murphy sign, mild pericholecystic fluid and contracted gallbladder - CT renal study was not able to visualize appendix - WBC as above, HD stable - LFTs mildly elevated - she is having some symptoms consistent with cholecystitis though clinical picture is not clear if these are due to concomitant pyelo. Recommend HIDA for further evaluation.   In regard to RLQ inflammation on renal CT and question of appendicitis she is TTP in RLQ but in the setting of intraabdominal inflammation. Do not think she needs repeat imaging urgently as she is on appropriate abx should appendix be a concern. Follow labs and exam and consider higher yield CT with contrast once aki improved   FEN:NPO for HIDA VTE: ok to have LMWH or SQH from surgery standpoint ID: [email or web address]   I reviewed ED provider notes, hospitalist notes,  last 24 h vitals and pain scores, last 48 h  intake and output, last 24 h labs and trends, and last 24 h imaging results.   Carl Best, [facility] 08/09/2023, 2:50 PM Please see Amion for pager number during day hours 7:00am-4:30pm

## 2023-08-09 NOTE — Progress Notes (Signed)
   08/09/23 0530  Assess: MEWS Score  Temp 97.8 F (36.6 C)  BP 122/76  MAP (mmHg) 91  Pulse Rate (!) 121  Resp 20  SpO2 92 %  O2 Device Room Air  Assess: MEWS Score  MEWS Temp 0  MEWS Systolic 0  MEWS Pulse 2  MEWS RR 0  MEWS LOC 0  MEWS Score 2  MEWS Score Color Yellow  Assess: if the MEWS score is Yellow or Red  Were vital signs accurate and taken at a resting state? Yes  Does the patient meet 2 or more of the SIRS criteria? Yes  Does the patient have a confirmed or suspected source of infection? No  MEWS guidelines implemented  Yes, yellow  Treat  MEWS Interventions Considered administering scheduled or prn medications/treatments as ordered  Take Vital Signs  Increase Vital Sign Frequency  Yellow: Q2hr x1, continue Q4hrs until patient remains green for 12hrs  Escalate  MEWS: Escalate Yellow: Discuss with charge nurse and consider notifying provider and/or RRT  Notify: Charge Nurse/RN  Name of Charge Nurse/RN Notified Hilliard Clark RN  Provider Notification  Provider Name/Title Chinita Greenland, NP  Date Provider Notified 08/09/23  Time Provider Notified 0600  Method of Notification Page  Notification Reason Change in status  Provider response No new orders  Date of Provider Response 08/09/23  Time of Provider Response 0610  Assess: SIRS CRITERIA  SIRS Temperature  0  SIRS Pulse 1  SIRS Respirations  0  SIRS WBC 0  SIRS Score Sum  1

## 2023-08-10 ENCOUNTER — Encounter (HOSPITAL_COMMUNITY): Payer: Self-pay | Admitting: Family Medicine

## 2023-08-10 ENCOUNTER — Inpatient Hospital Stay (HOSPITAL_COMMUNITY): Payer: BC Managed Care – PPO

## 2023-08-10 DIAGNOSIS — N12 Tubulo-interstitial nephritis, not specified as acute or chronic: Secondary | ICD-10-CM | POA: Diagnosis not present

## 2023-08-10 LAB — COMPREHENSIVE METABOLIC PANEL
ALT: 48 U/L — ABNORMAL HIGH (ref 0–44)
AST: 20 U/L (ref 15–41)
Albumin: 1.9 g/dL — ABNORMAL LOW (ref 3.5–5.0)
Alkaline Phosphatase: 100 U/L (ref 38–126)
Anion gap: 11 (ref 5–15)
BUN: 41 mg/dL — ABNORMAL HIGH (ref 6–20)
CO2: 16 mmol/L — ABNORMAL LOW (ref 22–32)
Calcium: 7.6 mg/dL — ABNORMAL LOW (ref 8.9–10.3)
Chloride: 108 mmol/L (ref 98–111)
Creatinine, Ser: 2.82 mg/dL — ABNORMAL HIGH (ref 0.44–1.00)
GFR, Estimated: 23 mL/min — ABNORMAL LOW (ref >60)
Glucose, Bld: 59 mg/dL — ABNORMAL LOW (ref 70–99)
Potassium: 3.8 mmol/L (ref 3.5–5.1)
Sodium: 135 mmol/L (ref 135–145)
Total Bilirubin: 1.4 mg/dL — ABNORMAL HIGH (ref <1.2)
Total Protein: 4.9 g/dL — ABNORMAL LOW (ref 6.5–8.1)

## 2023-08-10 LAB — CBC WITH DIFFERENTIAL/PLATELET
Abs Immature Granulocytes: 0.23 10*3/uL — ABNORMAL HIGH (ref 0.00–0.07)
Basophils Absolute: 0 10*3/uL (ref 0.0–0.1)
Basophils Relative: 0 %
Eosinophils Absolute: 0.2 10*3/uL (ref 0.0–0.5)
Eosinophils Relative: 1 %
HCT: 30.5 % — ABNORMAL LOW (ref 36.0–46.0)
Hemoglobin: 10.3 g/dL — ABNORMAL LOW (ref 12.0–15.0)
Immature Granulocytes: 1 %
Lymphocytes Relative: 5 %
Lymphs Abs: 0.9 10*3/uL (ref 0.7–4.0)
MCH: 29.6 pg (ref 26.0–34.0)
MCHC: 33.8 g/dL (ref 30.0–36.0)
MCV: 87.6 fL (ref 80.0–100.0)
Monocytes Absolute: 0.6 10*3/uL (ref 0.1–1.0)
Monocytes Relative: 4 %
Neutro Abs: 15.1 10*3/uL — ABNORMAL HIGH (ref 1.7–7.7)
Neutrophils Relative %: 89 %
Platelets: 80 10*3/uL — ABNORMAL LOW (ref 150–400)
RBC: 3.48 MIL/uL — ABNORMAL LOW (ref 3.87–5.11)
RDW: 15.5 % (ref 11.5–15.5)
WBC: 17.1 10*3/uL — ABNORMAL HIGH (ref 4.0–10.5)
nRBC: 0 % (ref 0.0–0.2)

## 2023-08-10 LAB — URINE CULTURE: Culture: <10000 — AB

## 2023-08-10 LAB — D-DIMER, QUANTITATIVE: D-Dimer, Quant: 8.57 ug{FEU}/mL — ABNORMAL HIGH (ref 0.00–0.50)

## 2023-08-10 LAB — MAGNESIUM: Magnesium: 2.4 mg/dL (ref 1.7–2.4)

## 2023-08-10 MED ORDER — TECHNETIUM TC 99M MEBROFENIN IV KIT
5.4300 | PACK | Freq: Once | INTRAVENOUS | Status: AC
  Administered 2023-08-10: 5.43 via INTRAVENOUS

## 2023-08-10 MED ORDER — METHOCARBAMOL 1000 MG/10ML IJ SOLN
500.0000 mg | Freq: Once | INTRAMUSCULAR | Status: AC
  Administered 2023-08-10: 500 mg via INTRAVENOUS
  Filled 2023-08-10: qty 10

## 2023-08-10 NOTE — Plan of Care (Signed)
  Problem: Education: Goal: Knowledge of General Education information will improve Description: Including pain rating scale, medication(s)/side effects and non-pharmacologic comfort measures Outcome: Progressing   Problem: Health Behavior/Discharge Planning: Goal: Ability to manage health-related needs will improve Outcome: Progressing   Problem: Activity: Goal: Risk for activity intolerance will decrease Outcome: Progressing   Problem: Pain Management: Goal: General experience of comfort will improve Outcome: Progressing   Problem: Fluid Volume: Goal: Hemodynamic stability will improve Outcome: Progressing

## 2023-08-10 NOTE — Progress Notes (Signed)
Progress Note     Subjective: Significant pain overnight due to holding pain meds for HIDA scan. Better now that she has been able to take some medications. Pain is mostly in right flank but also present in RUQ and epigastrium. No n/v but has only sipped on water with meds  Mother is at bedside  Objective: Vital signs in last 24 hours: Temp:  [97.6 F (36.4 C)-99 F (37.2 C)] 98.6 F (37 C) (12/04 0635) Pulse Rate:  [92-108] 108 (12/04 0635) Resp:  [16-18] 18 (12/04 0635) BP: (118-143)/(73-94) 143/92 (12/04 0635) SpO2:  [90 %-100 %] 100 % (12/04 0635) Last BM Date : 08/08/23  Intake/Output from previous day: 12/03 0701 - 12/04 0700 In: 3835.3 [P.O.:130; I.V.:2759; IV Piggyback:946.3] Out: 1350 [Urine:1350] Intake/Output this shift: No intake/output data recorded.  PE: General: pleasant, WD, female who is laying in bed in NAD Lungs: Respiratory effort nonlabored Abd: soft, ND, mild to moderate TTP RUQ and R flank. Moderate TTP epigastrium. Mild TTP RLQ MSK: all 4 extremities are symmetrical with no cyanosis, clubbing, or edema. Skin: warm and dry  Psych: A&Ox3 with an appropriate affect.    Lab Results:  Recent Labs    08/09/23 0444 08/10/23 0720  WBC 15.6* 17.1*  HGB 10.8* 10.3*  HCT 33.1* 30.5*  PLT 98* 80*   BMET Recent Labs    08/09/23 2027 08/10/23 0720  NA 132* 135  K 4.0 3.8  CL 105 108  CO2 15* 16*  GLUCOSE 67* 59*  BUN 34* 41*  CREATININE 2.68* 2.82*  CALCIUM 7.7* 7.6*   PT/INR No results for input(s): "LABPROT", "INR" in the last 72 hours. CMP     Component Value Date/Time   NA 135 08/10/2023 0720   K 3.8 08/10/2023 0720   CL 108 08/10/2023 0720   CO2 16 (L) 08/10/2023 0720   GLUCOSE 59 (L) 08/10/2023 0720   BUN 41 (H) 08/10/2023 0720   CREATININE 2.82 (H) 08/10/2023 0720   CALCIUM 7.6 (L) 08/10/2023 0720   PROT 4.9 (L) 08/10/2023 0720   ALBUMIN 1.9 (L) 08/10/2023 0720   AST 20 08/10/2023 0720   ALT 48 (H) 08/10/2023 0720    ALKPHOS 100 08/10/2023 0720   BILITOT 1.4 (H) 08/10/2023 0720   GFRNONAA 23 (L) 08/10/2023 0720   GFRAA >60 11/14/2019 0306   Lipase     Component Value Date/Time   LIPASE <10 (L) 08/08/2023 1735       Studies/Results: DG Chest Portable 1 View  Result Date: 08/09/2023 CLINICAL DATA:  Right shoulder pain. EXAM: PORTABLE CHEST 1 VIEW COMPARISON:  November 14, 2019 FINDINGS: The heart size and mediastinal contours are within normal limits. Mildly decreased lung volumes are seen with mild elevation of the right hemidiaphragm. There is no evidence of an acute infiltrate, pleural effusion or pneumothorax. The visualized skeletal structures are unremarkable. IMPRESSION: No active disease. Electronically Signed   By: Aram Candela M.D.   On: 08/09/2023 02:20   CT Renal Stone Study  Result Date: 08/08/2023 CLINICAL DATA:  Right-sided abdominal pain and right-sided back pain. EXAM: CT ABDOMEN AND PELVIS WITHOUT CONTRAST TECHNIQUE: Multidetector CT imaging of the abdomen and pelvis was performed following the standard protocol without IV contrast. RADIATION DOSE REDUCTION: This exam was performed according to the departmental dose-optimization program which includes automated exposure control, adjustment of the mA and/or kV according to patient size and/or use of iterative reconstruction technique. COMPARISON:  July 15, 2021 FINDINGS: Lower chest: Mild atelectatic changes are  seen within the posterior aspect of the left lung base. Hepatobiliary: No focal liver abnormality is seen. The gallbladder is contracted without evidence of gallstones, gallbladder wall thickening, or biliary dilatation. Pancreas: Unremarkable. No pancreatic ductal dilatation or surrounding inflammatory changes. Spleen: Normal in size without focal abnormality. Adrenals/Urinary Tract: Adrenal glands are unremarkable. There is mild asymmetric enlargement of the right kidney without focal lesions, hydronephrosis or obstructing renal  calculi. 2 mm and 3 mm nonobstructing renal calculi are seen within the mid and lower right kidney. There is mild right-sided perinephric inflammatory fat stranding. Bladder is unremarkable. Stomach/Bowel: Stomach is within normal limits. The appendix is not clearly identified. No evidence of bowel dilatation. Mild inflammatory fat stranding is seen along the posterolateral aspect of the right lower quadrant and pelvis on the right. Vascular/Lymphatic: No significant vascular findings are present. No enlarged abdominal or pelvic lymph nodes. Reproductive: Uterus and bilateral adnexa are unremarkable. Other: No abdominal wall hernia or abnormality. A very small amount of posterior pelvic free fluid is seen. Musculoskeletal: No acute or significant osseous findings. IMPRESSION: 1. Mild asymmetric enlargement of the right kidney with mild right-sided perinephric inflammatory fat stranding. While this may be secondary to a recently passed renal stone, sequelae associated with acute pyelonephritis cannot be excluded. Correlation with urinalysis is recommended. 2. 2 mm and 3 mm nonobstructing right renal calculi. 3. Mild inflammatory fat stranding along the posterolateral aspect of the right lower quadrant and pelvis on the right. Without visualization of a normal appendix, appendicitis cannot be excluded. Further evaluation with follow-up abdomen pelvis CT following oral and intravenous contrast is recommended if appendicitis is of clinical concern. 4. Very small amount of posterior pelvic free fluid, likely physiologic. Electronically Signed   By: Aram Candela M.D.   On: 08/08/2023 22:44   US Abdomen Limited RUQ (LIVER/GB)  Result Date: 08/08/2023 CLINICAL DATA:  Right upper quadrant pain and back pain. EXAM: ULTRASOUND ABDOMEN LIMITED RIGHT UPPER QUADRANT COMPARISON:  None Available. FINDINGS: Gallbladder: The gallbladder is partially contracted with gallbladder wall thickness of 2.6 mm. No gallstones are  visualized. A mild amount of pericholecystic fluid is noted. No sonographic Murphy sign noted by sonographer. Common bile duct: Diameter: 4.7 mm Liver: No focal lesion identified. Within normal limits in parenchymal echogenicity. Portal vein is patent on color Doppler imaging with normal direction of blood flow towards the liver. Other: None. IMPRESSION: 1. Partially contracted gallbladder with subsequent gallbladder wall thickening and mild pericholecystic fluid. 2. No evidence of cholelithiasis or acute cholecystitis. Electronically Signed   By: Aram Candela M.D.   On: 08/08/2023 22:38    Anti-infectives: Anti-infectives (From admission, onward)    Start     Dose/Rate Route Frequency Ordered Stop   08/09/23 2000  cefTRIAXone (ROCEPHIN) 2 g in sodium chloride 0.9 % 100 mL IVPB        2 g 200 mL/hr over 30 Minutes Intravenous Every 24 hours 08/09/23 0145 08/16/23 1959   08/09/23 1100  metroNIDAZOLE (FLAGYL) IVPB 500 mg        500 mg 100 mL/hr over 60 Minutes Intravenous Every 12 hours 08/09/23 0252     08/08/23 2300  metroNIDAZOLE (FLAGYL) IVPB 500 mg        500 mg 100 mL/hr over 60 Minutes Intravenous  Once 08/08/23 2256 08/09/23 0024   08/08/23 1915  cefTRIAXone (ROCEPHIN) 2 g in sodium chloride 0.9 % 100 mL IVPB        2 g 200 mL/hr over 30 Minutes Intravenous  Once 08/08/23 1903 08/08/23 2025        Assessment/Plan E.coli and enterobacter bacteremia - on blood Cx yesterday, WBC 17.1K>15K >19K, afebrile, abx per primary  Pyelonephritis - UA with +leukocytes, UCx pending, CT renal with right renal enlargement and perinephric stranding Abdominal pain  - RUQ [place] with no gallstones, no sonographic murphy sign, mild pericholecystic fluid and contracted gallbladder - CT renal study was not able to visualize appendix - WBC as above, HD stable - LFTs mildly elevated - HIDA negative. Think upper abdominal pain is secondary to inflammation from pyelo  - can have diet from surgical  standpoint   In regard to RLQ inflammation on renal CT and question of appendicitis she is TTP in RLQ but in the setting of intraabdominal inflammation. Do not think she needs repeat imaging urgently as she is on appropriate abx should appendix be a concern. Follow labs and exam and consider higher yield CT with contrast once aki improved     FEN: CLD VTE: ok to have LMWH or SQH from surgery standpoint ID: [email or web address]     I reviewed hospitalist notes, last 24 h vitals and pain scores, last 48 h intake and output, last 24 h labs and trends, and last 24 h imaging results.    LOS: 1 day   Eric Form, [facility] 08/10/2023, 10:09 AM Please see Amion for pager number during day hours 7:00am-4:30pm

## 2023-08-10 NOTE — Progress Notes (Signed)
   08/10/23 1616  TOC Brief Assessment  Insurance and Status Reviewed  Patient has primary care physician Yes  Home environment has been reviewed Private  Prior level of function: Independent  Social Determinants of Health Reivew SDOH reviewed no interventions necessary  Readmission risk has been reviewed Yes  Transition of care needs no transition of care needs at this time

## 2023-08-10 NOTE — Progress Notes (Signed)
PROGRESS NOTE    Renee Mcdowell  QVZ:[id number] DOB: 23-Nov-1995 DOA: 08/08/2023 PCP: [name], MD   Brief Narrative:  PMH of ADHD, anxiety, depression presented to the hospital with complaints of right-sided back pain, nausea and vomiting. 11/25 seen in the ED with complaints of right sided mid back pain..  Thought to be trapezius muscle straining secondary to overexertion.  (She is currently in the process of moving).  Was discharged with Flexeril and prednisone. 11/29 seen again for the same pain now worsening.  Discharged back home. After going home started to have multiple vomiting episode with worsening pain. 12/2 present again to ED this time with pain involving right upper abdomen area especially after eating.  Was actually seen by PCP who ordered her to go to ED and recommended CT abdomen. Currently being treated for E. coli sepsis, AKI, suspected etiology is pyonephritis versus cholecystitis.  General surgery following.  Assessment & Plan:   Principal Problem:   Pyelonephritis of right kidney Active Problems:   Depression with anxiety   AKI (acute kidney injury) (HCC)   Thrombocytopenia (HCC)  Severe sepsis secondary to right-sided pyelonephritis and E. coli bacteremia: She states that her pain initially started at right shoulder, then periumbilical which radiated to the right lower quadrant and then right flank.  No significant CVA tenderness on my examination.  UA consistent with UTI and CT renal stone study suggesting possible acute pyelonephritis and small 2/3 mm nonobstructing right renal calculi.  Patient was started on Rocephin, blood culture now growing E. coli final sensitivities pending, urine culture is still in process.  Patient is still with pain and leukocytosis which is gradually improving.  Her sepsis parameters are improving.  She is afebrile.  Previous hospitalist suspected possible acute cholecystitis however HIDA completed today is negative for acute  cholecystitis.  CT renal study concerning for acute cholecystitis as well as acute appendicitis, general surgery does not think she has either acute cholecystitis or acute appendicitis.  Will continue current antibiotics and await final culture reports.  Will start her on regular diet.  Acute kidney injury. Baseline serum creatinine normal 0.7 based on the labs available 2 years ago however she presented with creatinine of 2.25 which is worse today at 2.82 despite of IV fluids.  Will continue fluids and repeat labs in the morning.  If creatinine worsens, may repeat CT renal to check for obstruction.   Elevated LFT. Bilirubin 1.7, AST normal, ALT mildly elevated. Currently parameters are improving.  Monitor values daily.   Acute thrombocytopenia. Platelet count at baseline around 300. Presentation platelet count 127.  Now dropping down to 80, likely due to acute sepsis.  Monitor closely.  No signs of bleeding.  Anemia-dilutional. Hemoglobin stable.   Hypoxia.  Saturating 88% on room air. Likely atelectasis in the setting of inability to take deep breaths secondary to pain in the right upper quadrant area.  She is now complaining of pleuritic chest pain with deep breathing.  Will check D-dimer, if elevated, will proceed with CTA chest.  Depression. Will continue home regimen.   ADHD. Medications are currently on hold.  DVT prophylaxis: Place and maintain sequential compression device Start: 08/09/23 0850   Code Status: Full Code  Family Communication: Mother present at bedside.  Plan of care discussed with patient in length and he/she verbalized understanding and agreed with it.  Status is: Inpatient Remains inpatient appropriate because: Still with pain and elevated creatinine.   Estimated body mass index is 28.11 kg/m as  calculated from the following:   Height as of this encounter: 5\' 6"  (1.676 m).   Weight as of this encounter: 79 kg.    Nutritional Assessment: Body mass  index is 28.11 kg/m.Marland Kitchen Seen by dietician.  I agree with the assessment and plan as outlined below: Nutrition Status:        . Skin Assessment: I have examined the patient's skin and I agree with the wound assessment as performed by the wound care RN as outlined below:    Consultants:  General surgery  Procedures:  As above  Antimicrobials:  Anti-infectives (From admission, onward)    Start     Dose/Rate Route Frequency Ordered Stop   08/09/23 2000  cefTRIAXone (ROCEPHIN) 2 g in sodium chloride 0.9 % 100 mL IVPB        2 g 200 mL/hr over 30 Minutes Intravenous Every 24 hours 08/09/23 0145 08/16/23 1959   08/09/23 1100  metroNIDAZOLE (FLAGYL) IVPB 500 mg        500 mg 100 mL/hr over 60 Minutes Intravenous Every 12 hours 08/09/23 0252     08/08/23 2300  metroNIDAZOLE (FLAGYL) IVPB 500 mg        500 mg 100 mL/hr over 60 Minutes Intravenous  Once 08/08/23 2256 08/09/23 0024   08/08/23 1915  cefTRIAXone (ROCEPHIN) 2 g in sodium chloride 0.9 % 100 mL IVPB        2 g 200 mL/hr over 30 Minutes Intravenous  Once 08/08/23 1903 08/08/23 2025         Subjective: Patient seen and examined.  She still complains of pain in the right flank, periumbilical and right lower quadrant area but pain is improving.  She has no other complaint.  Wants to eat.  Objective: Vitals:   08/09/23 2321 08/10/23 0234 08/10/23 0635 08/10/23 1042  BP: 131/78 123/85 (!) 143/92 131/78  Pulse: (!) 104 92 (!) 108 86  Resp: 18 18 18 18   Temp: 99 F (37.2 C) 98.2 F (36.8 C) 98.6 F (37 C) 98.4 F (36.9 C)  TempSrc: Oral Oral Oral Oral  SpO2: 90% 96% 100% 99%  Weight:      Height:        Intake/Output Summary (Last 24 hours) at 08/10/2023 1255 Last data filed at 08/10/2023 1043 Gross per 24 hour  Intake 3835.31 ml  Output 1750 ml  Net 2085.31 ml   Filed Weights   08/08/23 1731 08/09/23 0500  Weight: 72.6 kg 79 kg    Examination:  General exam: Appears calm and comfortable  Respiratory  system: Clear to auscultation. Respiratory effort normal. Cardiovascular system: S1 & S2 heard, RRR. No JVD, murmurs, rubs, gallops or clicks. No pedal edema. Gastrointestinal system: Abdomen is nondistended, soft and tender at epigastric, periumbilical and right upper quadrant area. No organomegaly or masses felt. Normal bowel sounds heard. Central nervous system: Alert and oriented. No focal neurological deficits. Extremities: Symmetric 5 x 5 power. Skin: No rashes, lesions or ulcers Psychiatry: Judgement and insight appear normal. Mood & affect appropriate.    Data Reviewed: I have personally reviewed following labs and imaging studies  CBC: Recent Labs  Lab 08/08/23 1735 08/09/23 0444 08/10/23 0720  WBC 19.0* 15.6* 17.1*  NEUTROABS  --   --  15.1*  HGB 11.8* 10.8* 10.3*  HCT 34.8* 33.1* 30.5*  MCV 84.1 89.0 87.6  PLT 127* 98* 80*   Basic Metabolic Panel: Recent Labs  Lab 08/08/23 1735 08/09/23 0444 08/09/23 2027 08/10/23 0720  NA 132*  132* 132* 135  K 3.8 3.2* 4.0 3.8  CL 99 108 105 108  CO2 21* 20* 15* 16*  GLUCOSE 139* 111* 67* 59*  BUN 32* 33* 34* 41*  CREATININE 2.25* 2.33* 2.68* 2.82*  CALCIUM 8.5* 7.2* 7.7* 7.6*  MG  --  2.3  --  2.4  PHOS  --  2.5  --   --    GFR: Estimated Creatinine Clearance: 31.2 mL/min (A) (by C-G formula based on SCr of 2.82 mg/dL (H)). Liver Function Tests: Recent Labs  Lab 08/08/23 1735 08/09/23 0444 08/10/23 0720  AST 32 28 20  ALT 92* 70* 48*  ALKPHOS 132* 128* 100  BILITOT 1.7* 1.2* 1.4*  PROT 5.9* 5.2* 4.9*  ALBUMIN 3.2* 2.1* 1.9*   Recent Labs  Lab 08/08/23 1735  LIPASE <10*   No results for input(s): "AMMONIA" in the last 168 hours. Coagulation Profile: No results for input(s): "INR", "PROTIME" in the last 168 hours. Cardiac Enzymes: No results for input(s): "CKTOTAL", "CKMB", "CKMBINDEX", "TROPONINI" in the last 168 hours. BNP (last 3 results) No results for input(s): "PROBNP" in the last 8760  hours. HbA1C: No results for input(s): "HGBA1C" in the last 72 hours. CBG: No results for input(s): "GLUCAP" in the last 168 hours. Lipid Profile: No results for input(s): "CHOL", "HDL", "LDLCALC", "TRIG", "CHOLHDL", "LDLDIRECT" in the last 72 hours. Thyroid Function Tests: No results for input(s): "TSH", "T4TOTAL", "FREET4", "T3FREE", "THYROIDAB" in the last 72 hours. Anemia Panel: No results for input(s): "VITAMINB12", "FOLATE", "FERRITIN", "TIBC", "IRON", "RETICCTPCT" in the last 72 hours. Sepsis Labs: Recent Labs  Lab 08/08/23 1935  LATICACIDVEN 1.4    Recent Results (from the past 240 hour(s))  Blood culture (routine x 2)     Status: None (Preliminary result)   Collection Time: 08/08/23  7:34 PM   Specimen: BLOOD  Result Value Ref Range Status   Specimen Description   Final    BLOOD LEFT ANTECUBITAL Performed at [facility], [address], [place], [place] [address]    Special Requests   Final    BOTTLES DRAWN AEROBIC AND ANAEROBIC Blood Culture adequate volume Performed at [facility], [address] [place], [place] [address]    Culture  Setup Time   Final    GRAM NEGATIVE RODS IN BOTH AEROBIC AND ANAEROBIC BOTTLES CRITICAL VALUE NOTED.  VALUE IS CONSISTENT WITH PREVIOUSLY REPORTED AND CALLED VALUE. Performed at [facility], [address] [place], [place] [address]    Culture GRAM NEGATIVE RODS  Final   Report Status PENDING  Incomplete  Blood culture (routine x 2)     Status: Abnormal (Preliminary result)   Collection Time: 08/08/23  7:34 PM   Specimen: BLOOD  Result Value Ref Range Status   Specimen Description   Final    BLOOD RIGHT ANTECUBITAL Performed at [facility], [address], [place], [place] [address]    Special Requests   Final    BOTTLES DRAWN AEROBIC AND ANAEROBIC Blood Culture adequate volume Performed at [facility], [address]  [place], [place] [address]    Culture  Setup Time   Final    GRAM NEGATIVE RODS IN BOTH AEROBIC AND ANAEROBIC BOTTLES CRITICAL RESULT CALLED TO, READ BACK BY AND VERIFIED WITH: C MAS AT [facility] [id number] AT 933 AM BY CM CRITICAL RESULT CALLED TO, READ BACK BY AND VERIFIED WITH: PHARMD A PHAM [id number] AT 939 AM BY CM    Culture (A)  Final  ESCHERICHIA COLI SUSCEPTIBILITIES TO FOLLOW Performed at [facility], [address] [place], [place] [address]    Report Status PENDING  Incomplete  Blood Culture ID Panel (Reflexed)     Status: Abnormal   Collection Time: 08/08/23  7:34 PM  Result Value Ref Range Status   Enterococcus faecalis NOT DETECTED NOT DETECTED Final   Enterococcus Faecium NOT DETECTED NOT DETECTED Final   Listeria monocytogenes NOT DETECTED NOT DETECTED Final   Staphylococcus species NOT DETECTED NOT DETECTED Final   Staphylococcus aureus (BCID) NOT DETECTED NOT DETECTED Final   Staphylococcus epidermidis NOT DETECTED NOT DETECTED Final   Staphylococcus lugdunensis NOT DETECTED NOT DETECTED Final   Streptococcus species NOT DETECTED NOT DETECTED Final   Streptococcus agalactiae NOT DETECTED NOT DETECTED Final   Streptococcus pneumoniae NOT DETECTED NOT DETECTED Final   Streptococcus pyogenes NOT DETECTED NOT DETECTED Final   A.calcoaceticus-baumannii NOT DETECTED NOT DETECTED Final   Bacteroides fragilis NOT DETECTED NOT DETECTED Final   Enterobacterales DETECTED (A) NOT DETECTED Final    Comment: Enterobacterales represent a large order of gram negative bacteria, not a single organism. CRITICAL RESULT CALLED TO, READ BACK BY AND VERIFIED WITH: ED AT [facility] C MAS [id number] AT 933 AM BY CM    Enterobacter cloacae complex NOT DETECTED NOT DETECTED Final   Escherichia coli DETECTED (A) NOT DETECTED Final    Comment: CRITICAL RESULT CALLED TO, READ BACK BY AND VERIFIED WITH:  ED AT [facility] C MAS [id number] AT 933 AM BY CM    Klebsiella aerogenes NOT DETECTED NOT DETECTED Final    Klebsiella oxytoca NOT DETECTED NOT DETECTED Final   Klebsiella pneumoniae NOT DETECTED NOT DETECTED Final   Proteus species NOT DETECTED NOT DETECTED Final   Salmonella species NOT DETECTED NOT DETECTED Final   Serratia marcescens NOT DETECTED NOT DETECTED Final   Haemophilus influenzae NOT DETECTED NOT DETECTED Final   Neisseria meningitidis NOT DETECTED NOT DETECTED Final   Pseudomonas aeruginosa NOT DETECTED NOT DETECTED Final   Stenotrophomonas maltophilia NOT DETECTED NOT DETECTED Final   Candida albicans NOT DETECTED NOT DETECTED Final   Candida auris NOT DETECTED NOT DETECTED Final   Candida glabrata NOT DETECTED NOT DETECTED Final   Candida krusei NOT DETECTED NOT DETECTED Final   Candida parapsilosis NOT DETECTED NOT DETECTED Final   Candida tropicalis NOT DETECTED NOT DETECTED Final   Cryptococcus neoformans/gattii NOT DETECTED NOT DETECTED Final   CTX-M ESBL NOT DETECTED NOT DETECTED Final   Carbapenem resistance IMP NOT DETECTED NOT DETECTED Final   Carbapenem resistance KPC NOT DETECTED NOT DETECTED Final   Carbapenem resistance NDM NOT DETECTED NOT DETECTED Final   Carbapenem resist OXA 48 LIKE NOT DETECTED NOT DETECTED Final   Carbapenem resistance VIM NOT DETECTED NOT DETECTED Final    Comment: Performed at [facility], [address], [place], [place] [address]     Radiology Studies: NM Hepatobiliary Liver Func  Result Date: 08/10/2023 CLINICAL DATA:  Right upper quadrant abdominal pain. Evaluate for acute cholecystitis. EXAM: NUCLEAR MEDICINE HEPATOBILIARY IMAGING TECHNIQUE: Sequential images of the abdomen were obtained out to 60 minutes following intravenous administration of radiopharmaceutical. RADIOPHARMACEUTICALS:  5.4 mCi Tc-43m  Choletec IV COMPARISON:  CT abdomen and pelvis-08/08/2019; right upper quadrant abdominal pain-08/08/2023 FINDINGS: There is homogeneous distribution of injected radiotracer throughout the hepatic parenchyma. There is early  excretion of radiotracer with opacification of the gallbladder, initially seen on the 10 minute anterior projection of the image. There is  early excretion of radiotracer with opacification of the proximal small bowel, initially seen on the 30 minute anterior projection planar image. IMPRESSION: No scintigraphic evidence of cystic duct obstruction/acute cholecystitis. Electronically Signed   By: Simonne Come M.D.   On: 08/10/2023 10:36   DG Chest Portable 1 View  Result Date: 08/09/2023 CLINICAL DATA:  Right shoulder pain. EXAM: PORTABLE CHEST 1 VIEW COMPARISON:  November 14, 2019 FINDINGS: The heart size and mediastinal contours are within normal limits. Mildly decreased lung volumes are seen with mild elevation of the right hemidiaphragm. There is no evidence of an acute infiltrate, pleural effusion or pneumothorax. The visualized skeletal structures are unremarkable. IMPRESSION: No active disease. Electronically Signed   By: Aram Candela M.D.   On: 08/09/2023 02:20   CT Renal Stone Study  Result Date: 08/08/2023 CLINICAL DATA:  Right-sided abdominal pain and right-sided back pain. EXAM: CT ABDOMEN AND PELVIS WITHOUT CONTRAST TECHNIQUE: Multidetector CT imaging of the abdomen and pelvis was performed following the standard protocol without IV contrast. RADIATION DOSE REDUCTION: This exam was performed according to the departmental dose-optimization program which includes automated exposure control, adjustment of the mA and/or kV according to patient size and/or use of iterative reconstruction technique. COMPARISON:  July 15, 2021 FINDINGS: Lower chest: Mild atelectatic changes are seen within the posterior aspect of the left lung base. Hepatobiliary: No focal liver abnormality is seen. The gallbladder is contracted without evidence of gallstones, gallbladder wall thickening, or biliary dilatation. Pancreas: Unremarkable. No pancreatic ductal dilatation or surrounding inflammatory changes. Spleen: Normal  in size without focal abnormality. Adrenals/Urinary Tract: Adrenal glands are unremarkable. There is mild asymmetric enlargement of the right kidney without focal lesions, hydronephrosis or obstructing renal calculi. 2 mm and 3 mm nonobstructing renal calculi are seen within the mid and lower right kidney. There is mild right-sided perinephric inflammatory fat stranding. Bladder is unremarkable. Stomach/Bowel: Stomach is within normal limits. The appendix is not clearly identified. No evidence of bowel dilatation. Mild inflammatory fat stranding is seen along the posterolateral aspect of the right lower quadrant and pelvis on the right. Vascular/Lymphatic: No significant vascular findings are present. No enlarged abdominal or pelvic lymph nodes. Reproductive: Uterus and bilateral adnexa are unremarkable. Other: No abdominal wall hernia or abnormality. A very small amount of posterior pelvic free fluid is seen. Musculoskeletal: No acute or significant osseous findings. IMPRESSION: 1. Mild asymmetric enlargement of the right kidney with mild right-sided perinephric inflammatory fat stranding. While this may be secondary to a recently passed renal stone, sequelae associated with acute pyelonephritis cannot be excluded. Correlation with urinalysis is recommended. 2. 2 mm and 3 mm nonobstructing right renal calculi. 3. Mild inflammatory fat stranding along the posterolateral aspect of the right lower quadrant and pelvis on the right. Without visualization of a normal appendix, appendicitis cannot be excluded. Further evaluation with follow-up abdomen pelvis CT following oral and intravenous contrast is recommended if appendicitis is of clinical concern. 4. Very small amount of posterior pelvic free fluid, likely physiologic. Electronically Signed   By: Aram Candela M.D.   On: 08/08/2023 22:44   US Abdomen Limited RUQ (LIVER/GB)  Result Date: 08/08/2023 CLINICAL DATA:  Right upper quadrant pain and back pain.  EXAM: ULTRASOUND ABDOMEN LIMITED RIGHT UPPER QUADRANT COMPARISON:  None Available. FINDINGS: Gallbladder: The gallbladder is partially contracted with gallbladder wall thickness of 2.6 mm. No gallstones are visualized. A mild amount of pericholecystic fluid is noted. No sonographic Murphy sign noted by sonographer. Common bile duct: Diameter: 4.7  mm Liver: No focal lesion identified. Within normal limits in parenchymal echogenicity. Portal vein is patent on color Doppler imaging with normal direction of blood flow towards the liver. Other: None. IMPRESSION: 1. Partially contracted gallbladder with subsequent gallbladder wall thickening and mild pericholecystic fluid. 2. No evidence of cholelithiasis or acute cholecystitis. Electronically Signed   By: Aram Candela M.D.   On: 08/08/2023 22:38    Scheduled Meds:  influenza vac split trivalent PF  0.5 mL Intramuscular Tomorrow-1000   lidocaine  2 patch Transdermal Daily   methocarbamol  500 mg Oral TID   pantoprazole (PROTONIX) IV  40 mg Intravenous Q24H   vortioxetine HBr  20 mg Oral Daily   Continuous Infusions:  sodium chloride 125 mL/hr at 08/10/23 1221   cefTRIAXone (ROCEPHIN)  IV Stopped (08/09/23 2051)   metronidazole 500 mg (08/10/23 1013)     LOS: 1 day   Hughie Closs, MD [facility]  08/10/2023, 12:55 PM   *Please note that this is a verbal dictation therefore any spelling or grammatical errors are due to the "[employer]agon [employer] One" system interpretation.  Please page via Amion and do not message via secure chat for urgent patient care matters. Secure chat can be used for non urgent patient care matters.  How to contact the [facility] Attending or Consulting provider 7A - 7P or covering provider during after hours 7P -7A, for this patient?  Check the care team in [facility] and look for a) attending/consulting [facility] provider listed and b) the [facility] team listed. Page or secure chat 7A-7P. Log into [email or web address] and use [place]'s universal  password to access. If you do not have the password, please contact the hospital operator. Locate the [facility] provider you are looking for under [facility] and page to a number that you can be directly reached. If you still have difficulty reaching the provider, please page the [facility] (Director on Call) for the Hospitalists listed on amion for assistance.

## 2023-08-11 ENCOUNTER — Inpatient Hospital Stay (HOSPITAL_COMMUNITY): Payer: BC Managed Care – PPO

## 2023-08-11 DIAGNOSIS — N12 Tubulo-interstitial nephritis, not specified as acute or chronic: Secondary | ICD-10-CM | POA: Diagnosis not present

## 2023-08-11 LAB — COMPREHENSIVE METABOLIC PANEL
ALT: 34 U/L (ref 0–44)
AST: 15 U/L (ref 15–41)
Albumin: 1.8 g/dL — ABNORMAL LOW (ref 3.5–5.0)
Alkaline Phosphatase: 119 U/L (ref 38–126)
Anion gap: 9 (ref 5–15)
BUN: 38 mg/dL — ABNORMAL HIGH (ref 6–20)
CO2: 14 mmol/L — ABNORMAL LOW (ref 22–32)
Calcium: 7.6 mg/dL — ABNORMAL LOW (ref 8.9–10.3)
Chloride: 109 mmol/L (ref 98–111)
Creatinine, Ser: 2.8 mg/dL — ABNORMAL HIGH (ref 0.44–1.00)
GFR, Estimated: 23 mL/min — ABNORMAL LOW (ref >60)
Glucose, Bld: 75 mg/dL (ref 70–99)
Potassium: 3.9 mmol/L (ref 3.5–5.1)
Sodium: 132 mmol/L — ABNORMAL LOW (ref 135–145)
Total Bilirubin: 1.2 mg/dL — ABNORMAL HIGH (ref <1.2)
Total Protein: 4.7 g/dL — ABNORMAL LOW (ref 6.5–8.1)

## 2023-08-11 LAB — CULTURE, BLOOD (ROUTINE X 2)
Special Requests: ADEQUATE
Special Requests: ADEQUATE

## 2023-08-11 LAB — CBC WITH DIFFERENTIAL/PLATELET
Abs Immature Granulocytes: 0.19 10*3/uL — ABNORMAL HIGH (ref 0.00–0.07)
Basophils Absolute: 0 10*3/uL (ref 0.0–0.1)
Basophils Relative: 0 %
Eosinophils Absolute: 0.2 10*3/uL (ref 0.0–0.5)
Eosinophils Relative: 1 %
HCT: 28.6 % — ABNORMAL LOW (ref 36.0–46.0)
Hemoglobin: 9.1 g/dL — ABNORMAL LOW (ref 12.0–15.0)
Immature Granulocytes: 1 %
Lymphocytes Relative: 7 %
Lymphs Abs: 1.2 10*3/uL (ref 0.7–4.0)
MCH: 27.9 pg (ref 26.0–34.0)
MCHC: 31.8 g/dL (ref 30.0–36.0)
MCV: 87.7 fL (ref 80.0–100.0)
Monocytes Absolute: 1 10*3/uL (ref 0.1–1.0)
Monocytes Relative: 6 %
Neutro Abs: 13.3 10*3/uL — ABNORMAL HIGH (ref 1.7–7.7)
Neutrophils Relative %: 85 %
Platelets: 101 10*3/uL — ABNORMAL LOW (ref 150–400)
RBC: 3.26 MIL/uL — ABNORMAL LOW (ref 3.87–5.11)
RDW: 15.7 % — ABNORMAL HIGH (ref 11.5–15.5)
WBC: 15.9 10*3/uL — ABNORMAL HIGH (ref 4.0–10.5)
nRBC: 0 % (ref 0.0–0.2)

## 2023-08-11 MED ORDER — OXYCODONE HCL 5 MG PO TABS
5.0000 mg | ORAL_TABLET | Freq: Four times a day (QID) | ORAL | Status: DC | PRN
  Administered 2023-08-11 – 2023-08-13 (×7): 5 mg via ORAL
  Filled 2023-08-11 (×7): qty 1

## 2023-08-11 MED ORDER — SODIUM BICARBONATE 650 MG PO TABS
650.0000 mg | ORAL_TABLET | Freq: Three times a day (TID) | ORAL | Status: DC
  Administered 2023-08-11 – 2023-08-14 (×9): 650 mg via ORAL
  Filled 2023-08-11 (×9): qty 1

## 2023-08-11 MED ORDER — SODIUM CHLORIDE 0.9 % IV SOLN
3.0000 g | Freq: Four times a day (QID) | INTRAVENOUS | Status: DC
  Administered 2023-08-11 – 2023-08-14 (×11): 3 g via INTRAVENOUS
  Filled 2023-08-11 (×13): qty 8

## 2023-08-11 MED ORDER — HYDROMORPHONE HCL 1 MG/ML IJ SOLN
0.5000 mg | Freq: Three times a day (TID) | INTRAMUSCULAR | Status: DC | PRN
  Administered 2023-08-11: 0.5 mg via SUBCUTANEOUS
  Filled 2023-08-11: qty 0.5

## 2023-08-11 MED ORDER — HYDROMORPHONE HCL 1 MG/ML IJ SOLN
0.5000 mg | Freq: Four times a day (QID) | INTRAMUSCULAR | Status: DC | PRN
  Administered 2023-08-11 – 2023-08-12 (×3): 0.5 mg via SUBCUTANEOUS
  Filled 2023-08-11 (×3): qty 0.5

## 2023-08-11 MED ORDER — LACTATED RINGERS IV SOLN: INTRAVENOUS | Status: AC

## 2023-08-11 MED ORDER — TECHNETIUM TO 99M ALBUMIN AGGREGATED
4.3000 | Freq: Once | INTRAVENOUS | Status: AC
  Administered 2023-08-11: 4.3 via INTRAVENOUS

## 2023-08-11 NOTE — Progress Notes (Signed)
PROGRESS NOTE    Renee Mcdowell  QMV:[id number] DOB: 03-13-1996 DOA: 08/08/2023 PCP: [name], MD   Brief Narrative:  PMH of ADHD, anxiety, depression presented to the hospital with complaints of right-sided back pain, nausea and vomiting. 11/25 seen in the ED with complaints of right sided mid back pain..  Thought to be trapezius muscle straining secondary to overexertion.  (She is currently in the process of moving).  Was discharged with Flexeril and prednisone. 11/29 seen again for the same pain now worsening.  Discharged back home. After going home started to have multiple vomiting episode with worsening pain. 12/2 present again to ED this time with pain involving right upper abdomen area especially after eating.  Was actually seen by PCP who ordered her to go to ED and recommended CT abdomen. Currently being treated for E. coli sepsis, AKI, suspected etiology is pyonephritis versus cholecystitis.  General surgery following.  Assessment & Plan:   Principal Problem:   Pyelonephritis of right kidney Active Problems:   Depression with anxiety   AKI (acute kidney injury) (HCC)   Thrombocytopenia (HCC)  Severe sepsis secondary to right-sided pyelonephritis and E. coli bacteremia: She states that her pain initially started at right shoulder, then periumbilical which radiated to the right lower quadrant and then right flank.  No significant CVA tenderness on my examination.  UA consistent with UTI and CT renal stone study suggesting possible acute pyelonephritis and small 2/3 mm nonobstructing right renal calculi.  Patient was started on Rocephin, blood culture growing pansensitive E. coli but urine culture grew insignificant growth.  Patient is still with pain and leukocytosis which is gradually improving.  Her sepsis parameters are improving.  HIDA negative for acute cholecystitis.  CT renal study concerning for acute cholecystitis as well as acute appendicitis, CT ab and ruled out acute  appendicitis.  Urine culture showed insignificant growth.  Discussed with the mother that we have ruled out PE, cholecystitis and appendicitis and by now, we had hoped that her pain would improve from her mild pyonephritis.  Unsure if she has low tolerance of the pain.  Will have to try to taper pain medications.  Will reduce frequency of Dilaudid to every 8 hours today with the plan to stop tomorrow and reduce oxycodone to every 6 mg as needed as well.  Acute kidney injury. Baseline serum creatinine normal 0.7 based on the labs available 2 years ago however she presented with creatinine of 2.25 which worsened and has plateaued around 2.8 since last 3 days.  CT abdomen completed 08/10/2023 ruled out renal obstruction and there is no hydronephrosis.  Unsure why her creatinine is elevated.  I have consulted nephrology for their opinion.     Elevated LFT. Bilirubin 1.7, AST normal, ALT mildly elevated. Currently parameters are improving.  Monitor values daily.   Acute thrombocytopenia. Platelet count at baseline around 300. Presentation platelet count 127.  Platelets are now improving.  No signs of bleeding.  Anemia-dilutional. Hemoglobin stable.   Hypoxia.  Saturating 88% on room air. Likely atelectasis in the setting of inability to take deep breaths secondary to pain in the right upper quadrant area.  Due to pleuritic chest pain complaint on 08/10/2023, D-dimer was obtained which was elevated, VQ scan which was ordered stat on 08/10/2023 was completed early morning of 08/11/2023 ruled out PE as well.  Depression. Will continue home regimen.   ADHD. Medications are currently on hold.  DVT prophylaxis: Place and maintain sequential compression device Start: 08/09/23 0850  Code Status: Full Code  Family Communication: Mother present at bedside.  Plan of care discussed with patient in length and he/she verbalized understanding and agreed with it.  Status is: Inpatient Remains inpatient  appropriate because: Still with pain and elevated creatinine.   Estimated body mass index is 28.04 kg/m as calculated from the following:   Height as of this encounter: 5\' 6"  (1.676 m).   Weight as of this encounter: 78.8 kg.    Nutritional Assessment: Body mass index is 28.04 kg/m.Marland Kitchen Seen by dietician.  I agree with the assessment and plan as outlined below: Nutrition Status:        . Skin Assessment: I have examined the patient's skin and I agree with the wound assessment as performed by the wound care RN as outlined below:    Consultants:  General surgery  Procedures:  As above  Antimicrobials:  Anti-infectives (From admission, onward)    Start     Dose/Rate Route Frequency Ordered Stop   08/11/23 2000  Ampicillin-Sulbactam (UNASYN) 3 g in sodium chloride 0.9 % 100 mL IVPB        3 g 200 mL/hr over 30 Minutes Intravenous Every 6 hours 08/11/23 1316     08/09/23 2000  cefTRIAXone (ROCEPHIN) 2 g in sodium chloride 0.9 % 100 mL IVPB  Status:  Discontinued        2 g 200 mL/hr over 30 Minutes Intravenous Every 24 hours 08/09/23 0145 08/11/23 1316   08/09/23 1100  metroNIDAZOLE (FLAGYL) IVPB 500 mg  Status:  Discontinued        500 mg 100 mL/hr over 60 Minutes Intravenous Every 12 hours 08/09/23 0252 08/11/23 1316   08/08/23 2300  metroNIDAZOLE (FLAGYL) IVPB 500 mg        500 mg 100 mL/hr over 60 Minutes Intravenous  Once 08/08/23 2256 08/09/23 0024   08/08/23 1915  cefTRIAXone (ROCEPHIN) 2 g in sodium chloride 0.9 % 100 mL IVPB        2 g 200 mL/hr over 30 Minutes Intravenous  Once 08/08/23 1903 08/08/23 2025         Subjective: Patient seen and examined.  Mother was at the bedside.  Patient was complaining of the right upper quadrant abdominal pain however she rated 2/10 because she received medications.  Discussed in length that we will now be planning on tapering her opioid medications.  Objective: Vitals:   08/10/23 2129 08/11/23 0500 08/11/23 0534 08/11/23  1250  BP: (!) 144/89  (!) 144/87 (!) 138/97  Pulse: (!) 107  100 89  Resp: 16  19 16   Temp: 98.4 F (36.9 C)  98.1 F (36.7 C) 98.4 F (36.9 C)  TempSrc: Oral  Oral Oral  SpO2: 96%  96% 94%  Weight:  78.8 kg    Height:        Intake/Output Summary (Last 24 hours) at 08/11/2023 1606 Last data filed at 08/11/2023 1500 Gross per 24 hour  Intake 4445.89 ml  Output 3250 ml  Net 1195.89 ml   Filed Weights   08/08/23 1731 08/09/23 0500 08/11/23 0500  Weight: 72.6 kg 79 kg 78.8 kg    Examination:  General exam: Appears calm and comfortable  Respiratory system: Clear to auscultation. Respiratory effort normal. Cardiovascular system: S1 & S2 heard, RRR. No JVD, murmurs, rubs, gallops or clicks. No pedal edema. Gastrointestinal system: Abdomen is nondistended, soft and periumbilical epigastric, right upper quadrant tenderness but no CVA tenderness. No organomegaly or masses felt. Normal bowel sounds  heard. Central nervous system: Alert and oriented. No focal neurological deficits. Extremities: Symmetric 5 x 5 power. Skin: No rashes, lesions or ulcers.  Psychiatry: Judgement and insight appear normal. Mood & affect appropriate.   Data Reviewed: I have personally reviewed following labs and imaging studies  CBC: Recent Labs  Lab 08/08/23 1735 08/09/23 0444 08/10/23 0720 08/11/23 0514  WBC 19.0* 15.6* 17.1* 15.9*  NEUTROABS  --   --  15.1* 13.3*  HGB 11.8* 10.8* 10.3* 9.1*  HCT 34.8* 33.1* 30.5* 28.6*  MCV 84.1 89.0 87.6 87.7  PLT 127* 98* 80* 101*   Basic Metabolic Panel: Recent Labs  Lab 08/08/23 1735 08/09/23 0444 08/09/23 2027 08/10/23 0720 08/11/23 0514  NA 132* 132* 132* 135 132*  K 3.8 3.2* 4.0 3.8 3.9  CL 99 108 105 108 109  CO2 21* 20* 15* 16* 14*  GLUCOSE 139* 111* 67* 59* 75  BUN 32* 33* 34* 41* 38*  CREATININE 2.25* 2.33* 2.68* 2.82* 2.80*  CALCIUM 8.5* 7.2* 7.7* 7.6* 7.6*  MG  --  2.3  --  2.4  --   PHOS  --  2.5  --   --   --    GFR: Estimated  Creatinine Clearance: 31.4 mL/min (A) (by C-G formula based on SCr of 2.8 mg/dL (H)). Liver Function Tests: Recent Labs  Lab 08/08/23 1735 08/09/23 0444 08/10/23 0720 08/11/23 0514  AST 32 28 20 15   ALT 92* 70* 48* 34  ALKPHOS 132* 128* 100 119  BILITOT 1.7* 1.2* 1.4* 1.2*  PROT 5.9* 5.2* 4.9* 4.7*  ALBUMIN 3.2* 2.1* 1.9* 1.8*   Recent Labs  Lab 08/08/23 1735  LIPASE <10*   No results for input(s): "AMMONIA" in the last 168 hours. Coagulation Profile: No results for input(s): "INR", "PROTIME" in the last 168 hours. Cardiac Enzymes: No results for input(s): "CKTOTAL", "CKMB", "CKMBINDEX", "TROPONINI" in the last 168 hours. BNP (last 3 results) No results for input(s): "PROBNP" in the last 8760 hours. HbA1C: No results for input(s): "HGBA1C" in the last 72 hours. CBG: No results for input(s): "GLUCAP" in the last 168 hours. Lipid Profile: No results for input(s): "CHOL", "HDL", "LDLCALC", "TRIG", "CHOLHDL", "LDLDIRECT" in the last 72 hours. Thyroid Function Tests: No results for input(s): "TSH", "T4TOTAL", "FREET4", "T3FREE", "THYROIDAB" in the last 72 hours. Anemia Panel: No results for input(s): "VITAMINB12", "FOLATE", "FERRITIN", "TIBC", "IRON", "RETICCTPCT" in the last 72 hours. Sepsis Labs: Recent Labs  Lab 08/08/23 1935  LATICACIDVEN 1.4    Recent Results (from the past 240 hour(s))  Blood culture (routine x 2)     Status: Abnormal   Collection Time: 08/08/23  7:34 PM   Specimen: BLOOD  Result Value Ref Range Status   Specimen Description   Final    BLOOD LEFT ANTECUBITAL Performed at [facility], [address] [place], [place] [address]    Special Requests   Final    BOTTLES DRAWN AEROBIC AND ANAEROBIC Blood Culture adequate volume Performed at [facility], [address], [place], [place] [address]    Culture  Setup Time   Final    GRAM NEGATIVE RODS IN BOTH AEROBIC AND ANAEROBIC BOTTLES CRITICAL VALUE  NOTED.  VALUE IS CONSISTENT WITH PREVIOUSLY REPORTED AND CALLED VALUE.    Culture (A)  Final    ESCHERICHIA COLI SUSCEPTIBILITIES PERFORMED ON PREVIOUS CULTURE WITHIN THE LAST 5 DAYS. Performed at [facility], [address] [place], [place] [address]    Report Status 08/11/2023 FINAL  Final  Blood culture (routine x 2)     Status: Abnormal   Collection Time: 08/08/23  7:34 PM   Specimen: BLOOD  Result Value Ref Range Status   Specimen Description   Final    BLOOD RIGHT ANTECUBITAL Performed at [facility], [address], [place], [place] [address]    Special Requests   Final    BOTTLES DRAWN AEROBIC AND ANAEROBIC Blood Culture adequate volume Performed at [facility], [address], [place], [place] [address]    Culture  Setup Time   Final    GRAM NEGATIVE RODS IN BOTH AEROBIC AND ANAEROBIC BOTTLES CRITICAL RESULT CALLED TO, READ BACK BY AND VERIFIED WITH: C MAS AT [facility] [id number] AT 933 AM BY CM CRITICAL RESULT CALLED TO, READ BACK BY AND VERIFIED WITH: PHARMD A PHAM [id number] AT 939 AM BY CM Performed at [facility], [address] [place], [place] [address]    Culture ESCHERICHIA COLI (A)  Final   Report Status 08/11/2023 FINAL  Final   Organism ID, Bacteria ESCHERICHIA COLI  Final   Organism ID, Bacteria ESCHERICHIA COLI  Final      Susceptibility   Escherichia coli - KIRBY BAUER*    CEFAZOLIN SENSITIVE Sensitive    Escherichia coli - MIC*    AMPICILLIN <=2 SENSITIVE Sensitive     CEFEPIME <=0.12 SENSITIVE Sensitive     CEFTAZIDIME <=1 SENSITIVE Sensitive     CEFTRIAXONE <=0.25 SENSITIVE Sensitive     CIPROFLOXACIN <=0.25 SENSITIVE Sensitive     GENTAMICIN <=1 SENSITIVE Sensitive     IMIPENEM <=0.25 SENSITIVE Sensitive     TRIMETH/SULFA <=20 SENSITIVE Sensitive     AMPICILLIN/SULBACTAM <=2 SENSITIVE Sensitive     PIP/TAZO <=4 SENSITIVE Sensitive ug/mL    * ESCHERICHIA COLI    ESCHERICHIA COLI  Blood Culture ID  Panel (Reflexed)     Status: Abnormal   Collection Time: 08/08/23  7:34 PM  Result Value Ref Range Status   Enterococcus faecalis NOT DETECTED NOT DETECTED Final   Enterococcus Faecium NOT DETECTED NOT DETECTED Final   Listeria monocytogenes NOT DETECTED NOT DETECTED Final   Staphylococcus species NOT DETECTED NOT DETECTED Final   Staphylococcus aureus (BCID) NOT DETECTED NOT DETECTED Final   Staphylococcus epidermidis NOT DETECTED NOT DETECTED Final   Staphylococcus lugdunensis NOT DETECTED NOT DETECTED Final   Streptococcus species NOT DETECTED NOT DETECTED Final   Streptococcus agalactiae NOT DETECTED NOT DETECTED Final   Streptococcus pneumoniae NOT DETECTED NOT DETECTED Final   Streptococcus pyogenes NOT DETECTED NOT DETECTED Final   A.calcoaceticus-baumannii NOT DETECTED NOT DETECTED Final   Bacteroides fragilis NOT DETECTED NOT DETECTED Final   Enterobacterales DETECTED (A) NOT DETECTED Final    Comment: Enterobacterales represent a large order of gram negative bacteria, not a single organism. CRITICAL RESULT CALLED TO, READ BACK BY AND VERIFIED WITH: ED AT [facility] C MAS [id number] AT 933 AM BY CM    Enterobacter cloacae complex NOT DETECTED NOT DETECTED Final   Escherichia coli DETECTED (A) NOT DETECTED Final    Comment: CRITICAL RESULT CALLED TO, READ BACK BY AND VERIFIED WITH:  ED AT [facility] C MAS [id number] AT 933 AM BY CM    Klebsiella aerogenes NOT DETECTED NOT DETECTED Final   Klebsiella oxytoca NOT DETECTED NOT DETECTED Final   Klebsiella pneumoniae NOT DETECTED NOT DETECTED Final   Proteus species NOT DETECTED NOT DETECTED Final   Salmonella species NOT DETECTED NOT DETECTED Final  Serratia marcescens NOT DETECTED NOT DETECTED Final   Haemophilus influenzae NOT DETECTED NOT DETECTED Final   Neisseria meningitidis NOT DETECTED NOT DETECTED Final   Pseudomonas aeruginosa NOT DETECTED NOT DETECTED Final   Stenotrophomonas maltophilia NOT DETECTED NOT DETECTED Final   Candida  albicans NOT DETECTED NOT DETECTED Final   Candida auris NOT DETECTED NOT DETECTED Final   Candida glabrata NOT DETECTED NOT DETECTED Final   Candida krusei NOT DETECTED NOT DETECTED Final   Candida parapsilosis NOT DETECTED NOT DETECTED Final   Candida tropicalis NOT DETECTED NOT DETECTED Final   Cryptococcus neoformans/gattii NOT DETECTED NOT DETECTED Final   CTX-M ESBL NOT DETECTED NOT DETECTED Final   Carbapenem resistance IMP NOT DETECTED NOT DETECTED Final   Carbapenem resistance KPC NOT DETECTED NOT DETECTED Final   Carbapenem resistance NDM NOT DETECTED NOT DETECTED Final   Carbapenem resist OXA 48 LIKE NOT DETECTED NOT DETECTED Final   Carbapenem resistance VIM NOT DETECTED NOT DETECTED Final    Comment: Performed at [facility], [address] [place], [place] [address]  Urine Culture     Status: Abnormal   Collection Time: 08/08/23  7:35 PM   Specimen: Urine, Clean Catch  Result Value Ref Range Status   Specimen Description   Final    URINE, CLEAN CATCH Performed at [facility], [address], [place], [place] [address]    Special Requests   Final    NONE Performed at [facility], [address], [place], [place] [address]    Culture (A)  Final    <10,000 COLONIES/mL INSIGNIFICANT GROWTH Performed at [facility], [address], [place], [place] [address]    Report Status 08/10/2023 FINAL  Final     Radiology Studies: NM Pulmonary Perf and Vent  Result Date: 08/11/2023 CLINICAL DATA:  Elevated D-dimer. Short of breath. Low saturations. Concern for pulmonary embolism. EXAM: NUCLEAR MEDICINE PERFUSION LUNG SCAN TECHNIQUE: Perfusion images were obtained in multiple projections after intravenous injection of radiopharmaceutical. Ventilation scans intentionally deferred if perfusion scan and chest x-ray adequate for interpretation during COVID 19 epidemic. RADIOPHARMACEUTICALS:  4.3 mCi Tc-54m MAA IV COMPARISON:   Chest radiograph 08/08/2023, CT abdomen 08/10/2023 FINDINGS: No wedge-shaped peripheral perfusion defects in the LEFT or RIGHT lung to suggest acute pulmonary embolism. IMPRESSION: No evidence acute pulmonary embolism. Electronically Signed   By: Genevive Bi M.D.   On: 08/11/2023 10:25   CT ABDOMEN PELVIS WO CONTRAST  Result Date: 08/10/2023 CLINICAL DATA:  Right lower quadrant abdominal pain. EXAM: CT ABDOMEN AND PELVIS WITHOUT CONTRAST TECHNIQUE: Multidetector CT imaging of the abdomen and pelvis was performed following the standard protocol without IV contrast. RADIATION DOSE REDUCTION: This exam was performed according to the departmental dose-optimization program which includes automated exposure control, adjustment of the mA and/or kV according to patient size and/or use of iterative reconstruction technique. COMPARISON:  August 08, 2023. FINDINGS: Lower chest: Small pleural effusions are noted with adjacent subsegmental atelectasis or infiltrates. Hepatobiliary: Hepatic steatosis. No cholelithiasis or biliary dilatation. Pancreas: Unremarkable. No pancreatic ductal dilatation or surrounding inflammatory changes. Spleen: Normal in size without focal abnormality. Adrenals/Urinary Tract: Adrenal glands appear normal. Bilateral nonobstructive nephrolithiasis is noted. No definite hydronephrosis or renal obstruction is noted. Urinary bladder is unremarkable. Stomach/Bowel: Stomach is within normal limits. Appendix appears normal. No evidence of bowel wall thickening, distention, or inflammatory changes. Vascular/Lymphatic: No significant vascular findings are present. No enlarged abdominal or pelvic lymph nodes. Reproductive: Uterus and bilateral adnexa  are unremarkable. Other: Small amount of free fluid is noted in the pelvis which may be physiologic. No hernia is noted. Musculoskeletal: No acute or significant osseous findings. IMPRESSION: Small pleural effusions are noted with adjacent subsegmental  atelectasis or infiltrates. Bilateral nonobstructive nephrolithiasis. Hepatic steatosis. Small amount of free fluid is noted in the pelvis which may be physiologic. Electronically Signed   By: Lupita Raider M.D.   On: 08/10/2023 15:20   NM Hepatobiliary Liver Func  Result Date: 08/10/2023 CLINICAL DATA:  Right upper quadrant abdominal pain. Evaluate for acute cholecystitis. EXAM: NUCLEAR MEDICINE HEPATOBILIARY IMAGING TECHNIQUE: Sequential images of the abdomen were obtained out to 60 minutes following intravenous administration of radiopharmaceutical. RADIOPHARMACEUTICALS:  5.4 mCi Tc-69m  Choletec IV COMPARISON:  CT abdomen and pelvis-08/08/2019; right upper quadrant abdominal pain-08/08/2023 FINDINGS: There is homogeneous distribution of injected radiotracer throughout the hepatic parenchyma. There is early excretion of radiotracer with opacification of the gallbladder, initially seen on the 10 minute anterior projection of the image. There is early excretion of radiotracer with opacification of the proximal small bowel, initially seen on the 30 minute anterior projection planar image. IMPRESSION: No scintigraphic evidence of cystic duct obstruction/acute cholecystitis. Electronically Signed   By: Simonne Come M.D.   On: 08/10/2023 10:36    Scheduled Meds:  influenza vac split trivalent PF  0.5 mL Intramuscular Tomorrow-1000   lidocaine  2 patch Transdermal Daily   methocarbamol  500 mg Oral TID   pantoprazole (PROTONIX) IV  40 mg Intravenous Q24H   vortioxetine HBr  20 mg Oral Daily   Continuous Infusions:  sodium chloride 125 mL/hr at 08/11/23 0706   ampicillin-sulbactam (UNASYN) IV       LOS: 2 days   Hughie Closs, MD [facility]  08/11/2023, 4:06 PM   *Please note that this is a verbal dictation therefore any spelling or grammatical errors are due to the "Dragon Medical One" system interpretation.  Please page via Amion and do not message via secure chat for urgent patient  care matters. Secure chat can be used for non urgent patient care matters.  How to contact the [facility] Attending or Consulting provider 7A - 7P or covering provider during after hours 7P -7A, for this patient?  Check the care team in [facility] and look for a) attending/consulting [facility] provider listed and b) the [facility] team listed. Page or secure chat 7A-7P. Log into [email or web address] and use [place]'s universal password to access. If you do not have the password, please contact the hospital operator. Locate the [facility] provider you are looking for under [facility] and page to a number that you can be directly reached. If you still have difficulty reaching the provider, please page the [facility] (Director on Call) for the Hospitalists listed on amion for assistance.

## 2023-08-11 NOTE — Plan of Care (Signed)
  Problem: Education: Goal: Knowledge of General Education information will improve Description: Including pain rating scale, medication(s)/side effects and non-pharmacologic comfort measures Outcome: Progressing   Problem: Health Behavior/Discharge Planning: Goal: Ability to manage health-related needs will improve Outcome: Progressing   Problem: Clinical Measurements: Goal: Respiratory complications will improve Outcome: Progressing   Problem: Activity: Goal: Risk for activity intolerance will decrease Outcome: Progressing   Problem: Coping: Goal: Level of anxiety will decrease Outcome: Progressing   Problem: Elimination: Goal: Will not experience complications related to bowel motility Outcome: Progressing

## 2023-08-11 NOTE — Consult Note (Signed)
[facility] Renal Consultation Note  Requesting MD: Hughie Closs, MD Indication for Consultation:  AKI   Chief complaint: abdominal pain, n/v, and chills   HPI:  Renee Mcdowell is a 27 y.o. female with a history of depression, anxiety, and ADHD who presented to the ER with abdominal and flank pain accompanied by n/v and shaking chills.  She couldn't even keep water down.  Prior to this presentation she felt that she had pulled a back muscle moving and had been taking ibuprofen.  At one point she got steroids from an urgent care and got a toradol injection through [facility] on 11/25.  She eventually progressed and came in to [facility] and was transferred to [facility].  She was diagnosed with pyelonephritis.  She had a CT abdomen with mild perinephric stranding and mild asymmetric enlargement of the right kidney; non-obstructing nephrolithiasis.  (Findings felt to be secondary to pyelo vs recently passed stone).  She was later also found to have e coli bacteremia.  There was also concern for acute cholecystitis and she had a HIDA scan which was not consistent with acute cholecystitis.  Nephrology is consulted for AKI.  She has been on 125 ml/hr of normal saline.  She had 3.4 liters UOP over 12/4 charted as well as 2 unmeasured urine voids.  Baseline Cr is less than 1.  She had a negative urine pregnancy test.  Her mother and two friends were at bedside and she gave permission for our interview.  She is worried about pain control.  Nights have been rough.  She has had 7-8/10 pain and it has been hard to get a handle on.  She states that she thinks she has a high pain tolerance - at one point was active despite an undiagnosed compound fracture.  She has had some shortness of breath and hurts to take a deep breath.   Creatinine, Ser  Date/Time Value Ref Range Status  08/11/2023 05:14 AM 2.80 (H) 0.44 - 1.00 mg/dL Final  60/45/4098 11:91 AM 2.82 (H) 0.44 - 1.00 mg/dL Final   47/82/9562 13:08 PM 2.68 (H) 0.44 - 1.00 mg/dL Final  65/78/4696 29:52 AM 2.33 (H) 0.44 - 1.00 mg/dL Final  84/13/2440 10:27 PM 2.25 (H) 0.44 - 1.00 mg/dL Final  25/36/6440 34:74 AM 0.73 0.44 - 1.00 mg/dL Final  25/95/6387 56:43 AM 1.03 (H) 0.44 - 1.00 mg/dL Final  32/95/1884 16:60 AM 0.98 0.44 - 1.00 mg/dL Final  63/09/6008 93:23 AM 0.91 0.44 - 1.00 mg/dL Final     PMHx:   Past Medical History:  Diagnosis Date   ADHD (attention deficit hyperactivity disorder)    Anxiety    Depression     History reviewed. No pertinent surgical history.  Family Hx:  Family History  Problem Relation Age of Onset   Mental illness Father        Schizoaffective disorder   Thyroid disease Mother    Valvular heart disease Mother     Social History:  reports that she has quit smoking. Her smoking use included cigarettes. She has never used smokeless tobacco. She reports current alcohol use of about 2.0 standard drinks of alcohol per week. She reports that she does not use drugs.  Allergies:  Allergies  Allergen Reactions   Azithromycin Hives    Medications: Prior to Admission medications   Medication Sig Start Date End Date Taking? Authorizing Provider  amphetamine-dextroamphetamine (ADDERALL XR) 20 MG 24 hr capsule Take 20 mg by mouth daily.  Yes [provider]  amphetamine-dextroamphetamine (ADDERALL) 20 MG tablet Take 20 mg by mouth 2 (two) times daily.   Yes [provider]  atomoxetine (STRATTERA) 100 MG capsule Take 10 mg by mouth daily.   Yes [provider]  cyclobenzaprine (FLEXERIL) 10 MG tablet Take 1 tablet (10 mg total) by mouth 2 (two) times daily as needed for muscle spasms. 08/01/23  Yes White, Elizabeth A, PA-C  hydrOXYzine (ATARAX/VISTARIL) 25 MG tablet Take 1 tablet (25 mg total) by mouth every 6 (six) hours as needed for anxiety. 12/31/16  Yes Oneta Rack, NP  predniSONE (STERAPRED UNI-PAK 21 TAB) 10 MG (21) TBPK tablet Take by mouth daily.  Take 6 tabs by mouth daily for 2 days, then 5 tabs for 2 days, then 4 tabs for 2 days, then 3 tabs for 2 days, 2 tabs for 2 days, then 1 tab by mouth daily for 2 days 08/01/23  Yes White, Elizabeth A, PA-C  tretinoin (RETIN-A) 0.025 % cream Apply 1 Application topically at bedtime. 07/16/15  Yes [provider]  triamcinolone cream (KENALOG) 0.5 % Apply 1 Application topically 3 (three) times daily.   Yes [provider]  TRINTELLIX 20 MG TABS tablet Take 20 mg by mouth daily. 10/24/19  Yes [provider]   I have reviewed the patient's current and reported prior to admission medications.  Labs:     Latest Ref Rng & Units 08/11/2023    5:14 AM 08/10/2023    7:20 AM 08/09/2023    8:27 PM  BMP  Glucose 70 - 99 mg/dL 75  59  67   BUN 6 - 20 mg/dL 38  41  34   Creatinine 0.44 - 1.00 mg/dL 0.86  5.78  4.69   Sodium 135 - 145 mmol/L 132  135  132   Potassium 3.5 - 5.1 mmol/L 3.9  3.8  4.0   Chloride 98 - 111 mmol/L 109  108  105   CO2 22 - 32 mmol/L 14  16  15    Calcium 8.9 - 10.3 mg/dL 7.6  7.6  7.7     Urinalysis    Component Value Date/Time   COLORURINE YELLOW 08/08/2023 1935   APPEARANCEUR HAZY (A) 08/08/2023 1935   LABSPEC 1.015 08/08/2023 1935   PHURINE 6.0 08/08/2023 1935   GLUCOSEU NEGATIVE 08/08/2023 1935   HGBUR MODERATE (A) 08/08/2023 1935   BILIRUBINUR NEGATIVE 08/08/2023 1935   KETONESUR NEGATIVE 08/08/2023 1935   PROTEINUR 100 (A) 08/08/2023 1935   NITRITE NEGATIVE 08/08/2023 1935   LEUKOCYTESUR LARGE (A) 08/08/2023 1935     ROS:  Pertinent items noted in HPI and remainder of comprehensive ROS otherwise negative.  Physical Exam: Vitals:   08/11/23 0534 08/11/23 1250  BP: (!) 144/87 (!) 138/97  Pulse: 100 89  Resp: 19 16  Temp: 98.1 F (36.7 C) 98.4 F (36.9 C)  SpO2: 96% 94%     General: adult female in bed in no acute distress at rest HEENT: NCAT Eyes: EOMI sclera anicteric Neck: supple trachea midline  Heart: S1S2  tachycardic Lungs: clear to auscultation; on oxygen per nasal cannula  Abdomen: some guarding; upper abdominal tenderness to palpation Extremities: no edema; no cyanosis or clubbing  Skin: no rash on extremities exposed Neuro: alert and oriented x3 provides hx and follows commands Psych normal mood and affect  Assessment/Plan:  # AKI  - Secondary to pre-renal insults and NSAID use in the setting of sepsis and pyelo.  Query recently  passed stone as well.  RBC's in urine in setting of pyelo and stones.   - Thankfully AKI has plateaued - Please avoid NSAID's here and on discharge - Continue fluids - finish current bag of normal saline then transition to LR at 75 ml/hr.   - quantify urine protein losses - check up/cr ratio - Avoid hypotension   - Would reduce dosing frequency for unasyn to every 12 hours instead of every 6- I have reached out to her primary team   # Sepsis - Secondary to E coli bacteremia - which may be due to  pyelonephritis  - Blood cultures drawn 12/2 with bacteremia - Would repeat cultures per primary team   - she is on unasyn per primary team  # Pyelonephritis  - antibiotics per primary team  - Continue fluids and pain control as well as anti-emetics  # Elevated blood pressure without diagnosis of HTN - Getting fluids - Here with sepsis so thankfully not hypotensive   # Metabolic acidosis - Getting bicarb-free fluids but here with sepsis and AKI - lactic acid ok  - Start sodium bicarbonate this evening - 650 mg TID for now    # Normocytic Anemia   - Check iron stores in AM - CBC in AM  Would continue inpatient monitoring.  AKI has plateaued thankfully.  Would need improved pain control and to be tolerating PO prior to discharge from a nephrology standpoint.  We will need to set up nephrology follow-up    Estanislado Emms 08/11/2023, 5:09 PM

## 2023-08-12 DIAGNOSIS — N12 Tubulo-interstitial nephritis, not specified as acute or chronic: Secondary | ICD-10-CM | POA: Diagnosis not present

## 2023-08-12 LAB — COMPREHENSIVE METABOLIC PANEL
ALT: 26 U/L (ref 0–44)
AST: 19 U/L (ref 15–41)
Albumin: 1.6 g/dL — ABNORMAL LOW (ref 3.5–5.0)
Alkaline Phosphatase: 124 U/L (ref 38–126)
Anion gap: 9 (ref 5–15)
BUN: 34 mg/dL — ABNORMAL HIGH (ref 6–20)
CO2: 15 mmol/L — ABNORMAL LOW (ref 22–32)
Calcium: 7.2 mg/dL — ABNORMAL LOW (ref 8.9–10.3)
Chloride: 106 mmol/L (ref 98–111)
Creatinine, Ser: 2.47 mg/dL — ABNORMAL HIGH (ref 0.44–1.00)
GFR, Estimated: 26 mL/min — ABNORMAL LOW (ref >60)
Glucose, Bld: 80 mg/dL (ref 70–99)
Potassium: 4.4 mmol/L (ref 3.5–5.1)
Sodium: 130 mmol/L — ABNORMAL LOW (ref 135–145)
Total Bilirubin: 1.6 mg/dL — ABNORMAL HIGH (ref <1.2)
Total Protein: 4.4 g/dL — ABNORMAL LOW (ref 6.5–8.1)

## 2023-08-12 LAB — CBC
HCT: 29 % — ABNORMAL LOW (ref 36.0–46.0)
Hemoglobin: 9.3 g/dL — ABNORMAL LOW (ref 12.0–15.0)
MCH: 28.4 pg (ref 26.0–34.0)
MCHC: 32.1 g/dL (ref 30.0–36.0)
MCV: 88.4 fL (ref 80.0–100.0)
Platelets: 134 10*3/uL — ABNORMAL LOW (ref 150–400)
RBC: 3.28 MIL/uL — ABNORMAL LOW (ref 3.87–5.11)
RDW: 15.6 % — ABNORMAL HIGH (ref 11.5–15.5)
WBC: 14 10*3/uL — ABNORMAL HIGH (ref 4.0–10.5)
nRBC: 0 % (ref 0.0–0.2)

## 2023-08-12 LAB — IRON AND TIBC
Iron: 59 ug/dL (ref 28–170)
Saturation Ratios: 33 % — ABNORMAL HIGH (ref 10.4–31.8)
TIBC: 179 ug/dL — ABNORMAL LOW (ref 250–450)
UIBC: 120 ug/dL

## 2023-08-12 LAB — FERRITIN: Ferritin: 217 ng/mL (ref 11–307)

## 2023-08-12 MED ORDER — PANTOPRAZOLE SODIUM 40 MG PO TBEC
40.0000 mg | DELAYED_RELEASE_TABLET | Freq: Every day | ORAL | Status: DC
  Administered 2023-08-12 – 2023-08-13 (×2): 40 mg via ORAL
  Filled 2023-08-12 (×2): qty 1

## 2023-08-12 NOTE — Progress Notes (Signed)
PROGRESS NOTE    Renee Mcdowell  ZOX:[id number] DOB: 30-Aug-1996 DOA: 08/08/2023 PCP: [name], MD   Brief Narrative:  PMH of ADHD, anxiety, depression presented to the hospital with complaints of right-sided back pain, nausea and vomiting. 11/25 seen in the ED with complaints of right sided mid back pain..  Thought to be trapezius muscle straining secondary to overexertion.  (She is currently in the process of moving).  Was discharged with Flexeril and prednisone. 11/29 seen again for the same pain now worsening.  Discharged back home. After going home started to have multiple vomiting episode with worsening pain. 12/2 present again to ED this time with pain involving right upper abdomen area especially after eating.  Was actually seen by PCP who ordered her to go to ED and recommended CT abdomen. Currently being treated for E. coli sepsis, AKI, suspected etiology is pyonephritis versus cholecystitis.  General surgery following.  Assessment & Plan:   Principal Problem:   Pyelonephritis of right kidney Active Problems:   Depression with anxiety   AKI (acute kidney injury) (HCC)   Thrombocytopenia (HCC)  Severe sepsis secondary to right-sided pyelonephritis and E. coli bacteremia: She states that her pain initially started at right shoulder, then periumbilical which radiated to the right lower quadrant and then right flank.  No significant CVA tenderness on my examination.  UA consistent with UTI and CT renal stone study suggesting possible acute pyelonephritis and small 2/3 mm nonobstructing right renal calculi.  Patient was started on Rocephin, blood culture growing pansensitive E. coli but urine culture grew insignificant growth.  Patient is still with pain and leukocytosis which is gradually improving.  Her sepsis parameters are improving.  HIDA negative for acute cholecystitis.  CT renal study concerning for acute cholecystitis as well as acute appendicitis, CT abd ruled out acute  appendicitis.  PE ruled out as well.  In order to taper her pain medications, we reduced frequency of Dilaudid to every 6 hours with and oxycodone 5 mg every 6 hours as well.  She has been requesting for pain medication around-the-clock every 3 hours.  Her pain is only 4 out of 10.  I had a lengthy discussion with the patient and her mother that at this time that we start weaning aggressively to prepare her for discharge most likely in next 1 to 2 days.  They are in agreement with stopping Dilaudid and continuing oxycodone until tomorrow.    Acute kidney injury. Baseline serum creatinine normal 0.7 based on the labs available 2 years ago however she presented with creatinine of 2.25 which worsened and had plateaued around 2.8 since last 3 days.  CT abdomen completed 08/10/2023 ruled out renal obstruction and there is no hydronephrosis.  Nephrology consulted, evaluated by them and they opined that her AKI is likely secondary to NSAID utilization due to pain.  They recommended continuing LR.  Patient's creatinine has started to gradually improve and is down to 2.45 today.  Nephrology has signed off and has arranged outpatient follow-up for her.   Elevated LFT. Bilirubin 1.7, AST normal, ALT mildly elevated. Currently parameters are improving.  Monitor values daily.   Acute thrombocytopenia. Platelet count at baseline around 300. Presentation platelet count 127.  Platelets are now improving.  No signs of bleeding.  Anemia-dilutional. Hemoglobin stable.   Hypoxia: Resolved patient saturating 99% on room air.  PE ruled out.  Depression. Will continue home regimen.   ADHD. Medications are currently on hold.  DVT prophylaxis: Place and maintain  sequential compression device Start: 08/09/23 0850   Code Status: Full Code  Family Communication: Mother present at bedside.  Plan of care discussed with patient in length and he/she verbalized understanding and agreed with it.  Status is:  Inpatient Remains inpatient appropriate because: Pain is improving, creatinine improving.  Needs further monitoring.   Estimated body mass index is 30.85 kg/m as calculated from the following:   Height as of this encounter: 5\' 6"  (1.676 m).   Weight as of this encounter: 86.7 kg.    Nutritional Assessment: Body mass index is 30.85 kg/m.Marland Kitchen Seen by dietician.  I agree with the assessment and plan as outlined below: Nutrition Status:        . Skin Assessment: I have examined the patient's skin and I agree with the wound assessment as performed by the wound care RN as outlined below:    Consultants:  General surgery  Procedures:  As above  Antimicrobials:  Anti-infectives (From admission, onward)    Start     Dose/Rate Route Frequency Ordered Stop   08/11/23 2000  Ampicillin-Sulbactam (UNASYN) 3 g in sodium chloride 0.9 % 100 mL IVPB        3 g 200 mL/hr over 30 Minutes Intravenous Every 6 hours 08/11/23 1316     08/09/23 2000  cefTRIAXone (ROCEPHIN) 2 g in sodium chloride 0.9 % 100 mL IVPB  Status:  Discontinued        2 g 200 mL/hr over 30 Minutes Intravenous Every 24 hours 08/09/23 0145 08/11/23 1316   08/09/23 1100  metroNIDAZOLE (FLAGYL) IVPB 500 mg  Status:  Discontinued        500 mg 100 mL/hr over 60 Minutes Intravenous Every 12 hours 08/09/23 0252 08/11/23 1316   08/08/23 2300  metroNIDAZOLE (FLAGYL) IVPB 500 mg        500 mg 100 mL/hr over 60 Minutes Intravenous  Once 08/08/23 2256 08/09/23 0024   08/08/23 1915  cefTRIAXone (ROCEPHIN) 2 g in sodium chloride 0.9 % 100 mL IVPB        2 g 200 mL/hr over 30 Minutes Intravenous  Once 08/08/23 1903 08/08/23 2025         Subjective: Patient seen and 7.  Pain is only 4 out of 10, right upper quadrant.  No other complaint.  Mother at the bedside.  Objective: Vitals:   08/11/23 1250 08/11/23 2032 08/12/23 0500 08/12/23 0602  BP: (!) 138/97 (!) 149/93  (!) 149/94  Pulse: 89 97  86  Resp: 16 17  17   Temp: 98.4  F (36.9 C) 98.5 F (36.9 C)  98 F (36.7 C)  TempSrc: Oral Oral  Oral  SpO2: 94% 99%  99%  Weight:   86.7 kg   Height:        Intake/Output Summary (Last 24 hours) at 08/12/2023 1235 Last data filed at 08/12/2023 1000 Gross per 24 hour  Intake 3925.98 ml  Output 3450 ml  Net 475.98 ml   Filed Weights   08/09/23 0500 08/11/23 0500 08/12/23 0500  Weight: 79 kg 78.8 kg 86.7 kg    Examination:  General exam: Appears calm and comfortable  Respiratory system: Clear to auscultation. Respiratory effort normal. Cardiovascular system: S1 & S2 heard, RRR. No JVD, murmurs, rubs, gallops or clicks. No pedal edema. Gastrointestinal system: Abdomen is nondistended, soft and mild epigastric and right upper quadrant tenderness, much improved compared to yesterday.  No CVA tenderness. No organomegaly or masses felt. Normal bowel sounds heard. Central nervous system:  Alert and oriented. No focal neurological deficits. Extremities: Symmetric 5 x 5 power. Skin: No rashes, lesions or ulcers.  Psychiatry: Judgement and insight appear normal. Mood & affect appropriate.   Data Reviewed: I have personally reviewed following labs and imaging studies  CBC: Recent Labs  Lab 08/08/23 1735 08/09/23 0444 08/10/23 0720 08/11/23 0514 08/12/23 0642  WBC 19.0* 15.6* 17.1* 15.9* 14.0*  NEUTROABS  --   --  15.1* 13.3*  --   HGB 11.8* 10.8* 10.3* 9.1* 9.3*  HCT 34.8* 33.1* 30.5* 28.6* 29.0*  MCV 84.1 89.0 87.6 87.7 88.4  PLT 127* 98* 80* 101* 134*   Basic Metabolic Panel: Recent Labs  Lab 08/09/23 0444 08/09/23 2027 08/10/23 0720 08/11/23 0514 08/12/23 0457  NA 132* 132* 135 132* 130*  K 3.2* 4.0 3.8 3.9 4.4  CL 108 105 108 109 106  CO2 20* 15* 16* 14* 15*  GLUCOSE 111* 67* 59* 75 80  BUN 33* 34* 41* 38* 34*  CREATININE 2.33* 2.68* 2.82* 2.80* 2.47*  CALCIUM 7.2* 7.7* 7.6* 7.6* 7.2*  MG 2.3  --  2.4  --   --   PHOS 2.5  --   --   --   --    GFR: Estimated Creatinine Clearance: 37.3  mL/min (A) (by C-G formula based on SCr of 2.47 mg/dL (H)). Liver Function Tests: Recent Labs  Lab 08/08/23 1735 08/09/23 0444 08/10/23 0720 08/11/23 0514 08/12/23 0457  AST 32 28 20 15 19   ALT 92* 70* 48* 34 26  ALKPHOS 132* 128* 100 119 124  BILITOT 1.7* 1.2* 1.4* 1.2* 1.6*  PROT 5.9* 5.2* 4.9* 4.7* 4.4*  ALBUMIN 3.2* 2.1* 1.9* 1.8* 1.6*   Recent Labs  Lab 08/08/23 1735  LIPASE <10*   No results for input(s): "AMMONIA" in the last 168 hours. Coagulation Profile: No results for input(s): "INR", "PROTIME" in the last 168 hours. Cardiac Enzymes: No results for input(s): "CKTOTAL", "CKMB", "CKMBINDEX", "TROPONINI" in the last 168 hours. BNP (last 3 results) No results for input(s): "PROBNP" in the last 8760 hours. HbA1C: No results for input(s): "HGBA1C" in the last 72 hours. CBG: No results for input(s): "GLUCAP" in the last 168 hours. Lipid Profile: No results for input(s): "CHOL", "HDL", "LDLCALC", "TRIG", "CHOLHDL", "LDLDIRECT" in the last 72 hours. Thyroid Function Tests: No results for input(s): "TSH", "T4TOTAL", "FREET4", "T3FREE", "THYROIDAB" in the last 72 hours. Anemia Panel: Recent Labs    08/12/23 0457  FERRITIN 217  TIBC 179*  IRON 59   Sepsis Labs: Recent Labs  Lab 08/08/23 1935  LATICACIDVEN 1.4    Recent Results (from the past 240 hour(s))  Blood culture (routine x 2)     Status: Abnormal   Collection Time: 08/08/23  7:34 PM   Specimen: BLOOD  Result Value Ref Range Status   Specimen Description   Final    BLOOD LEFT ANTECUBITAL Performed at [facility], [address] [place], [place] [address]    Special Requests   Final    BOTTLES DRAWN AEROBIC AND ANAEROBIC Blood Culture adequate volume Performed at [facility], [address] [place], [place] [address]    Culture  Setup Time   Final    GRAM NEGATIVE RODS IN BOTH AEROBIC AND ANAEROBIC BOTTLES CRITICAL VALUE NOTED.  VALUE IS CONSISTENT WITH  PREVIOUSLY REPORTED AND CALLED VALUE.    Culture (A)  Final    ESCHERICHIA COLI SUSCEPTIBILITIES PERFORMED ON PREVIOUS CULTURE WITHIN THE LAST 5 DAYS. Performed at [facility], [address], [place], [place] [address]    Report Status 08/11/2023 FINAL  Final  Blood culture (routine x 2)     Status: Abnormal   Collection Time: 08/08/23  7:34 PM   Specimen: BLOOD  Result Value Ref Range Status   Specimen Description   Final    BLOOD RIGHT ANTECUBITAL Performed at [facility], [address] [place], [place] [address]    Special Requests   Final    BOTTLES DRAWN AEROBIC AND ANAEROBIC Blood Culture adequate volume Performed at [facility], [address], [place], [place] [address]    Culture  Setup Time   Final    GRAM NEGATIVE RODS IN BOTH AEROBIC AND ANAEROBIC BOTTLES CRITICAL RESULT CALLED TO, READ BACK BY AND VERIFIED WITH: C MAS AT [facility] [id number] AT 933 AM BY CM CRITICAL RESULT CALLED TO, READ BACK BY AND VERIFIED WITH: PHARMD A PHAM [id number] AT 939 AM BY CM Performed at [facility], [address] [place], [place] [address]    Culture ESCHERICHIA COLI (A)  Final   Report Status 08/11/2023 FINAL  Final   Organism ID, Bacteria ESCHERICHIA COLI  Final   Organism ID, Bacteria ESCHERICHIA COLI  Final      Susceptibility   Escherichia coli - KIRBY BAUER*    CEFAZOLIN SENSITIVE Sensitive    Escherichia coli - MIC*    AMPICILLIN <=2 SENSITIVE Sensitive     CEFEPIME <=0.12 SENSITIVE Sensitive     CEFTAZIDIME <=1 SENSITIVE Sensitive     CEFTRIAXONE <=0.25 SENSITIVE Sensitive     CIPROFLOXACIN <=0.25 SENSITIVE Sensitive     GENTAMICIN <=1 SENSITIVE Sensitive     IMIPENEM <=0.25 SENSITIVE Sensitive     TRIMETH/SULFA <=20 SENSITIVE Sensitive     AMPICILLIN/SULBACTAM <=2 SENSITIVE Sensitive     PIP/TAZO <=4 SENSITIVE Sensitive ug/mL    * ESCHERICHIA COLI    ESCHERICHIA COLI  Blood Culture ID Panel (Reflexed)     Status:  Abnormal   Collection Time: 08/08/23  7:34 PM  Result Value Ref Range Status   Enterococcus faecalis NOT DETECTED NOT DETECTED Final   Enterococcus Faecium NOT DETECTED NOT DETECTED Final   Listeria monocytogenes NOT DETECTED NOT DETECTED Final   Staphylococcus species NOT DETECTED NOT DETECTED Final   Staphylococcus aureus (BCID) NOT DETECTED NOT DETECTED Final   Staphylococcus epidermidis NOT DETECTED NOT DETECTED Final   Staphylococcus lugdunensis NOT DETECTED NOT DETECTED Final   Streptococcus species NOT DETECTED NOT DETECTED Final   Streptococcus agalactiae NOT DETECTED NOT DETECTED Final   Streptococcus pneumoniae NOT DETECTED NOT DETECTED Final   Streptococcus pyogenes NOT DETECTED NOT DETECTED Final   A.calcoaceticus-baumannii NOT DETECTED NOT DETECTED Final   Bacteroides fragilis NOT DETECTED NOT DETECTED Final   Enterobacterales DETECTED (A) NOT DETECTED Final    Comment: Enterobacterales represent a large order of gram negative bacteria, not a single organism. CRITICAL RESULT CALLED TO, READ BACK BY AND VERIFIED WITH: ED AT [facility] C MAS [id number] AT 933 AM BY CM    Enterobacter cloacae complex NOT DETECTED NOT DETECTED Final   Escherichia coli DETECTED (A) NOT DETECTED Final    Comment: CRITICAL RESULT CALLED TO, READ BACK BY AND VERIFIED WITH:  ED AT [facility] C MAS [id number] AT 933 AM BY CM    Klebsiella aerogenes NOT DETECTED NOT DETECTED Final   Klebsiella oxytoca NOT DETECTED NOT DETECTED Final   Klebsiella pneumoniae NOT DETECTED NOT DETECTED Final   Proteus  species NOT DETECTED NOT DETECTED Final   Salmonella species NOT DETECTED NOT DETECTED Final   Serratia marcescens NOT DETECTED NOT DETECTED Final   Haemophilus influenzae NOT DETECTED NOT DETECTED Final   Neisseria meningitidis NOT DETECTED NOT DETECTED Final   Pseudomonas aeruginosa NOT DETECTED NOT DETECTED Final   Stenotrophomonas maltophilia NOT DETECTED NOT DETECTED Final   Candida albicans NOT DETECTED NOT  DETECTED Final   Candida auris NOT DETECTED NOT DETECTED Final   Candida glabrata NOT DETECTED NOT DETECTED Final   Candida krusei NOT DETECTED NOT DETECTED Final   Candida parapsilosis NOT DETECTED NOT DETECTED Final   Candida tropicalis NOT DETECTED NOT DETECTED Final   Cryptococcus neoformans/gattii NOT DETECTED NOT DETECTED Final   CTX-M ESBL NOT DETECTED NOT DETECTED Final   Carbapenem resistance IMP NOT DETECTED NOT DETECTED Final   Carbapenem resistance KPC NOT DETECTED NOT DETECTED Final   Carbapenem resistance NDM NOT DETECTED NOT DETECTED Final   Carbapenem resist OXA 48 LIKE NOT DETECTED NOT DETECTED Final   Carbapenem resistance VIM NOT DETECTED NOT DETECTED Final    Comment: Performed at [facility], [address], [place], [place] [address]  Urine Culture     Status: Abnormal   Collection Time: 08/08/23  7:35 PM   Specimen: Urine, Clean Catch  Result Value Ref Range Status   Specimen Description   Final    URINE, CLEAN CATCH Performed at [facility], [address] [place], [place] [address]    Special Requests   Final    NONE Performed at [facility], [address], [place], [place] [address]    Culture (A)  Final    <10,000 COLONIES/mL INSIGNIFICANT GROWTH Performed at [facility], [address], [place], [place] [address]    Report Status 08/10/2023 FINAL  Final     Radiology Studies: NM Pulmonary Perf and Vent  Result Date: 08/11/2023 CLINICAL DATA:  Elevated D-dimer. Short of breath. Low saturations. Concern for pulmonary embolism. EXAM: NUCLEAR MEDICINE PERFUSION LUNG SCAN TECHNIQUE: Perfusion images were obtained in multiple projections after intravenous injection of radiopharmaceutical. Ventilation scans intentionally deferred if perfusion scan and chest x-ray adequate for interpretation during COVID 19 epidemic. RADIOPHARMACEUTICALS:  4.3 mCi Tc-59m MAA IV COMPARISON:  Chest radiograph 08/08/2023,  CT abdomen 08/10/2023 FINDINGS: No wedge-shaped peripheral perfusion defects in the LEFT or RIGHT lung to suggest acute pulmonary embolism. IMPRESSION: No evidence acute pulmonary embolism. Electronically Signed   By: Genevive Bi M.D.   On: 08/11/2023 10:25   CT ABDOMEN PELVIS WO CONTRAST  Result Date: 08/10/2023 CLINICAL DATA:  Right lower quadrant abdominal pain. EXAM: CT ABDOMEN AND PELVIS WITHOUT CONTRAST TECHNIQUE: Multidetector CT imaging of the abdomen and pelvis was performed following the standard protocol without IV contrast. RADIATION DOSE REDUCTION: This exam was performed according to the departmental dose-optimization program which includes automated exposure control, adjustment of the mA and/or kV according to patient size and/or use of iterative reconstruction technique. COMPARISON:  August 08, 2023. FINDINGS: Lower chest: Small pleural effusions are noted with adjacent subsegmental atelectasis or infiltrates. Hepatobiliary: Hepatic steatosis. No cholelithiasis or biliary dilatation. Pancreas: Unremarkable. No pancreatic ductal dilatation or surrounding inflammatory changes. Spleen: Normal in size without focal abnormality. Adrenals/Urinary Tract: Adrenal glands appear normal. Bilateral nonobstructive nephrolithiasis is noted. No definite hydronephrosis or renal obstruction is noted. Urinary bladder is unremarkable. Stomach/Bowel: Stomach is within normal limits. Appendix appears normal. No evidence of bowel wall thickening, distention, or inflammatory changes. Vascular/Lymphatic: No  significant vascular findings are present. No enlarged abdominal or pelvic lymph nodes. Reproductive: Uterus and bilateral adnexa are unremarkable. Other: Small amount of free fluid is noted in the pelvis which may be physiologic. No hernia is noted. Musculoskeletal: No acute or significant osseous findings. IMPRESSION: Small pleural effusions are noted with adjacent subsegmental atelectasis or infiltrates.  Bilateral nonobstructive nephrolithiasis. Hepatic steatosis. Small amount of free fluid is noted in the pelvis which may be physiologic. Electronically Signed   By: Lupita Raider M.D.   On: 08/10/2023 15:20    Scheduled Meds:  influenza vac split trivalent PF  0.5 mL Intramuscular Tomorrow-1000   lidocaine  2 patch Transdermal Daily   methocarbamol  500 mg Oral TID   pantoprazole (PROTONIX) IV  40 mg Intravenous Q24H   sodium bicarbonate  650 mg Oral TID   vortioxetine HBr  20 mg Oral Daily   Continuous Infusions:  ampicillin-sulbactam (UNASYN) IV 3 g (08/12/23 0719)   lactated ringers 75 mL/hr at 08/12/23 0512     LOS: 3 days   Hughie Closs, MD [facility]  08/12/2023, 12:35 PM   *Please note that this is a verbal dictation therefore any spelling or grammatical errors are due to the "Dragon [employer] One" system interpretation.  Please page via Amion and do not message via secure chat for urgent patient care matters. Secure chat can be used for non urgent patient care matters.  How to contact the [facility] Attending or Consulting provider 7A - 7P or covering provider during after hours 7P -7A, for this patient?  Check the care team in [facility] and look for a) attending/consulting [facility] provider listed and b) the [facility] team listed. Page or secure chat 7A-7P. Log into [email or web address] and use [facility] universal password to access. If you do not have the password, please contact the hospital operator. Locate the [facility] provider you are looking for under [facility] and page to a number that you can be directly reached. If you still have difficulty reaching the provider, please page the [facility] (Director on Call) for the Hospitalists listed on amion for assistance.

## 2023-08-12 NOTE — Progress Notes (Signed)
[facility] NEPHROLOGY PROGRESS NOTE  Assessment/ Plan: Pt is a 27 y.o. yo female with past medical history significant for anxiety depression, ADHD who was admitted for sepsis due to E. coli bacteremia and pyelonephritis seen as a consultation for the evaluation of acute kidney injury.  # Acute kidney injury, nonoliguric thought to be due to prerenal insult and NSAID use in the setting of sepsis and pyelonephritis.  Abnormal urinalysis probably because of nephrolithiasis and pyelonephritis. Receiving IV fluid with improvement of serum creatinine level.  I recommend to DC IV fluid when she has good oral intake.  Please dose medication renally.  Avoid NSAIDs, hypotensive episode.  No need for dialysis. I have emphasized to the patient that she will need close follow-up at [facility] after discharge.  # Sepsis due to E. coli bacteremia, acute pyelonephritis.  Currently on Unasyn per primary team.  # Metabolic acidosis: Started sodium bicarbonate orally.  Follow serum CO2 level.  # Anemia: Iron saturation 33.  Continue to monitor.  I will sign off, please call us back with question. I will send message to the office at [facility] to arrange a hospital discharge follow-up for the patient.  Subjective: Seen and examined at bedside.  Patient reports generalized body pain.  Urine output is recorded 3.7 L.  No chest pain or shortness of breath. Objective Vital signs in last 24 hours: Vitals:   08/11/23 1250 08/11/23 2032 08/12/23 0500 08/12/23 0602  BP: (!) 138/97 (!) 149/93  (!) 149/94  Pulse: 89 97  86  Resp: 16 17  17   Temp: 98.4 F (36.9 C) 98.5 F (36.9 C)  98 F (36.7 C)  TempSrc: Oral Oral  Oral  SpO2: 94% 99%  99%  Weight:   86.7 kg   Height:       Weight change: 7.9 kg  Intake/Output Summary (Last 24 hours) at 08/12/2023 0853 Last data filed at 08/12/2023 0649 Gross per 24 hour  Intake 3925.98 ml  Output 3750 ml  Net 175.98 ml        Labs: RENAL PANEL Recent Labs  Lab 08/08/23 1735 08/09/23 0444 08/09/23 2027 08/10/23 0720 08/11/23 0514 08/12/23 0457  NA 132* 132* 132* 135 132* 130*  K 3.8 3.2* 4.0 3.8 3.9 4.4  CL 99 108 105 108 109 106  CO2 21* 20* 15* 16* 14* 15*  GLUCOSE 139* 111* 67* 59* 75 80  BUN 32* 33* 34* 41* 38* 34*  CREATININE 2.25* 2.33* 2.68* 2.82* 2.80* 2.47*  CALCIUM 8.5* 7.2* 7.7* 7.6* 7.6* 7.2*  MG  --  2.3  --  2.4  --   --   PHOS  --  2.5  --   --   --   --   ALBUMIN 3.2* 2.1*  --  1.9* 1.8* 1.6*    Liver Function Tests: Recent Labs  Lab 08/10/23 0720 08/11/23 0514 08/12/23 0457  AST 20 15 19   ALT 48* 34 26  ALKPHOS 100 119 124  BILITOT 1.4* 1.2* 1.6*  PROT 4.9* 4.7* 4.4*  ALBUMIN 1.9* 1.8* 1.6*   Recent Labs  Lab 08/08/23 1735  LIPASE <10*   No results for input(s): "AMMONIA" in the last 168 hours. CBC: Recent Labs    08/08/23 1735 08/09/23 0444 08/10/23 0720 08/11/23 0514 08/12/23 0457 08/12/23 0642  HGB 11.8* 10.8* 10.3* 9.1*  --  9.3*  MCV 84.1 89.0 87.6 87.7  --  88.4  FERRITIN  --   --   --   --  217  --   TIBC  --   --   --   --  179*  --   IRON  --   --   --   --  59  --     Cardiac Enzymes: No results for input(s): "CKTOTAL", "CKMB", "CKMBINDEX", "TROPONINI" in the last 168 hours. CBG: No results for input(s): "GLUCAP" in the last 168 hours.  Iron Studies:  Recent Labs    08/12/23 0457  IRON 59  TIBC 179*  FERRITIN 217   Studies/Results: NM Pulmonary Perf and Vent  Result Date: 08/11/2023 CLINICAL DATA:  Elevated D-dimer. Short of breath. Low saturations. Concern for pulmonary embolism. EXAM: NUCLEAR MEDICINE PERFUSION LUNG SCAN TECHNIQUE: Perfusion images were obtained in multiple projections after intravenous injection of radiopharmaceutical. Ventilation scans intentionally deferred if perfusion scan and chest x-ray adequate for interpretation during COVID 19 epidemic. RADIOPHARMACEUTICALS:  4.3 mCi Tc-67m MAA IV COMPARISON:  Chest  radiograph 08/08/2023, CT abdomen 08/10/2023 FINDINGS: No wedge-shaped peripheral perfusion defects in the LEFT or RIGHT lung to suggest acute pulmonary embolism. IMPRESSION: No evidence acute pulmonary embolism. Electronically Signed   By: Genevive Bi M.D.   On: 08/11/2023 10:25   CT ABDOMEN PELVIS WO CONTRAST  Result Date: 08/10/2023 CLINICAL DATA:  Right lower quadrant abdominal pain. EXAM: CT ABDOMEN AND PELVIS WITHOUT CONTRAST TECHNIQUE: Multidetector CT imaging of the abdomen and pelvis was performed following the standard protocol without IV contrast. RADIATION DOSE REDUCTION: This exam was performed according to the departmental dose-optimization program which includes automated exposure control, adjustment of the mA and/or kV according to patient size and/or use of iterative reconstruction technique. COMPARISON:  August 08, 2023. FINDINGS: Lower chest: Small pleural effusions are noted with adjacent subsegmental atelectasis or infiltrates. Hepatobiliary: Hepatic steatosis. No cholelithiasis or biliary dilatation. Pancreas: Unremarkable. No pancreatic ductal dilatation or surrounding inflammatory changes. Spleen: Normal in size without focal abnormality. Adrenals/Urinary Tract: Adrenal glands appear normal. Bilateral nonobstructive nephrolithiasis is noted. No definite hydronephrosis or renal obstruction is noted. Urinary bladder is unremarkable. Stomach/Bowel: Stomach is within normal limits. Appendix appears normal. No evidence of bowel wall thickening, distention, or inflammatory changes. Vascular/Lymphatic: No significant vascular findings are present. No enlarged abdominal or pelvic lymph nodes. Reproductive: Uterus and bilateral adnexa are unremarkable. Other: Small amount of free fluid is noted in the pelvis which may be physiologic. No hernia is noted. Musculoskeletal: No acute or significant osseous findings. IMPRESSION: Small pleural effusions are noted with adjacent subsegmental  atelectasis or infiltrates. Bilateral nonobstructive nephrolithiasis. Hepatic steatosis. Small amount of free fluid is noted in the pelvis which may be physiologic. Electronically Signed   By: Lupita Raider M.D.   On: 08/10/2023 15:20   NM Hepatobiliary Liver Func  Result Date: 08/10/2023 CLINICAL DATA:  Right upper quadrant abdominal pain. Evaluate for acute cholecystitis. EXAM: NUCLEAR MEDICINE HEPATOBILIARY IMAGING TECHNIQUE: Sequential images of the abdomen were obtained out to 60 minutes following intravenous administration of radiopharmaceutical. RADIOPHARMACEUTICALS:  5.4 mCi Tc-16m  Choletec IV COMPARISON:  CT abdomen and pelvis-08/08/2019; right upper quadrant abdominal pain-08/08/2023 FINDINGS: There is homogeneous distribution of injected radiotracer throughout the hepatic parenchyma. There is early excretion of radiotracer with opacification of the gallbladder, initially seen on the 10 minute anterior projection of the image. There is early excretion of radiotracer with opacification of the proximal small bowel, initially seen on the 30 minute anterior projection planar image. IMPRESSION: No scintigraphic evidence of cystic duct obstruction/acute cholecystitis. Electronically Signed   By: Holland Commons.D.  On: 08/10/2023 10:36    Medications: Infusions:  sodium chloride Stopped (08/12/23 0512)   ampicillin-sulbactam (UNASYN) IV 3 g (08/12/23 0719)   lactated ringers 75 mL/hr at 08/12/23 2440    Scheduled Medications:  influenza vac split trivalent PF  0.5 mL Intramuscular Tomorrow-1000   lidocaine  2 patch Transdermal Daily   methocarbamol  500 mg Oral TID   pantoprazole (PROTONIX) IV  40 mg Intravenous Q24H   sodium bicarbonate  650 mg Oral TID   vortioxetine HBr  20 mg Oral Daily    have reviewed scheduled and prn medications.  Physical Exam: General: Anxious looking female, able to lie flat, not in distress Heart:RRR, s1s2 nl Lungs:clear b/l, no crackle Abdomen:soft,   non-distended Extremities:No edema Neurology: Anxious, alert awake and following commands.  Sundi Slevin Prasad Andrik Sandt 08/12/2023,8:53 AM  LOS: 3 days

## 2023-08-12 NOTE — Plan of Care (Signed)
  Problem: Education: Goal: Knowledge of General Education information will improve Description: Including pain rating scale, medication(s)/side effects and non-pharmacologic comfort measures Outcome: Progressing   Problem: Nutrition: Goal: Adequate nutrition will be maintained Outcome: Progressing   

## 2023-08-13 ENCOUNTER — Encounter (HOSPITAL_COMMUNITY): Payer: Self-pay | Admitting: Family Medicine

## 2023-08-13 DIAGNOSIS — N12 Tubulo-interstitial nephritis, not specified as acute or chronic: Secondary | ICD-10-CM | POA: Diagnosis not present

## 2023-08-13 LAB — BASIC METABOLIC PANEL
Anion gap: 8 (ref 5–15)
BUN: 28 mg/dL — ABNORMAL HIGH (ref 6–20)
CO2: 18 mmol/L — ABNORMAL LOW (ref 22–32)
Calcium: 7.7 mg/dL — ABNORMAL LOW (ref 8.9–10.3)
Chloride: 107 mmol/L (ref 98–111)
Creatinine, Ser: 2 mg/dL — ABNORMAL HIGH (ref 0.44–1.00)
GFR, Estimated: 34 mL/min — ABNORMAL LOW (ref >60)
Glucose, Bld: 104 mg/dL — ABNORMAL HIGH (ref 70–99)
Potassium: 3.6 mmol/L (ref 3.5–5.1)
Sodium: 133 mmol/L — ABNORMAL LOW (ref 135–145)

## 2023-08-13 MED ORDER — POLYETHYLENE GLYCOL 3350 17 G PO PACK
17.0000 g | PACK | Freq: Two times a day (BID) | ORAL | Status: DC
  Administered 2023-08-13 (×2): 17 g via ORAL
  Filled 2023-08-13 (×3): qty 1

## 2023-08-13 MED ORDER — SODIUM CHLORIDE 0.9 % IV SOLN: INTRAVENOUS | Status: DC

## 2023-08-13 MED ORDER — DOCUSATE SODIUM 100 MG PO CAPS
100.0000 mg | ORAL_CAPSULE | Freq: Two times a day (BID) | ORAL | Status: DC
  Administered 2023-08-13 (×2): 100 mg via ORAL
  Filled 2023-08-13 (×3): qty 1

## 2023-08-13 MED ORDER — LACTATED RINGERS IV SOLN: INTRAVENOUS | Status: DC

## 2023-08-13 MED ORDER — OXYCODONE HCL 5 MG PO TABS
5.0000 mg | ORAL_TABLET | Freq: Three times a day (TID) | ORAL | Status: DC | PRN
  Administered 2023-08-13 – 2023-08-14 (×3): 5 mg via ORAL
  Filled 2023-08-13 (×3): qty 1

## 2023-08-13 MED ORDER — BISACODYL 10 MG RE SUPP
10.0000 mg | Freq: Once | RECTAL | Status: AC
  Administered 2023-08-13: 10 mg via RECTAL
  Filled 2023-08-13: qty 1

## 2023-08-13 NOTE — Plan of Care (Signed)

## 2023-08-13 NOTE — Plan of Care (Signed)

## 2023-08-13 NOTE — Progress Notes (Signed)
PROGRESS NOTE    Renee Mcdowell  WUJ:[id number] DOB: 05-07-1996 DOA: 08/08/2023 PCP: [name], MD   Brief Narrative:  PMH of ADHD, anxiety, depression presented to the hospital with complaints of right-sided back pain, nausea and vomiting. 11/25 seen in the ED with complaints of right sided mid back pain..  Thought to be trapezius muscle straining secondary to overexertion.  (She is currently in the process of moving).  Was discharged with Flexeril and prednisone. 11/29 seen again for the same pain now worsening.  Discharged back home. After going home started to have multiple vomiting episode with worsening pain. 12/2 present again to ED this time with pain involving right upper abdomen area especially after eating.  Was actually seen by PCP who ordered her to go to ED and recommended CT abdomen. Currently being treated for E. coli sepsis, AKI, suspected etiology is pyonephritis versus cholecystitis.  General surgery following.  Assessment & Plan:   Principal Problem:   Pyelonephritis of right kidney Active Problems:   Depression with anxiety   AKI (acute kidney injury) (HCC)   Thrombocytopenia (HCC)  Severe sepsis secondary to right-sided pyelonephritis and E. coli bacteremia: She states that her pain initially started at right shoulder, then periumbilical which radiated to the right lower quadrant and then right flank.  No significant CVA tenderness on my examination.  UA consistent with UTI and CT renal stone study suggesting possible acute pyelonephritis and small 2/3 mm nonobstructing right renal calculi.  Patient was started on Rocephin, blood culture growing pansensitive E. coli but urine culture grew insignificant growth.  Ruled out of appendicitis, cholecystitis and PE.  We are tapering her opioid medications and pain is fairly controlled.  Will further space out her oxycodone to every 8 hours as needed.  Advance diet to regular diet.  She feels comfortable discharging home  tomorrow.  Acute kidney injury/metabolic acidosis. Baseline serum creatinine normal 0.7 based on the labs available 2 years ago however she presented with creatinine of 2.25 which worsened and had plateaued around 2.8 since last 3 days.  CT abdomen completed 08/10/2023 ruled out renal obstruction and there is no hydronephrosis.  Nephrology consulted, evaluated by them and they opined that her AKI is likely secondary to NSAID utilization due to pain.  They recommended continuing LR.  Patient's creatinine has started to gradually improve and is down to 2.0 today.  Nephrology has signed off and has arranged outpatient follow-up for her.  Will resume Ringer's lactate for another 20 hours and repeat labs in the morning.  Acidosis improving.   Elevated LFT. Bilirubin 1.7, AST normal, ALT mildly elevated. Currently parameters are improving.  Monitor values daily.   Acute thrombocytopenia. Platelet count at baseline around 300.  Thrombocytopenia likely secondary to severe sepsis which is improving.  No signs of bleeding.  Anemia-dilutional. Hemoglobin stable.   Hypoxia: Resolved patient saturating 99% on room air.  PE ruled out.  Depression. Will continue home regimen.   ADHD. Medications are currently on hold.  Constipation: MiraLAX, Colace and Dulcolax suppository.  Advised to be more mobile.  DVT prophylaxis: Place and maintain sequential compression device Start: 08/09/23 0850   Code Status: Full Code  Family Communication: Mother present at bedside.  Plan of care discussed with patient in length and he/she verbalized understanding and agreed with it.  Status is: Inpatient Remains inpatient appropriate because: Pain is improving, creatinine improving.  She feels comfortable going home tomorrow.   Estimated body mass index is 30.85 kg/m as calculated  from the following:   Height as of this encounter: 5\' 6"  (1.676 m).   Weight as of this encounter: 86.7 kg.    Nutritional  Assessment: Body mass index is 30.85 kg/m.Marland Kitchen Seen by dietician.  I agree with the assessment and plan as outlined below: Nutrition Status:        . Skin Assessment: I have examined the patient's skin and I agree with the wound assessment as performed by the wound care RN as outlined below:    Consultants:  General surgery  Procedures:  As above  Antimicrobials:  Anti-infectives (From admission, onward)    Start     Dose/Rate Route Frequency Ordered Stop   08/11/23 2000  Ampicillin-Sulbactam (UNASYN) 3 g in sodium chloride 0.9 % 100 mL IVPB        3 g 200 mL/hr over 30 Minutes Intravenous Every 6 hours 08/11/23 1316     08/09/23 2000  cefTRIAXone (ROCEPHIN) 2 g in sodium chloride 0.9 % 100 mL IVPB  Status:  Discontinued        2 g 200 mL/hr over 30 Minutes Intravenous Every 24 hours 08/09/23 0145 08/11/23 1316   08/09/23 1100  metroNIDAZOLE (FLAGYL) IVPB 500 mg  Status:  Discontinued        500 mg 100 mL/hr over 60 Minutes Intravenous Every 12 hours 08/09/23 0252 08/11/23 1316   08/08/23 2300  metroNIDAZOLE (FLAGYL) IVPB 500 mg        500 mg 100 mL/hr over 60 Minutes Intravenous  Once 08/08/23 2256 08/09/23 0024   08/08/23 1915  cefTRIAXone (ROCEPHIN) 2 g in sodium chloride 0.9 % 100 mL IVPB        2 g 200 mL/hr over 30 Minutes Intravenous  Once 08/08/23 1903 08/08/23 2025         Subjective: Patient seen and examined.  Pain only 4 out of 10, mother at the bedside.  Patient has no other complaint.  Tolerating soft diet, advancing to regular diet.  Objective: Vitals:   08/12/23 0500 08/12/23 0602 08/12/23 1334 08/12/23 2118  BP:  (!) 149/94 (!) 152/88 (!) 142/83  Pulse:  86 89 95  Resp:  17 18 18   Temp:  98 F (36.7 C) 98.4 F (36.9 C) 98.8 F (37.1 C)  TempSrc:  Oral Oral Oral  SpO2:  99% 100% 100%  Weight: 86.7 kg     Height:        Intake/Output Summary (Last 24 hours) at 08/13/2023 1119 Last data filed at 08/13/2023 0657 Gross per 24 hour  Intake  1466.64 ml  Output 3300 ml  Net -1833.36 ml   Filed Weights   08/09/23 0500 08/11/23 0500 08/12/23 0500  Weight: 79 kg 78.8 kg 86.7 kg    Examination:  General exam: Appears calm and comfortable  Respiratory system: Clear to auscultation. Respiratory effort normal. Cardiovascular system: S1 & S2 heard, RRR. No JVD, murmurs, rubs, gallops or clicks. No pedal edema. Gastrointestinal system: Abdomen is nondistended, soft and nontender. No organomegaly or masses felt. Normal bowel sounds heard. Central nervous system: Alert and oriented. No focal neurological deficits. Extremities: Symmetric 5 x 5 power. Skin: No rashes, lesions or ulcers.  Psychiatry: Judgement and insight appear normal. Mood & affect appropriate.   Data Reviewed: I have personally reviewed following labs and imaging studies  CBC: Recent Labs  Lab 08/08/23 1735 08/09/23 0444 08/10/23 0720 08/11/23 0514 08/12/23 0642  WBC 19.0* 15.6* 17.1* 15.9* 14.0*  NEUTROABS  --   --  15.1* 13.3*  --   HGB 11.8* 10.8* 10.3* 9.1* 9.3*  HCT 34.8* 33.1* 30.5* 28.6* 29.0*  MCV 84.1 89.0 87.6 87.7 88.4  PLT 127* 98* 80* 101* 134*   Basic Metabolic Panel: Recent Labs  Lab 08/09/23 0444 08/09/23 2027 08/10/23 0720 08/11/23 0514 08/12/23 0457 08/13/23 0453  NA 132* 132* 135 132* 130* 133*  K 3.2* 4.0 3.8 3.9 4.4 3.6  CL 108 105 108 109 106 107  CO2 20* 15* 16* 14* 15* 18*  GLUCOSE 111* 67* 59* 75 80 104*  BUN 33* 34* 41* 38* 34* 28*  CREATININE 2.33* 2.68* 2.82* 2.80* 2.47* 2.00*  CALCIUM 7.2* 7.7* 7.6* 7.6* 7.2* 7.7*  MG 2.3  --  2.4  --   --   --   PHOS 2.5  --   --   --   --   --    GFR: Estimated Creatinine Clearance: 46.1 mL/min (A) (by C-G formula based on SCr of 2 mg/dL (H)). Liver Function Tests: Recent Labs  Lab 08/08/23 1735 08/09/23 0444 08/10/23 0720 08/11/23 0514 08/12/23 0457  AST 32 28 20 15 19   ALT 92* 70* 48* 34 26  ALKPHOS 132* 128* 100 119 124  BILITOT 1.7* 1.2* 1.4* 1.2* 1.6*  PROT  5.9* 5.2* 4.9* 4.7* 4.4*  ALBUMIN 3.2* 2.1* 1.9* 1.8* 1.6*   Recent Labs  Lab 08/08/23 1735  LIPASE <10*   No results for input(s): "AMMONIA" in the last 168 hours. Coagulation Profile: No results for input(s): "INR", "PROTIME" in the last 168 hours. Cardiac Enzymes: No results for input(s): "CKTOTAL", "CKMB", "CKMBINDEX", "TROPONINI" in the last 168 hours. BNP (last 3 results) No results for input(s): "PROBNP" in the last 8760 hours. HbA1C: No results for input(s): "HGBA1C" in the last 72 hours. CBG: No results for input(s): "GLUCAP" in the last 168 hours. Lipid Profile: No results for input(s): "CHOL", "HDL", "LDLCALC", "TRIG", "CHOLHDL", "LDLDIRECT" in the last 72 hours. Thyroid Function Tests: No results for input(s): "TSH", "T4TOTAL", "FREET4", "T3FREE", "THYROIDAB" in the last 72 hours. Anemia Panel: Recent Labs    08/12/23 0457  FERRITIN 217  TIBC 179*  IRON 59   Sepsis Labs: Recent Labs  Lab 08/08/23 1935  LATICACIDVEN 1.4    Recent Results (from the past 240 hour(s))  Blood culture (routine x 2)     Status: Abnormal   Collection Time: 08/08/23  7:34 PM   Specimen: BLOOD  Result Value Ref Range Status   Specimen Description   Final    BLOOD LEFT ANTECUBITAL Performed at [facility], [address] [place], [place] [address]    Special Requests   Final    BOTTLES DRAWN AEROBIC AND ANAEROBIC Blood Culture adequate volume Performed at [facility], [address] [place], [place] [address]    Culture  Setup Time   Final    GRAM NEGATIVE RODS IN BOTH AEROBIC AND ANAEROBIC BOTTLES CRITICAL VALUE NOTED.  VALUE IS CONSISTENT WITH PREVIOUSLY REPORTED AND CALLED VALUE.    Culture (A)  Final    ESCHERICHIA COLI SUSCEPTIBILITIES PERFORMED ON PREVIOUS CULTURE WITHIN THE LAST 5 DAYS. Performed at [facility], [address], [place], [place] [address]    Report Status 08/11/2023 FINAL  Final  Blood culture  (routine x 2)     Status: Abnormal   Collection Time: 08/08/23  7:34 PM   Specimen: BLOOD  Result Value Ref Range Status   Specimen Description   Final  BLOOD RIGHT ANTECUBITAL Performed at [facility], [address], [place], [place] [address]    Special Requests   Final    BOTTLES DRAWN AEROBIC AND ANAEROBIC Blood Culture adequate volume Performed at [facility], [address], [place], [place] [address]    Culture  Setup Time   Final    GRAM NEGATIVE RODS IN BOTH AEROBIC AND ANAEROBIC BOTTLES CRITICAL RESULT CALLED TO, READ BACK BY AND VERIFIED WITH: C MAS AT [facility] [id number] AT 933 AM BY CM CRITICAL RESULT CALLED TO, READ BACK BY AND VERIFIED WITH: PHARMD A PHAM [id number] AT 939 AM BY CM Performed at [facility], [address], [place], [place] [address]    Culture ESCHERICHIA COLI (A)  Final   Report Status 08/11/2023 FINAL  Final   Organism ID, Bacteria ESCHERICHIA COLI  Final   Organism ID, Bacteria ESCHERICHIA COLI  Final      Susceptibility   Escherichia coli - KIRBY BAUER*    CEFAZOLIN SENSITIVE Sensitive    Escherichia coli - MIC*    AMPICILLIN <=2 SENSITIVE Sensitive     CEFEPIME <=0.12 SENSITIVE Sensitive     CEFTAZIDIME <=1 SENSITIVE Sensitive     CEFTRIAXONE <=0.25 SENSITIVE Sensitive     CIPROFLOXACIN <=0.25 SENSITIVE Sensitive     GENTAMICIN <=1 SENSITIVE Sensitive     IMIPENEM <=0.25 SENSITIVE Sensitive     TRIMETH/SULFA <=20 SENSITIVE Sensitive     AMPICILLIN/SULBACTAM <=2 SENSITIVE Sensitive     PIP/TAZO <=4 SENSITIVE Sensitive ug/mL    * ESCHERICHIA COLI    ESCHERICHIA COLI  Blood Culture ID Panel (Reflexed)     Status: Abnormal   Collection Time: 08/08/23  7:34 PM  Result Value Ref Range Status   Enterococcus faecalis NOT DETECTED NOT DETECTED Final   Enterococcus Faecium NOT DETECTED NOT DETECTED Final   Listeria monocytogenes NOT DETECTED NOT DETECTED Final   Staphylococcus species NOT DETECTED NOT  DETECTED Final   Staphylococcus aureus (BCID) NOT DETECTED NOT DETECTED Final   Staphylococcus epidermidis NOT DETECTED NOT DETECTED Final   Staphylococcus lugdunensis NOT DETECTED NOT DETECTED Final   Streptococcus species NOT DETECTED NOT DETECTED Final   Streptococcus agalactiae NOT DETECTED NOT DETECTED Final   Streptococcus pneumoniae NOT DETECTED NOT DETECTED Final   Streptococcus pyogenes NOT DETECTED NOT DETECTED Final   A.calcoaceticus-baumannii NOT DETECTED NOT DETECTED Final   Bacteroides fragilis NOT DETECTED NOT DETECTED Final   Enterobacterales DETECTED (A) NOT DETECTED Final    Comment: Enterobacterales represent a large order of gram negative bacteria, not a single organism. CRITICAL RESULT CALLED TO, READ BACK BY AND VERIFIED WITH: ED AT [facility] C MAS [id number] AT 933 AM BY CM    Enterobacter cloacae complex NOT DETECTED NOT DETECTED Final   Escherichia coli DETECTED (A) NOT DETECTED Final    Comment: CRITICAL RESULT CALLED TO, READ BACK BY AND VERIFIED WITH:  ED AT [facility] C MAS [id number] AT 933 AM BY CM    Klebsiella aerogenes NOT DETECTED NOT DETECTED Final   Klebsiella oxytoca NOT DETECTED NOT DETECTED Final   Klebsiella pneumoniae NOT DETECTED NOT DETECTED Final   Proteus species NOT DETECTED NOT DETECTED Final   Salmonella species NOT DETECTED NOT DETECTED Final   Serratia marcescens NOT DETECTED NOT DETECTED Final   Haemophilus influenzae NOT DETECTED NOT DETECTED Final   Neisseria meningitidis NOT DETECTED NOT DETECTED Final   Pseudomonas aeruginosa NOT DETECTED NOT DETECTED Final   Stenotrophomonas maltophilia NOT DETECTED NOT  DETECTED Final   Candida albicans NOT DETECTED NOT DETECTED Final   Candida auris NOT DETECTED NOT DETECTED Final   Candida glabrata NOT DETECTED NOT DETECTED Final   Candida krusei NOT DETECTED NOT DETECTED Final   Candida parapsilosis NOT DETECTED NOT DETECTED Final   Candida tropicalis NOT DETECTED NOT DETECTED Final   Cryptococcus  neoformans/gattii NOT DETECTED NOT DETECTED Final   CTX-M ESBL NOT DETECTED NOT DETECTED Final   Carbapenem resistance IMP NOT DETECTED NOT DETECTED Final   Carbapenem resistance KPC NOT DETECTED NOT DETECTED Final   Carbapenem resistance NDM NOT DETECTED NOT DETECTED Final   Carbapenem resist OXA 48 LIKE NOT DETECTED NOT DETECTED Final   Carbapenem resistance VIM NOT DETECTED NOT DETECTED Final    Comment: Performed at [facility], [address], [place], [place] [address]  Urine Culture     Status: Abnormal   Collection Time: 08/08/23  7:35 PM   Specimen: Urine, Clean Catch  Result Value Ref Range Status   Specimen Description   Final    URINE, CLEAN CATCH Performed at [facility], [address] [place], [place] [address]    Special Requests   Final    NONE Performed at [facility], [address], [place], [place] [address]    Culture (A)  Final    <10,000 COLONIES/mL INSIGNIFICANT GROWTH Performed at [facility], [address] [place], [place] [address]    Report Status 08/10/2023 FINAL  Final     Radiology Studies: No results found.  Scheduled Meds:  bisacodyl  10 mg Rectal Once   docusate sodium  100 mg Oral BID   lidocaine  2 patch Transdermal Daily   methocarbamol  500 mg Oral TID   pantoprazole  40 mg Oral QHS   polyethylene glycol  17 g Oral BID   sodium bicarbonate  650 mg Oral TID   vortioxetine HBr  20 mg Oral Daily   Continuous Infusions:  ampicillin-sulbactam (UNASYN) IV 3 g (08/13/23 0737)   lactated ringers       LOS: 4 days   Hughie Closs, MD [facility]  08/13/2023, 11:19 AM   *Please note that this is a verbal dictation therefore any spelling or grammatical errors are due to the "[employer] One" system interpretation.  Please page via Amion and do not message via secure chat for urgent patient care matters. Secure chat can be used for non urgent patient care matters.  How  to contact the [facility] Attending or Consulting provider 7A - 7P or covering provider during after hours 7P -7A, for this patient?  Check the care team in [facility] and look for a) attending/consulting [facility] provider listed and b) the [facility] team listed. Page or secure chat 7A-7P. Log into [email or web address] and use [facility] universal password to access. If you do not have the password, please contact the hospital operator. Locate the [facility] provider you are looking for under [facility] and page to a number that you can be directly reached. If you still have difficulty reaching the provider, please page the [facility] (Director on Call) for the Hospitalists listed on amion for assistance.

## 2023-08-13 NOTE — Progress Notes (Signed)
Pt up walking w a friend in the halls, then walked over to the [place] side elevators and attempted to leave the floor w her friend. Noticed pt pushing the elevator down button and pt stopped from leaving the floor. Instructed pt that she is not allowed to leave the floor, she verbalized understanding and returned to her room. MD notified

## 2023-08-14 ENCOUNTER — Inpatient Hospital Stay (HOSPITAL_COMMUNITY): Payer: BC Managed Care – PPO

## 2023-08-14 DIAGNOSIS — R6520 Severe sepsis without septic shock: Secondary | ICD-10-CM | POA: Insufficient documentation

## 2023-08-14 DIAGNOSIS — E872 Acidosis, unspecified: Secondary | ICD-10-CM | POA: Insufficient documentation

## 2023-08-14 DIAGNOSIS — R7881 Bacteremia: Secondary | ICD-10-CM | POA: Insufficient documentation

## 2023-08-14 DIAGNOSIS — A419 Sepsis, unspecified organism: Secondary | ICD-10-CM | POA: Insufficient documentation

## 2023-08-14 DIAGNOSIS — N12 Tubulo-interstitial nephritis, not specified as acute or chronic: Secondary | ICD-10-CM | POA: Diagnosis not present

## 2023-08-14 LAB — CBC WITH DIFFERENTIAL/PLATELET
Abs Immature Granulocytes: 0.17 10*3/uL — ABNORMAL HIGH (ref 0.00–0.07)
Basophils Absolute: 0 10*3/uL (ref 0.0–0.1)
Basophils Relative: 0 %
Eosinophils Absolute: 0.3 10*3/uL (ref 0.0–0.5)
Eosinophils Relative: 2 %
HCT: 26.2 % — ABNORMAL LOW (ref 36.0–46.0)
Hemoglobin: 8.6 g/dL — ABNORMAL LOW (ref 12.0–15.0)
Immature Granulocytes: 1 %
Lymphocytes Relative: 12 %
Lymphs Abs: 1.5 10*3/uL (ref 0.7–4.0)
MCH: 28.6 pg (ref 26.0–34.0)
MCHC: 32.8 g/dL (ref 30.0–36.0)
MCV: 87 fL (ref 80.0–100.0)
Monocytes Absolute: 1.3 10*3/uL — ABNORMAL HIGH (ref 0.1–1.0)
Monocytes Relative: 10 %
Neutro Abs: 9.2 10*3/uL — ABNORMAL HIGH (ref 1.7–7.7)
Neutrophils Relative %: 75 %
Platelets: 359 10*3/uL (ref 150–400)
RBC: 3.01 MIL/uL — ABNORMAL LOW (ref 3.87–5.11)
RDW: 15.5 % (ref 11.5–15.5)
WBC: 12.4 10*3/uL — ABNORMAL HIGH (ref 4.0–10.5)
nRBC: 0 % (ref 0.0–0.2)

## 2023-08-14 LAB — BASIC METABOLIC PANEL
Anion gap: 10 (ref 5–15)
BUN: 19 mg/dL (ref 6–20)
CO2: 21 mmol/L — ABNORMAL LOW (ref 22–32)
Calcium: 7.7 mg/dL — ABNORMAL LOW (ref 8.9–10.3)
Chloride: 106 mmol/L (ref 98–111)
Creatinine, Ser: 1.81 mg/dL — ABNORMAL HIGH (ref 0.44–1.00)
GFR, Estimated: 38 mL/min — ABNORMAL LOW (ref >60)
Glucose, Bld: 90 mg/dL (ref 70–99)
Potassium: 3.7 mmol/L (ref 3.5–5.1)
Sodium: 137 mmol/L (ref 135–145)

## 2023-08-14 MED ORDER — ONDANSETRON 4 MG PO TBDP
4.0000 mg | ORAL_TABLET | Freq: Three times a day (TID) | ORAL | 0 refills | Status: DC | PRN
Start: 2023-08-14 — End: 2023-08-27

## 2023-08-14 MED ORDER — AMOXICILLIN-POT CLAVULANATE 875-125 MG PO TABS: 1.0000 | ORAL_TABLET | Freq: Two times a day (BID) | ORAL | 0 refills | Status: AC

## 2023-08-14 MED ORDER — OXYCODONE HCL 5 MG PO TABS: 5.0000 mg | ORAL_TABLET | Freq: Two times a day (BID) | ORAL | 0 refills | Status: DC | PRN

## 2023-08-14 NOTE — Plan of Care (Signed)

## 2023-08-14 NOTE — Discharge Summary (Signed)
Physician Discharge Summary  Renee Mcdowell ZOX:[id number] DOB: 05/15/1996 DOA: 08/08/2023  PCP: [name], MD  Admit date: 08/08/2023 Discharge date: 08/14/2023 30 Day Unplanned Readmission Risk Score    Flowsheet Row ED to Hosp-Admission (Current) from 08/08/2023 in [facility] General Surgery  30 Day Unplanned Readmission Risk Score (%) 12.81 Filed at 08/14/2023 0801       This score is the patient's risk of an unplanned readmission within 30 days of being discharged (0 -100%). The score is based on dignosis, age, lab data, medications, orders, and past utilization.   Low:  0-14.9   Medium: 15-21.9   High: 22-29.9   Extreme: 30 and above          Admitted From: Home Disposition: Home  Recommendations for Outpatient Follow-up:  Follow up with PCP in 1-2 weeks Please obtain BMP/CBC in one week Please follow up with your PCP on the following pending results: Unresulted Labs (From admission, onward)     Start     Ordered   08/14/23 0908  CBC with Differential/Platelet  Add-on,   AD        08/14/23 0907   08/11/23 1614  Protein / creatinine ratio, urine  Once,   R        08/11/23 1613              Home Health: None Equipment/Devices: None  Discharge Condition: Stable CODE STATUS: Full code Diet recommendation: Regular  Subjective: Seen and examined.  Pain very well-controlled.  Mother and father both at the bedside.  Several questions addressed.  All of them feel comfortable with her going home today.  They requested Zofran and some pain pills to go home as a backup.  Brief/Interim Summary: Renee Mcdowell is a 27 y.o. female with PMH of ADHD, anxiety, depression presented to the hospital with complaints of right-sided back pain, nausea and vomiting. 11/25 seen in the ED with complaints of right sided mid back pain..  Thought to be trapezius muscle straining secondary to overexertion.  (She is currently in the process of moving).  Was discharged with Flexeril and  prednisone. 11/29 seen again for the same pain now worsening.  Discharged back home. After going home started to have multiple vomiting episode with worsening pain. 12/2 present again to ED this time with pain involving right upper abdomen area especially after eating.  Was actually seen by PCP who ordered her to go to ED and recommended CT abdomen.  In the ED, she was found to have severe sepsis secondary to right-sided pyelonephritis and admitted under hospitalist service.  Details below.   Severe sepsis secondary to right-sided pyelonephritis and E. coli bacteremia: UA consistent with UTI and CT renal stone study suggesting possible acute pyelonephritis and small 2/3 mm nonobstructing right renal calculi.  Patient was started on Rocephin, blood culture growing pansensitive E. coli but urine culture grew insignificant growth.  The fact that patient kept complaining of anterior abdominal, epigastric, periumbilical as well as right upper quadrant pain and she was tender at those locations as well so she also underwent extensive workup and was seen by general surgery as well and was ruled out of appendicitis, cholecystitis and PE.  She was then transitioned to Zosyn.  Patient's pain has improved significantly, she is tolerating regular diet and having regular bowel movements as well.  No nausea.  Patient has received almost 6 days of antibiotics.  She is stable for discharge, will discharge her on 4 days  of Augmentin to complete 10-day course.  Also, per her request, I prescribed her 10 tablets of oxycodone to take as needed as well as Zofran ODT.    Acute kidney injury/metabolic acidosis. Baseline serum creatinine normal 0.7 based on the labs available 2 years ago however she presented with creatinine of 2.25 which worsened and had plateaued around 2.8 and CT abdomen completed 08/10/2023 ruled out renal obstruction and there is no hydronephrosis.  Since creatinine did not improve, nephrology consulted and  they opined that her AKI is likely secondary to NSAID utilization due to pain.  She also had mild metabolic acidosis, they recommended continuing LR and started on bicarb tablets.  Patient's creatinine has started to gradually improve and is down to 1.8 today.  Nephrology has signed off and has arranged outpatient follow-up for her.  Will resume Ringer's lactate for another 20 hours and repeat labs in the morning.  Acidosis also improved.   Elevated LFT. Bilirubin 1.7, AST normal, ALT mildly elevated. Currently parameters are improving.  Monitor values daily.   Acute thrombocytopenia. Platelet count at baseline around 300.  Thrombocytopenia likely secondary to severe sepsis which is improving.  No signs of bleeding.   Anemia-dilutional. Hemoglobin stable.   Hypoxia: Resolved patient saturating 99% on room air.  PE ruled out.   Depression. Will continue home regimen.   ADHD. Medications are currently on hold.   Constipation: Resolved.  Elevated blood pressure: Patient's diastolic blood pressure at times runs high between 85-90.  This could very well be due to pain.  Recommend rechecking at PCPs office and starting on medications if this remains elevated at that time.  Discharge plan was discussed with patient and/or family member and they verbalized understanding and agreed with it.  Discharge Diagnoses:  Principal Problem:   Pyelonephritis of right kidney Active Problems:   Major depressive disorder, recurrent severe without psychotic features (HCC)   Social phobia   Attention deficit hyperactivity disorder, predominantly inattentive type   Depression with anxiety   AKI (acute kidney injury) (HCC)   Thrombocytopenia (HCC)   Metabolic acidosis   Severe sepsis (HCC)   E coli bacteremia    Discharge Instructions   Allergies as of 08/14/2023       Reactions   Azithromycin Hives        Medication List     TAKE these medications    amoxicillin-clavulanate 875-125 MG  tablet Commonly known as: AUGMENTIN Take 1 tablet by mouth 2 (two) times daily for 4 days.   amphetamine-dextroamphetamine 20 MG 24 hr capsule Commonly known as: ADDERALL XR Take 20 mg by mouth daily.   amphetamine-dextroamphetamine 20 MG tablet Commonly known as: ADDERALL Take 20 mg by mouth 2 (two) times daily.   atomoxetine 100 MG capsule Commonly known as: STRATTERA Take 10 mg by mouth daily.   cyclobenzaprine 10 MG tablet Commonly known as: FLEXERIL Take 1 tablet (10 mg total) by mouth 2 (two) times daily as needed for muscle spasms.   hydrOXYzine 25 MG tablet Commonly known as: ATARAX Take 1 tablet (25 mg total) by mouth every 6 (six) hours as needed for anxiety.   ondansetron 4 MG disintegrating tablet Commonly known as: ZOFRAN-ODT Take 1 tablet (4 mg total) by mouth every 8 (eight) hours as needed for nausea or vomiting.   oxyCODONE 5 MG immediate release tablet Commonly known as: Oxy IR/ROXICODONE Take 1 tablet (5 mg total) by mouth every 12 (twelve) hours as needed for up to 10 doses for moderate  pain (pain score 4-6).   predniSONE 10 MG (21) Tbpk tablet Commonly known as: STERAPRED UNI-PAK 21 TAB Take by mouth daily. Take 6 tabs by mouth daily for 2 days, then 5 tabs for 2 days, then 4 tabs for 2 days, then 3 tabs for 2 days, 2 tabs for 2 days, then 1 tab by mouth daily for 2 days   tretinoin 0.025 % cream Commonly known as: RETIN-A Apply 1 Application topically at bedtime.   triamcinolone cream 0.5 % Commonly known as: KENALOG Apply 1 Application topically 3 (three) times daily.   Trintellix 20 MG Tabs tablet Generic drug: vortioxetine HBr Take 20 mg by mouth daily.        Follow-up Information     [name], MD Follow up in 1 week(s).   Specialty: Family Medicine Contact information: [phone] [address] [place] [address] [phone]                Allergies  Allergen Reactions   Azithromycin Hives    Consultations:  Nephrology and general surgery   Procedures/Studies: NM Pulmonary Perf and Vent  Result Date: 08/11/2023 CLINICAL DATA:  Elevated D-dimer. Short of breath. Low saturations. Concern for pulmonary embolism. EXAM: NUCLEAR MEDICINE PERFUSION LUNG SCAN TECHNIQUE: Perfusion images were obtained in multiple projections after intravenous injection of radiopharmaceutical. Ventilation scans intentionally deferred if perfusion scan and chest x-ray adequate for interpretation during COVID 19 epidemic. RADIOPHARMACEUTICALS:  4.3 mCi Tc-53m MAA IV COMPARISON:  Chest radiograph 08/08/2023, CT abdomen 08/10/2023 FINDINGS: No wedge-shaped peripheral perfusion defects in the LEFT or RIGHT lung to suggest acute pulmonary embolism. IMPRESSION: No evidence acute pulmonary embolism. Electronically Signed   By: Genevive Bi M.D.   On: 08/11/2023 10:25   CT ABDOMEN PELVIS WO CONTRAST  Result Date: 08/10/2023 CLINICAL DATA:  Right lower quadrant abdominal pain. EXAM: CT ABDOMEN AND PELVIS WITHOUT CONTRAST TECHNIQUE: Multidetector CT imaging of the abdomen and pelvis was performed following the standard protocol without IV contrast. RADIATION DOSE REDUCTION: This exam was performed according to the departmental dose-optimization program which includes automated exposure control, adjustment of the mA and/or kV according to patient size and/or use of iterative reconstruction technique. COMPARISON:  August 08, 2023. FINDINGS: Lower chest: Small pleural effusions are noted with adjacent subsegmental atelectasis or infiltrates. Hepatobiliary: Hepatic steatosis. No cholelithiasis or biliary dilatation. Pancreas: Unremarkable. No pancreatic ductal dilatation or surrounding inflammatory changes. Spleen: Normal in size without focal abnormality. Adrenals/Urinary Tract: Adrenal glands appear normal. Bilateral nonobstructive nephrolithiasis is noted. No definite hydronephrosis or renal obstruction is noted. Urinary bladder is  unremarkable. Stomach/Bowel: Stomach is within normal limits. Appendix appears normal. No evidence of bowel wall thickening, distention, or inflammatory changes. Vascular/Lymphatic: No significant vascular findings are present. No enlarged abdominal or pelvic lymph nodes. Reproductive: Uterus and bilateral adnexa are unremarkable. Other: Small amount of free fluid is noted in the pelvis which may be physiologic. No hernia is noted. Musculoskeletal: No acute or significant osseous findings. IMPRESSION: Small pleural effusions are noted with adjacent subsegmental atelectasis or infiltrates. Bilateral nonobstructive nephrolithiasis. Hepatic steatosis. Small amount of free fluid is noted in the pelvis which may be physiologic. Electronically Signed   By: Lupita Raider M.D.   On: 08/10/2023 15:20   NM Hepatobiliary Liver Func  Result Date: 08/10/2023 CLINICAL DATA:  Right upper quadrant abdominal pain. Evaluate for acute cholecystitis. EXAM: NUCLEAR MEDICINE HEPATOBILIARY IMAGING TECHNIQUE: Sequential images of the abdomen were obtained out to 60 minutes following intravenous administration of  radiopharmaceutical. RADIOPHARMACEUTICALS:  5.4 mCi Tc-80m  Choletec IV COMPARISON:  CT abdomen and pelvis-08/08/2019; right upper quadrant abdominal pain-08/08/2023 FINDINGS: There is homogeneous distribution of injected radiotracer throughout the hepatic parenchyma. There is early excretion of radiotracer with opacification of the gallbladder, initially seen on the 10 minute anterior projection of the image. There is early excretion of radiotracer with opacification of the proximal small bowel, initially seen on the 30 minute anterior projection planar image. IMPRESSION: No scintigraphic evidence of cystic duct obstruction/acute cholecystitis. Electronically Signed   By: Simonne Come M.D.   On: 08/10/2023 10:36   DG Chest Portable 1 View  Result Date: 08/09/2023 CLINICAL DATA:  Right shoulder pain. EXAM: PORTABLE CHEST 1  VIEW COMPARISON:  November 14, 2019 FINDINGS: The heart size and mediastinal contours are within normal limits. Mildly decreased lung volumes are seen with mild elevation of the right hemidiaphragm. There is no evidence of an acute infiltrate, pleural effusion or pneumothorax. The visualized skeletal structures are unremarkable. IMPRESSION: No active disease. Electronically Signed   By: Aram Candela M.D.   On: 08/09/2023 02:20   CT Renal Stone Study  Result Date: 08/08/2023 CLINICAL DATA:  Right-sided abdominal pain and right-sided back pain. EXAM: CT ABDOMEN AND PELVIS WITHOUT CONTRAST TECHNIQUE: Multidetector CT imaging of the abdomen and pelvis was performed following the standard protocol without IV contrast. RADIATION DOSE REDUCTION: This exam was performed according to the departmental dose-optimization program which includes automated exposure control, adjustment of the mA and/or kV according to patient size and/or use of iterative reconstruction technique. COMPARISON:  July 15, 2021 FINDINGS: Lower chest: Mild atelectatic changes are seen within the posterior aspect of the left lung base. Hepatobiliary: No focal liver abnormality is seen. The gallbladder is contracted without evidence of gallstones, gallbladder wall thickening, or biliary dilatation. Pancreas: Unremarkable. No pancreatic ductal dilatation or surrounding inflammatory changes. Spleen: Normal in size without focal abnormality. Adrenals/Urinary Tract: Adrenal glands are unremarkable. There is mild asymmetric enlargement of the right kidney without focal lesions, hydronephrosis or obstructing renal calculi. 2 mm and 3 mm nonobstructing renal calculi are seen within the mid and lower right kidney. There is mild right-sided perinephric inflammatory fat stranding. Bladder is unremarkable. Stomach/Bowel: Stomach is within normal limits. The appendix is not clearly identified. No evidence of bowel dilatation. Mild inflammatory fat stranding  is seen along the posterolateral aspect of the right lower quadrant and pelvis on the right. Vascular/Lymphatic: No significant vascular findings are present. No enlarged abdominal or pelvic lymph nodes. Reproductive: Uterus and bilateral adnexa are unremarkable. Other: No abdominal wall hernia or abnormality. A very small amount of posterior pelvic free fluid is seen. Musculoskeletal: No acute or significant osseous findings. IMPRESSION: 1. Mild asymmetric enlargement of the right kidney with mild right-sided perinephric inflammatory fat stranding. While this may be secondary to a recently passed renal stone, sequelae associated with acute pyelonephritis cannot be excluded. Correlation with urinalysis is recommended. 2. 2 mm and 3 mm nonobstructing right renal calculi. 3. Mild inflammatory fat stranding along the posterolateral aspect of the right lower quadrant and pelvis on the right. Without visualization of a normal appendix, appendicitis cannot be excluded. Further evaluation with follow-up abdomen pelvis CT following oral and intravenous contrast is recommended if appendicitis is of clinical concern. 4. Very small amount of posterior pelvic free fluid, likely physiologic. Electronically Signed   By: Aram Candela M.D.   On: 08/08/2023 22:44   US Abdomen Limited RUQ (LIVER/GB)  Result Date: 08/08/2023 CLINICAL DATA:  Right upper quadrant pain and back pain. EXAM: ULTRASOUND ABDOMEN LIMITED RIGHT UPPER QUADRANT COMPARISON:  None Available. FINDINGS: Gallbladder: The gallbladder is partially contracted with gallbladder wall thickness of 2.6 mm. No gallstones are visualized. A mild amount of pericholecystic fluid is noted. No sonographic Murphy sign noted by sonographer. Common bile duct: Diameter: 4.7 mm Liver: No focal lesion identified. Within normal limits in parenchymal echogenicity. Portal vein is patent on color Doppler imaging with normal direction of blood flow towards the liver. Other: None.  IMPRESSION: 1. Partially contracted gallbladder with subsequent gallbladder wall thickening and mild pericholecystic fluid. 2. No evidence of cholelithiasis or acute cholecystitis. Electronically Signed   By: Aram Candela M.D.   On: 08/08/2023 22:38     Discharge Exam: Vitals:   08/14/23 0554 08/14/23 0600  BP: (!) 140/87   Pulse: (!) 102   Resp: 17 17  Temp: 98.4 F (36.9 C)   SpO2: 98%    Vitals:   08/13/23 2213 08/14/23 0000 08/14/23 0554 08/14/23 0600  BP:   (!) 140/87   Pulse:   (!) 102   Resp: 16 15 17 17   Temp:   98.4 F (36.9 C)   TempSrc:   Oral   SpO2:   98%   Weight:      Height:        General: Pt is alert, awake, not in acute distress Cardiovascular: RRR, S1/S2 +, no rubs, no gallops Respiratory: CTA bilaterally, no wheezing, no rhonchi Abdominal: Soft, NT, ND, bowel sounds + Extremities: no edema, no cyanosis    The results of significant diagnostics from this hospitalization (including imaging, microbiology, ancillary and laboratory) are listed below for reference.     Microbiology: Recent Results (from the past 240 hour(s))  Blood culture (routine x 2)     Status: Abnormal   Collection Time: 08/08/23  7:34 PM   Specimen: BLOOD  Result Value Ref Range Status   Specimen Description   Final    BLOOD LEFT ANTECUBITAL Performed at [facility], [address] [place], [place] [address]    Special Requests   Final    BOTTLES DRAWN AEROBIC AND ANAEROBIC Blood Culture adequate volume Performed at [facility], [address], [place], [place] [address]    Culture  Setup Time   Final    GRAM NEGATIVE RODS IN BOTH AEROBIC AND ANAEROBIC BOTTLES CRITICAL VALUE NOTED.  VALUE IS CONSISTENT WITH PREVIOUSLY REPORTED AND CALLED VALUE.    Culture (A)  Final    ESCHERICHIA COLI SUSCEPTIBILITIES PERFORMED ON PREVIOUS CULTURE WITHIN THE LAST 5 DAYS. Performed at [facility], [address], [place], [place]  [address]    Report Status 08/11/2023 FINAL  Final  Blood culture (routine x 2)     Status: Abnormal   Collection Time: 08/08/23  7:34 PM   Specimen: BLOOD  Result Value Ref Range Status   Specimen Description   Final    BLOOD RIGHT ANTECUBITAL Performed at [facility], [address] [place], [place] [address]    Special Requests   Final    BOTTLES DRAWN AEROBIC AND ANAEROBIC Blood Culture adequate volume Performed at [facility], [address] [place], [place] [address]    Culture  Setup Time   Final    GRAM NEGATIVE RODS IN BOTH AEROBIC AND ANAEROBIC BOTTLES CRITICAL RESULT CALLED TO, READ BACK BY AND VERIFIED WITH: C MAS AT [facility] [id number] AT 933 AM BY CM CRITICAL RESULT CALLED  TO, READ BACK BY AND VERIFIED WITH: PHARMD A PHAM [id number] AT 939 AM BY CM Performed at [facility], [address] [place], [place] [address]    Culture ESCHERICHIA COLI (A)  Final   Report Status 08/11/2023 FINAL  Final   Organism ID, Bacteria ESCHERICHIA COLI  Final   Organism ID, Bacteria ESCHERICHIA COLI  Final      Susceptibility   Escherichia coli - KIRBY BAUER*    CEFAZOLIN SENSITIVE Sensitive    Escherichia coli - MIC*    AMPICILLIN <=2 SENSITIVE Sensitive     CEFEPIME <=0.12 SENSITIVE Sensitive     CEFTAZIDIME <=1 SENSITIVE Sensitive     CEFTRIAXONE <=0.25 SENSITIVE Sensitive     CIPROFLOXACIN <=0.25 SENSITIVE Sensitive     GENTAMICIN <=1 SENSITIVE Sensitive     IMIPENEM <=0.25 SENSITIVE Sensitive     TRIMETH/SULFA <=20 SENSITIVE Sensitive     AMPICILLIN/SULBACTAM <=2 SENSITIVE Sensitive     PIP/TAZO <=4 SENSITIVE Sensitive ug/mL    * ESCHERICHIA COLI    ESCHERICHIA COLI  Blood Culture ID Panel (Reflexed)     Status: Abnormal   Collection Time: 08/08/23  7:34 PM  Result Value Ref Range Status   Enterococcus faecalis NOT DETECTED NOT DETECTED Final   Enterococcus Faecium NOT DETECTED NOT DETECTED Final   Listeria monocytogenes NOT DETECTED  NOT DETECTED Final   Staphylococcus species NOT DETECTED NOT DETECTED Final   Staphylococcus aureus (BCID) NOT DETECTED NOT DETECTED Final   Staphylococcus epidermidis NOT DETECTED NOT DETECTED Final   Staphylococcus lugdunensis NOT DETECTED NOT DETECTED Final   Streptococcus species NOT DETECTED NOT DETECTED Final   Streptococcus agalactiae NOT DETECTED NOT DETECTED Final   Streptococcus pneumoniae NOT DETECTED NOT DETECTED Final   Streptococcus pyogenes NOT DETECTED NOT DETECTED Final   A.calcoaceticus-baumannii NOT DETECTED NOT DETECTED Final   Bacteroides fragilis NOT DETECTED NOT DETECTED Final   Enterobacterales DETECTED (A) NOT DETECTED Final    Comment: Enterobacterales represent a large order of gram negative bacteria, not a single organism. CRITICAL RESULT CALLED TO, READ BACK BY AND VERIFIED WITH: ED AT [facility] C MAS [id number] AT 933 AM BY CM    Enterobacter cloacae complex NOT DETECTED NOT DETECTED Final   Escherichia coli DETECTED (A) NOT DETECTED Final    Comment: CRITICAL RESULT CALLED TO, READ BACK BY AND VERIFIED WITH:  ED AT [facility] C MAS [id number] AT 933 AM BY CM    Klebsiella aerogenes NOT DETECTED NOT DETECTED Final   Klebsiella oxytoca NOT DETECTED NOT DETECTED Final   Klebsiella pneumoniae NOT DETECTED NOT DETECTED Final   Proteus species NOT DETECTED NOT DETECTED Final   Salmonella species NOT DETECTED NOT DETECTED Final   Serratia marcescens NOT DETECTED NOT DETECTED Final   Haemophilus influenzae NOT DETECTED NOT DETECTED Final   Neisseria meningitidis NOT DETECTED NOT DETECTED Final   Pseudomonas aeruginosa NOT DETECTED NOT DETECTED Final   Stenotrophomonas maltophilia NOT DETECTED NOT DETECTED Final   Candida albicans NOT DETECTED NOT DETECTED Final   Candida auris NOT DETECTED NOT DETECTED Final   Candida glabrata NOT DETECTED NOT DETECTED Final   Candida krusei NOT DETECTED NOT DETECTED Final   Candida parapsilosis NOT DETECTED NOT DETECTED Final   Candida  tropicalis NOT DETECTED NOT DETECTED Final   Cryptococcus neoformans/gattii NOT DETECTED NOT DETECTED Final   CTX-M ESBL NOT DETECTED NOT DETECTED Final   Carbapenem resistance IMP NOT DETECTED NOT DETECTED Final   Carbapenem resistance KPC NOT DETECTED  NOT DETECTED Final   Carbapenem resistance NDM NOT DETECTED NOT DETECTED Final   Carbapenem resist OXA 48 LIKE NOT DETECTED NOT DETECTED Final   Carbapenem resistance VIM NOT DETECTED NOT DETECTED Final    Comment: Performed at [facility], [address], [place], [place] [address]  Urine Culture     Status: Abnormal   Collection Time: 08/08/23  7:35 PM   Specimen: Urine, Clean Catch  Result Value Ref Range Status   Specimen Description   Final    URINE, CLEAN CATCH Performed at [facility], [address], [place], [place] [address]    Special Requests   Final    NONE Performed at [facility], [address] [place], [place] [address]    Culture (A)  Final    <10,000 COLONIES/mL INSIGNIFICANT GROWTH Performed at [facility], [address], [place], [place] [address]    Report Status 08/10/2023 FINAL  Final     Labs: BNP (last 3 results) No results for input(s): "BNP" in the last 8760 hours. Basic Metabolic Panel: Recent Labs  Lab 08/09/23 0444 08/09/23 2027 08/10/23 0720 08/11/23 0514 08/12/23 0457 08/13/23 0453 08/14/23 0446  NA 132*   < > 135 132* 130* 133* 137  K 3.2*   < > 3.8 3.9 4.4 3.6 3.7  CL 108   < > 108 109 106 107 106  CO2 20*   < > 16* 14* 15* 18* 21*  GLUCOSE 111*   < > 59* 75 80 104* 90  BUN 33*   < > 41* 38* 34* 28* 19  CREATININE 2.33*   < > 2.82* 2.80* 2.47* 2.00* 1.81*  CALCIUM 7.2*   < > 7.6* 7.6* 7.2* 7.7* 7.7*  MG 2.3  --  2.4  --   --   --   --   PHOS 2.5  --   --   --   --   --   --    < > = values in this interval not displayed.   Liver Function Tests: Recent Labs  Lab 08/08/23 1735 08/09/23 0444 08/10/23 0720 08/11/23 0514  08/12/23 0457  AST 32 28 20 15 19   ALT 92* 70* 48* 34 26  ALKPHOS 132* 128* 100 119 124  BILITOT 1.7* 1.2* 1.4* 1.2* 1.6*  PROT 5.9* 5.2* 4.9* 4.7* 4.4*  ALBUMIN 3.2* 2.1* 1.9* 1.8* 1.6*   Recent Labs  Lab 08/08/23 1735  LIPASE <10*   No results for input(s): "AMMONIA" in the last 168 hours. CBC: Recent Labs  Lab 08/08/23 1735 08/09/23 0444 08/10/23 0720 08/11/23 0514 08/12/23 0642  WBC 19.0* 15.6* 17.1* 15.9* 14.0*  NEUTROABS  --   --  15.1* 13.3*  --   HGB 11.8* 10.8* 10.3* 9.1* 9.3*  HCT 34.8* 33.1* 30.5* 28.6* 29.0*  MCV 84.1 89.0 87.6 87.7 88.4  PLT 127* 98* 80* 101* 134*   Cardiac Enzymes: No results for input(s): "CKTOTAL", "CKMB", "CKMBINDEX", "TROPONINI" in the last 168 hours. BNP: Invalid input(s): "POCBNP" CBG: No results for input(s): "GLUCAP" in the last 168 hours. D-Dimer No results for input(s): "DDIMER" in the last 72 hours. Hgb A1c No results for input(s): "HGBA1C" in the last 72 hours. Lipid Profile No results for input(s): "CHOL", "HDL", "LDLCALC", "TRIG", "CHOLHDL", "LDLDIRECT" in the last 72 hours. Thyroid function studies No results for input(s): "TSH", "T4TOTAL", "T3FREE", "THYROIDAB" in the last 72 hours.  Invalid input(s): "FREET3" Anemia work up [employer]  08/12/23 0457  FERRITIN 217  TIBC 179*  IRON 59   Urinalysis    Component Value Date/Time   COLORURINE YELLOW 08/08/2023 1935   APPEARANCEUR HAZY (A) 08/08/2023 1935   LABSPEC 1.015 08/08/2023 1935   PHURINE 6.0 08/08/2023 1935   GLUCOSEU NEGATIVE 08/08/2023 1935   HGBUR MODERATE (A) 08/08/2023 1935   BILIRUBINUR NEGATIVE 08/08/2023 1935   KETONESUR NEGATIVE 08/08/2023 1935   PROTEINUR 100 (A) 08/08/2023 1935   NITRITE NEGATIVE 08/08/2023 1935   LEUKOCYTESUR LARGE (A) 08/08/2023 1935   Sepsis Labs Recent Labs  Lab 08/09/23 0444 08/10/23 0720 08/11/23 0514 08/12/23 0642  WBC 15.6* 17.1* 15.9* 14.0*   Microbiology Recent Results (from the past 240 hour(s))   Blood culture (routine x 2)     Status: Abnormal   Collection Time: 08/08/23  7:34 PM   Specimen: BLOOD  Result Value Ref Range Status   Specimen Description   Final    BLOOD LEFT ANTECUBITAL Performed at [facility], [address] [place], [place] [address]    Special Requests   Final    BOTTLES DRAWN AEROBIC AND ANAEROBIC Blood Culture adequate volume Performed at [facility], [address] [place], [place] [address]    Culture  Setup Time   Final    GRAM NEGATIVE RODS IN BOTH AEROBIC AND ANAEROBIC BOTTLES CRITICAL VALUE NOTED.  VALUE IS CONSISTENT WITH PREVIOUSLY REPORTED AND CALLED VALUE.    Culture (A)  Final    ESCHERICHIA COLI SUSCEPTIBILITIES PERFORMED ON PREVIOUS CULTURE WITHIN THE LAST 5 DAYS. Performed at [facility], [address] [place], [place] [address]    Report Status 08/11/2023 FINAL  Final  Blood culture (routine x 2)     Status: Abnormal   Collection Time: 08/08/23  7:34 PM   Specimen: BLOOD  Result Value Ref Range Status   Specimen Description   Final    BLOOD RIGHT ANTECUBITAL Performed at [facility], [address], [place], [place] [address]    Special Requests   Final    BOTTLES DRAWN AEROBIC AND ANAEROBIC Blood Culture adequate volume Performed at [facility], [address], [place], [place] [address]    Culture  Setup Time   Final    GRAM NEGATIVE RODS IN BOTH AEROBIC AND ANAEROBIC BOTTLES CRITICAL RESULT CALLED TO, READ BACK BY AND VERIFIED WITH: C MAS AT [facility] [id number] AT 933 AM BY CM CRITICAL RESULT CALLED TO, READ BACK BY AND VERIFIED WITH: PHARMD A PHAM [id number] AT 939 AM BY CM Performed at [facility], [address] [place], [place] [address]    Culture ESCHERICHIA COLI (A)  Final   Report Status 08/11/2023 FINAL  Final   Organism ID, Bacteria ESCHERICHIA COLI  Final   Organism ID, Bacteria ESCHERICHIA COLI  Final      Susceptibility    Escherichia coli - KIRBY BAUER*    CEFAZOLIN SENSITIVE Sensitive    Escherichia coli - MIC*    AMPICILLIN <=2 SENSITIVE Sensitive     CEFEPIME <=0.12 SENSITIVE Sensitive     CEFTAZIDIME <=1 SENSITIVE Sensitive     CEFTRIAXONE <=0.25 SENSITIVE Sensitive     CIPROFLOXACIN <=0.25 SENSITIVE Sensitive     GENTAMICIN <=1 SENSITIVE Sensitive     IMIPENEM <=0.25 SENSITIVE Sensitive     TRIMETH/SULFA <=20 SENSITIVE Sensitive     AMPICILLIN/SULBACTAM <=2 SENSITIVE Sensitive     PIP/TAZO <=4 SENSITIVE Sensitive ug/mL    * ESCHERICHIA COLI  ESCHERICHIA COLI  Blood Culture ID Panel (Reflexed)     Status: Abnormal   Collection Time: 08/08/23  7:34 PM  Result Value Ref Range Status   Enterococcus faecalis NOT DETECTED NOT DETECTED Final   Enterococcus Faecium NOT DETECTED NOT DETECTED Final   Listeria monocytogenes NOT DETECTED NOT DETECTED Final   Staphylococcus species NOT DETECTED NOT DETECTED Final   Staphylococcus aureus (BCID) NOT DETECTED NOT DETECTED Final   Staphylococcus epidermidis NOT DETECTED NOT DETECTED Final   Staphylococcus lugdunensis NOT DETECTED NOT DETECTED Final   Streptococcus species NOT DETECTED NOT DETECTED Final   Streptococcus agalactiae NOT DETECTED NOT DETECTED Final   Streptococcus pneumoniae NOT DETECTED NOT DETECTED Final   Streptococcus pyogenes NOT DETECTED NOT DETECTED Final   A.calcoaceticus-baumannii NOT DETECTED NOT DETECTED Final   Bacteroides fragilis NOT DETECTED NOT DETECTED Final   Enterobacterales DETECTED (A) NOT DETECTED Final    Comment: Enterobacterales represent a large order of gram negative bacteria, not a single organism. CRITICAL RESULT CALLED TO, READ BACK BY AND VERIFIED WITH: ED AT [facility] C MAS [id number] AT 933 AM BY CM    Enterobacter cloacae complex NOT DETECTED NOT DETECTED Final   Escherichia coli DETECTED (A) NOT DETECTED Final    Comment: CRITICAL RESULT CALLED TO, READ BACK BY AND VERIFIED WITH:  ED AT [facility] C MAS [id number] AT  933 AM BY CM    Klebsiella aerogenes NOT DETECTED NOT DETECTED Final   Klebsiella oxytoca NOT DETECTED NOT DETECTED Final   Klebsiella pneumoniae NOT DETECTED NOT DETECTED Final   Proteus species NOT DETECTED NOT DETECTED Final   Salmonella species NOT DETECTED NOT DETECTED Final   Serratia marcescens NOT DETECTED NOT DETECTED Final   Haemophilus influenzae NOT DETECTED NOT DETECTED Final   Neisseria meningitidis NOT DETECTED NOT DETECTED Final   Pseudomonas aeruginosa NOT DETECTED NOT DETECTED Final   Stenotrophomonas maltophilia NOT DETECTED NOT DETECTED Final   Candida albicans NOT DETECTED NOT DETECTED Final   Candida auris NOT DETECTED NOT DETECTED Final   Candida glabrata NOT DETECTED NOT DETECTED Final   Candida krusei NOT DETECTED NOT DETECTED Final   Candida parapsilosis NOT DETECTED NOT DETECTED Final   Candida tropicalis NOT DETECTED NOT DETECTED Final   Cryptococcus neoformans/gattii NOT DETECTED NOT DETECTED Final   CTX-M ESBL NOT DETECTED NOT DETECTED Final   Carbapenem resistance IMP NOT DETECTED NOT DETECTED Final   Carbapenem resistance KPC NOT DETECTED NOT DETECTED Final   Carbapenem resistance NDM NOT DETECTED NOT DETECTED Final   Carbapenem resist OXA 48 LIKE NOT DETECTED NOT DETECTED Final   Carbapenem resistance VIM NOT DETECTED NOT DETECTED Final    Comment: Performed at [facility], [address] [place], [place] [address]  Urine Culture     Status: Abnormal   Collection Time: 08/08/23  7:35 PM   Specimen: Urine, Clean Catch  Result Value Ref Range Status   Specimen Description   Final    URINE, CLEAN CATCH Performed at [facility], [address], [place], [place] [address]    Special Requests   Final    NONE Performed at [facility], [address] [place], [place] [address]    Culture (A)  Final    <10,000 COLONIES/mL INSIGNIFICANT GROWTH Performed at [facility], [address],  [place], [place] [address]    Report Status 08/10/2023 FINAL  Final    FURTHER DISCHARGE INSTRUCTIONS:   Get Medicines reviewed and adjusted:  Please take all your medications with you for your next visit with your Primary MD   Laboratory/radiological data: Please request your Primary MD to go over all hospital tests and procedure/radiological results at the follow up, please ask your Primary MD to get all Hospital records sent to his/her office.   In some cases, they will be blood work, cultures and biopsy results pending at the time of your discharge. Please request that your primary care M.D. goes through all the records of your hospital data and follows up on these results.   Also Note the following: If you experience worsening of your admission symptoms, develop shortness of breath, life threatening emergency, suicidal or homicidal thoughts you must seek medical attention immediately by calling 911 or calling your MD immediately  if symptoms less severe.   You must read complete instructions/literature along with all the possible adverse reactions/side effects for all the Medicines you take and that have been prescribed to you. Take any new Medicines after you have completely understood and accpet all the possible adverse reactions/side effects.    Do not drive when taking Pain medications or sleeping medications (Benzodaizepines)   Do not take more than prescribed Pain, Sleep and Anxiety Medications. It is not advisable to combine anxiety,sleep and pain medications without talking with your primary care practitioner   Special Instructions: If you have smoked or chewed Tobacco  in the last 2 yrs please stop smoking, stop any regular Alcohol  and or any Recreational drug use.   Wear Seat belts while driving.   Please note: You were cared for by a hospitalist during your hospital stay. Once you are discharged, your primary care physician will handle any further medical issues. Please note  that NO REFILLS for any discharge medications will be authorized once you are discharged, as it is imperative that you return to your primary care physician (or establish a relationship with a primary care physician if you do not have one) for your post hospital discharge needs so that they can reassess your need for medications and monitor your lab values  Time coordinating discharge: Over 30 minutes  SIGNED:   Hughie Closs, MD  [facility] 08/14/2023, 10:59 AM *Please note that this is a verbal dictation therefore any spelling or grammatical errors are due to the "[employer] One" system interpretation. If 7PM-7AM, please contact night-coverage [email or web address]

## 2023-08-16 DIAGNOSIS — F3342 Major depressive disorder, recurrent, in full remission: Secondary | ICD-10-CM | POA: Diagnosis not present

## 2023-08-16 DIAGNOSIS — F900 Attention-deficit hyperactivity disorder, predominantly inattentive type: Secondary | ICD-10-CM | POA: Diagnosis not present

## 2023-08-24 ENCOUNTER — Emergency Department (HOSPITAL_COMMUNITY): Payer: BC Managed Care – PPO

## 2023-08-24 ENCOUNTER — Encounter (HOSPITAL_COMMUNITY): Payer: Self-pay

## 2023-08-24 ENCOUNTER — Other Ambulatory Visit: Payer: Self-pay

## 2023-08-24 ENCOUNTER — Observation Stay (HOSPITAL_COMMUNITY)
Admission: EM | Admit: 2023-08-24 | Discharge: 2023-08-27 | Disposition: A | Discharge status: Home / Self Care | Payer: BC Managed Care – PPO | Attending: Internal Medicine | Admitting: Internal Medicine

## 2023-08-24 DIAGNOSIS — F332 Major depressive disorder, recurrent severe without psychotic features: Secondary | ICD-10-CM | POA: Diagnosis present

## 2023-08-24 DIAGNOSIS — R6520 Severe sepsis without septic shock: Secondary | ICD-10-CM | POA: Diagnosis not present

## 2023-08-24 DIAGNOSIS — R7881 Bacteremia: Secondary | ICD-10-CM | POA: Insufficient documentation

## 2023-08-24 DIAGNOSIS — D509 Iron deficiency anemia, unspecified: Secondary | ICD-10-CM | POA: Insufficient documentation

## 2023-08-24 DIAGNOSIS — N200 Calculus of kidney: Secondary | ICD-10-CM | POA: Diagnosis not present

## 2023-08-24 DIAGNOSIS — E538 Deficiency of other specified B group vitamins: Secondary | ICD-10-CM | POA: Diagnosis not present

## 2023-08-24 DIAGNOSIS — G9341 Metabolic encephalopathy: Secondary | ICD-10-CM | POA: Diagnosis not present

## 2023-08-24 DIAGNOSIS — A419 Sepsis, unspecified organism: Secondary | ICD-10-CM | POA: Diagnosis not present

## 2023-08-24 DIAGNOSIS — Z87891 Personal history of nicotine dependence: Secondary | ICD-10-CM | POA: Diagnosis not present

## 2023-08-24 DIAGNOSIS — D72829 Elevated white blood cell count, unspecified: Secondary | ICD-10-CM | POA: Diagnosis not present

## 2023-08-24 DIAGNOSIS — K820 Obstruction of gallbladder: Secondary | ICD-10-CM | POA: Diagnosis not present

## 2023-08-24 DIAGNOSIS — D75839 Thrombocytosis, unspecified: Secondary | ICD-10-CM | POA: Diagnosis not present

## 2023-08-24 DIAGNOSIS — R9431 Abnormal electrocardiogram [ECG] [EKG]: Secondary | ICD-10-CM | POA: Diagnosis not present

## 2023-08-24 DIAGNOSIS — D649 Anemia, unspecified: Secondary | ICD-10-CM | POA: Diagnosis present

## 2023-08-24 DIAGNOSIS — N12 Tubulo-interstitial nephritis, not specified as acute or chronic: Secondary | ICD-10-CM | POA: Diagnosis not present

## 2023-08-24 DIAGNOSIS — F900 Attention-deficit hyperactivity disorder, predominantly inattentive type: Secondary | ICD-10-CM | POA: Diagnosis present

## 2023-08-24 DIAGNOSIS — Z79899 Other long term (current) drug therapy: Secondary | ICD-10-CM | POA: Insufficient documentation

## 2023-08-24 DIAGNOSIS — N179 Acute kidney failure, unspecified: Secondary | ICD-10-CM | POA: Diagnosis not present

## 2023-08-24 DIAGNOSIS — F1010 Alcohol abuse, uncomplicated: Secondary | ICD-10-CM | POA: Diagnosis present

## 2023-08-24 DIAGNOSIS — F17200 Nicotine dependence, unspecified, uncomplicated: Secondary | ICD-10-CM | POA: Diagnosis present

## 2023-08-24 DIAGNOSIS — N289 Disorder of kidney and ureter, unspecified: Secondary | ICD-10-CM | POA: Diagnosis not present

## 2023-08-24 DIAGNOSIS — M549 Dorsalgia, unspecified: Secondary | ICD-10-CM | POA: Diagnosis not present

## 2023-08-24 LAB — URINALYSIS, ROUTINE W REFLEX MICROSCOPIC
Bilirubin Urine: NEGATIVE
Glucose, UA: NEGATIVE mg/dL
Ketones, ur: NEGATIVE mg/dL
Nitrite: NEGATIVE
Protein, ur: NEGATIVE mg/dL
Specific Gravity, Urine: 1.005 (ref 1.005–1.030)
pH: 6 (ref 5.0–8.0)

## 2023-08-24 LAB — CBC
HCT: 27.1 % — ABNORMAL LOW (ref 36.0–46.0)
Hemoglobin: 8.7 g/dL — ABNORMAL LOW (ref 12.0–15.0)
MCH: 28.3 pg (ref 26.0–34.0)
MCHC: 32.1 g/dL (ref 30.0–36.0)
MCV: 88.3 fL (ref 80.0–100.0)
Platelets: 1040 10*3/uL (ref 150–400)
RBC: 3.07 MIL/uL — ABNORMAL LOW (ref 3.87–5.11)
RDW: 14.1 % (ref 11.5–15.5)
WBC: 8.7 10*3/uL (ref 4.0–10.5)
nRBC: 0 % (ref 0.0–0.2)

## 2023-08-24 LAB — COMPREHENSIVE METABOLIC PANEL
ALT: 14 U/L (ref 0–44)
AST: 15 U/L (ref 15–41)
Albumin: 3.5 g/dL (ref 3.5–5.0)
Alkaline Phosphatase: 91 U/L (ref 38–126)
Anion gap: 11 (ref 5–15)
BUN: 12 mg/dL (ref 6–20)
CO2: 22 mmol/L (ref 22–32)
Calcium: 9.2 mg/dL (ref 8.9–10.3)
Chloride: 105 mmol/L (ref 98–111)
Creatinine, Ser: 1.45 mg/dL — ABNORMAL HIGH (ref 0.44–1.00)
GFR, Estimated: 50 mL/min — ABNORMAL LOW (ref >60)
Glucose, Bld: 89 mg/dL (ref 70–99)
Potassium: 3.6 mmol/L (ref 3.5–5.1)
Sodium: 138 mmol/L (ref 135–145)
Total Bilirubin: 0.6 mg/dL (ref <1.2)
Total Protein: 8 g/dL (ref 6.5–8.1)

## 2023-08-24 LAB — WET PREP, GENITAL
Clue Cells Wet Prep HPF POC: NONE SEEN
Sperm: NONE SEEN
Trich, Wet Prep: NONE SEEN
WBC, Wet Prep HPF POC: <10 (ref <10)
Yeast Wet Prep HPF POC: NONE SEEN

## 2023-08-24 LAB — LIPASE, BLOOD: Lipase: 106 U/L — ABNORMAL HIGH (ref 11–51)

## 2023-08-24 LAB — HCG, SERUM, QUALITATIVE: Preg, Serum: NEGATIVE

## 2023-08-24 MED ORDER — PIPERACILLIN-TAZOBACTAM 3.375 G IVPB
3.3750 g | Freq: Three times a day (TID) | INTRAVENOUS | Status: DC
Start: 2023-08-25 — End: 2023-08-26
  Administered 2023-08-25 – 2023-08-26 (×4): 3.375 g via INTRAVENOUS
  Filled 2023-08-24 (×4): qty 50

## 2023-08-24 MED ORDER — SODIUM CHLORIDE 0.9 % IV BOLUS
2000.0000 mL | Freq: Once | INTRAVENOUS | Status: AC
  Administered 2023-08-25: 2000 mL via INTRAVENOUS

## 2023-08-24 MED ORDER — PIPERACILLIN-TAZOBACTAM 3.375 G IVPB 30 MIN
3.3750 g | Freq: Once | INTRAVENOUS | Status: AC
  Administered 2023-08-24: 3.375 g via INTRAVENOUS
  Filled 2023-08-24: qty 50

## 2023-08-24 NOTE — Progress Notes (Signed)
Pharmacy Antibiotic Note  Renee Mcdowell is a 27 y.o. female admitted on 08/24/2023 with sepsis.  Pharmacy has been consulted for Zosyn dosing.  Plan: Zosyn 3.375g IV q8h (4 hour infusion). No dose adjustments anticipated.  Pharmacy will sign off and monitor peripherally via electronic surveillance software for any changes in renal function or micro data.   Height: 5\' 6"  (167.6 cm) Weight: 86 kg (189 lb 9.5 oz) IBW/kg (Calculated) : 59.3  Temp (24hrs), Avg:98.2 F (36.8 C), Min:98.1 F (36.7 C), Max:98.3 F (36.8 C)  Recent Labs  Lab 08/24/23 1740  WBC 8.7  CREATININE 1.45*    Estimated Creatinine Clearance: 63.3 mL/min (A) (by C-G formula based on SCr of 1.45 mg/dL (H)).    Allergies  Allergen Reactions   Azithromycin Hives    Thank you for allowing pharmacy to be a part of this patient's care.  Junita Push PharmD 08/24/2023 11:32 PM

## 2023-08-24 NOTE — ED Provider Notes (Signed)
[place] EMERGENCY DEPARTMENT AT [facility] Provider Note   CSN: [id number] Arrival date & time: 08/24/23  1708     History  Chief Complaint  Patient presents with   abnormal labs    Renee Mcdowell is a 27 y.o. female, history of severe sepsis, due to pyelonephritis, with recent admission, discharged on 12/3.  She states that she has been having some blood in her urine, vaginal discharge, that is yellowish and thick, and some right shoulder pain.  She states last time she was admitted, for this and sepsis, her symptoms were the exact same.  She states she went to her primary care today, and she was told that she had a critical lab value, so she need to come to the ER.  She denies any IV drug use, abdominal pain, nausea, vomiting, shortness of breath, or chest pain.  No recent fevers or chills.  Denies any urinary frequency, urgency, but does endorse some hematuria, mostly when she wipes.  Home Medications Prior to Admission medications   Medication Sig Start Date End Date Taking? Authorizing Provider  amphetamine-dextroamphetamine (ADDERALL XR) 20 MG 24 hr capsule Take 20 mg by mouth daily.    [provider]  amphetamine-dextroamphetamine (ADDERALL) 20 MG tablet Take 20 mg by mouth 2 (two) times daily.    [provider]  atomoxetine (STRATTERA) 100 MG capsule Take 10 mg by mouth daily.    [provider]  cyclobenzaprine (FLEXERIL) 10 MG tablet Take 1 tablet (10 mg total) by mouth 2 (two) times daily as needed for muscle spasms. 08/01/23   White, Elizabeth A, PA-C  hydrOXYzine (ATARAX/VISTARIL) 25 MG tablet Take 1 tablet (25 mg total) by mouth every 6 (six) hours as needed for anxiety. 12/31/16   Oneta Rack, NP  ondansetron (ZOFRAN-ODT) 4 MG disintegrating tablet Take 1 tablet (4 mg total) by mouth every 8 (eight) hours as needed for nausea or vomiting. 08/14/23   Hughie Closs, MD  oxyCODONE (OXY IR/ROXICODONE) 5 MG immediate release tablet  Take 1 tablet (5 mg total) by mouth every 12 (twelve) hours as needed for up to 10 doses for moderate pain (pain score 4-6). 08/14/23   Hughie Closs, MD  predniSONE (STERAPRED UNI-PAK 21 TAB) 10 MG (21) TBPK tablet Take by mouth daily. Take 6 tabs by mouth daily for 2 days, then 5 tabs for 2 days, then 4 tabs for 2 days, then 3 tabs for 2 days, 2 tabs for 2 days, then 1 tab by mouth daily for 2 days 08/01/23   Doristine Mango A, PA-C  tretinoin (RETIN-A) 0.025 % cream Apply 1 Application topically at bedtime. 07/16/15   [provider]  triamcinolone cream (KENALOG) 0.5 % Apply 1 Application topically 3 (three) times daily.    [provider]  TRINTELLIX 20 MG TABS tablet Take 20 mg by mouth daily. 10/24/19   [provider]      Allergies    Azithromycin    Review of Systems   Review of Systems  Respiratory:  Negative for shortness of breath.   Cardiovascular:  Negative for chest pain.  Genitourinary:  Positive for vaginal discharge.    Physical Exam Updated Vital Signs BP 117/77 (BP Location: Left Arm)   Pulse 98   Temp 98.1 F (36.7 C) (Oral)   Resp 18   Ht 5\' 6"  (1.676 m)   Wt 86 kg   LMP 07/31/2023 Comment: negative pregnancy test  SpO2 100%   BMI 30.60  kg/m  Physical Exam Vitals and nursing note reviewed.  Constitutional:      General: She is not in acute distress.    Appearance: She is well-developed.  HENT:     Head: Normocephalic and atraumatic.  Eyes:     Conjunctiva/sclera: Conjunctivae normal.  Cardiovascular:     Rate and Rhythm: Normal rate and regular rhythm.     Heart sounds: No murmur heard. Pulmonary:     Effort: Pulmonary effort is normal. No respiratory distress.     Breath sounds: Normal breath sounds.  Abdominal:     Palpations: Abdomen is soft.     Tenderness: There is no abdominal tenderness.  Musculoskeletal:        General: No swelling.     Cervical back: Neck supple.  Skin:    General: Skin is warm and dry.      Capillary Refill: Capillary refill takes less than 2 seconds.  Neurological:     Mental Status: She is alert.  Psychiatric:        Mood and Affect: Mood normal.     ED Results / Procedures / Treatments   Labs (all labs ordered are listed, but only abnormal results are displayed) Labs Reviewed  URINALYSIS, ROUTINE W REFLEX MICROSCOPIC - Abnormal; Notable for the following components:      Result Value   Color, Urine STRAW (*)    Hgb urine dipstick Abbagail Scaff (*)    Leukocytes,Ua MODERATE (*)    Bacteria, UA RARE (*)    All other components within normal limits  LIPASE, BLOOD - Abnormal; Notable for the following components:   Lipase 106 (*)    All other components within normal limits  COMPREHENSIVE METABOLIC PANEL - Abnormal; Notable for the following components:   Creatinine, Ser 1.45 (*)    GFR, Estimated 50 (*)    All other components within normal limits  CBC - Abnormal; Notable for the following components:   RBC 3.07 (*)    Hemoglobin 8.7 (*)    HCT 27.1 (*)    Platelets 1,040 (*)    All other components within normal limits  WET PREP, GENITAL  CULTURE, BLOOD (ROUTINE X 2)  CULTURE, BLOOD (ROUTINE X 2)  HCG, SERUM, QUALITATIVE  OSMOLALITY, URINE  OSMOLALITY  SODIUM, URINE, RANDOM  CBC WITH DIFFERENTIAL/PLATELET  CK  MAGNESIUM  PHOSPHORUS  TSH  PREALBUMIN  CREATININE, URINE, RANDOM  GC/CHLAMYDIA PROBE AMP ([place]) NOT AT [facility]    EKG None  Radiology CT ABDOMEN PELVIS WO CONTRAST Result Date: 08/24/2023 CLINICAL DATA:  Abnormal white count EXAM: CT ABDOMEN AND PELVIS WITHOUT CONTRAST TECHNIQUE: Multidetector CT imaging of the abdomen and pelvis was performed following the standard protocol without IV contrast. RADIATION DOSE REDUCTION: This exam was performed according to the departmental dose-optimization program which includes automated exposure control, adjustment of the mA and/or kV according to patient size and/or use of iterative reconstruction  technique. COMPARISON:  CT 08/10/2023 FINDINGS: Lower chest: No acute abnormality. Clearing of pleural effusions and lower lobe consolidations. Hepatobiliary: Contracted gallbladder. No calcified stone or biliary dilatation Pancreas: Unremarkable. No pancreatic ductal dilatation or surrounding inflammatory changes. Spleen: Normal in size without focal abnormality. Adrenals/Urinary Tract: Adrenal glands are within normal limits. Chau Sawin nonobstructing kidney stones. Mild perinephric edema on the right. The bladder is unremarkable Stomach/Bowel: Stomach is within normal limits. Appendix appears normal. No evidence of bowel wall thickening, distention, or inflammatory changes. Vascular/Lymphatic: No significant vascular findings are present. No enlarged abdominal or pelvic lymph nodes.  Reproductive: Uterus and bilateral adnexa are unremarkable. Other: No abdominal wall hernia or abnormality. No abdominopelvic ascites. Musculoskeletal: No acute or significant osseous findings. IMPRESSION: 1. Mild perinephric edema on the right, possible pyelonephritis. No hydronephrosis. Alik Mawson nonobstructing kidney stones. 2. Clearing of pleural effusions and lower lobe consolidations. Electronically Signed   By: Jasmine Pang M.D.   On: 08/24/2023 21:15   DG Chest 2 View Result Date: 08/24/2023 CLINICAL DATA:  Abnormal white count EXAM: CHEST - 2 VIEW COMPARISON:  08/14/2023 FINDINGS: The heart size and mediastinal contours are within normal limits. Both lungs are clear. The visualized skeletal structures are unremarkable. IMPRESSION: No active cardiopulmonary disease. Electronically Signed   By: Jasmine Pang M.D.   On: 08/24/2023 21:07    Procedures Procedures    Medications Ordered in ED Medications  sodium chloride 0.9 % bolus 2,000 mL (has no administration in time range)  piperacillin-tazobactam (ZOSYN) IVPB 3.375 g (0 g Intravenous Stopped 08/24/23 2158)    ED Course/ Medical Decision Making/ A&P                                  Medical Decision Making Patient is a 27 year old female, here previously for pyelonephritis with severe sepsis.  She states she is having similar symptoms, as last time, and is what prompted her, and, as well as she has a high platelet count, and was told to come in by her PCP.  She denies any abdominal pain, nausea, vomiting currently.  No known fevers  Amount and/or Complexity of Data Reviewed Labs: ordered.    Details: Platelets 1000+, no leukocytosis, urinalysis concerning for UTI, with rare bacteria, moderate leukocytes Radiology: ordered.    Details: CT abdomen pelvis shows pyelonephritis, with no evidence of abscess Discussion of management or test interpretation with external provider(s): Patient recently had stay, for severe pyelonephritis and severe sepsis, was recently discharged on 12/3, is now having symptoms recurrently, and she states that it feels like last time.  She is not tachycardic, or febrile currently.  However given her recent admission, for severe sepsis secondary to pyelonephritis, we will be aggressive in management, start Zosyn, obtain blood cultures, give fluids.  Admitted to Dr. Adela Glimpse for further management.  Thrombocytosis, is thought to be secondary to infection, however inpatient will further evaluate  Risk Prescription drug management. Decision regarding hospitalization.    Final Clinical Impression(s) / ED Diagnoses Final diagnoses:  Pyelonephritis  Thrombocytosis    Rx / DC Orders ED Discharge Orders     None         Tracia Lacomb Elbert Ewings, PA 08/24/23 2320    Coral Spikes, DO 08/24/23 2350

## 2023-08-24 NOTE — ED Notes (Signed)
Patient transported to CT 

## 2023-08-24 NOTE — ED Triage Notes (Signed)
Pt arrived reporting she was recently d/c from inpatient stay. Admitted for pyelonephritis. Went for f/u and was told she had abnormal white count and platelet count. States still has blood in urine.

## 2023-08-24 NOTE — Subjective & Objective (Addendum)
  Patient had recent admission for pyelonephritis here with similar symptoms Recent pyelonephritis was told that she has abnormal white count and platelet count and told to go to emergency department continues to have blood in urine Started on IV Zosyn and blood cultures obtained

## 2023-08-24 NOTE — ED Notes (Signed)
Lab was called to add urine culture and other labs to urine that was collected earlier.

## 2023-08-24 NOTE — ED Notes (Signed)
Orders for blood cultures placed after the start of IV Abx zosyn. Pt with poor vasculature. Was stuck 5 times prior to this RNs arrival. IV to left hand infusing IV zosyn. First blood culture collected from right hand. Unable to collect second set of blood cutlures. PA informed.

## 2023-08-24 NOTE — H&P (Incomplete)
Renee Mcdowell ZOX:[id number] DOB: June 09, 1996 DOA: 08/24/2023     PCP: [name], MD   Outpatient Specialists:  CARDS:  Dr. [name], MD     Patient arrived to ER on 08/24/23 at 1708 Referred by Attending Coral Spikes, DO   Patient coming from:    home Lives   With roomate    Chief Complaint:   Chief Complaint  Patient presents with   abnormal labs    HPI: Renee Mcdowell is a 27 y.o. female with medical history significant of pyelonephritis, orthostatic syncope, depression, anxiety, and ADHD     Presented with   back pain shoulder pain   Patient had recent admission for pyelonephritis Recent pyelonephritis was told that she has abnormal white count and platelet count and told to go to emergency department continues to have blood in urine Started on IV Zosyn and blood cultures obtained    Denies heavy menses, some blood on toilet paper thought it was UTI   Denies significant ETOH intake   Does not smoke   No results found for: "SARSCOV2NAA"      Regarding pertinent Chronic problems:        Chronic anemia - baseline hg Hemoglobin & Hematocrit  Recent Labs    08/12/23 0642 08/14/23 1043 08/24/23 1740  HGB 9.3* 8.6* 8.7*   Iron/TIBC/Ferritin/ %Sat    Component Value Date/Time   IRON 59 08/12/2023 0457   TIBC 179 (L) 08/12/2023 0457   FERRITIN 217 08/12/2023 0457   IRONPCTSAT 33 (H) 08/12/2023 0457     While in ER:    Ct shoed pyelonephritis     Lab Orders         Wet prep, genital         Blood culture (routine x 2)         Urinalysis, Routine w reflex microscopic -Urine, Clean Catch         hCG, serum, qualitative         Lipase, blood         Comprehensive metabolic panel         CBC       CXR -  NON acute  CTabd/pelvis -  Mild perinephric edema on the right, possible pyelonephritis. No hydronephrosis. Small nonobstructing kidney stones. 2. Clearing of pleural effusions and lower lobe consolidations.     Following Medications were ordered in ER: Medications  piperacillin-tazobactam (ZOSYN) IVPB 3.375 g (0 g Intravenous Stopped 08/24/23 2158)       ED Triage Vitals  Encounter Vitals Group     BP 08/24/23 1716 132/88     Systolic BP Percentile --      Diastolic BP Percentile --      Pulse Rate 08/24/23 1716 99     Resp 08/24/23 1716 18     Temp 08/24/23 1716 98.3 F (36.8 C)     Temp Source 08/24/23 1716 Oral     SpO2 08/24/23 1716 100 %     Weight 08/24/23 1724 189 lb 9.5 oz (86 kg)     Height 08/24/23 1724 5\' 6"  (1.676 m)     Head Circumference --      Peak Flow --      Pain Score 08/24/23 1724 0     Pain Loc --      Pain Education --      Exclude from Growth Chart --   WJXB(14)@     _________________________________________ Significant initial  Findings:  Abnormal Labs Reviewed  URINALYSIS, ROUTINE W REFLEX MICROSCOPIC - Abnormal; Notable for the following components:      Result Value   Color, Urine STRAW (*)    Hgb urine dipstick SMALL (*)    Leukocytes,Ua MODERATE (*)    Bacteria, UA RARE (*)    All other components within normal limits  LIPASE, BLOOD - Abnormal; Notable for the following components:   Lipase 106 (*)    All other components within normal limits  COMPREHENSIVE METABOLIC PANEL - Abnormal; Notable for the following components:   Creatinine, Ser 1.45 (*)    GFR, Estimated 50 (*)    All other components within normal limits  CBC - Abnormal; Notable for the following components:   RBC 3.07 (*)    Hemoglobin 8.7 (*)    HCT 27.1 (*)    Platelets 1,040 (*)    All other components within normal limits      _________________________ Troponin ***ordered Cardiac Panel (last 3 results) No results for input(s): "CKTOTAL", "CKMB", "TROPONINIHS", "RELINDX" in the last 72 hours.   ECG: Ordered Personally reviewed and interpreted by me showing: HR : *** Rhythm: *NSR, Sinus tachycardia * A.fib. W RVR, RBBB, LBBB, Paced Ischemic changes*nonspecific  changes, no evidence of ischemic changes QTC*  __________   The recent clinical data is shown below. Vitals:   08/24/23 1724 08/24/23 1925 08/24/23 1926 08/24/23 2239  BP:  (!) 141/98  117/77  Pulse:  79 81 98  Resp:  18  18  Temp:  98.3 F (36.8 C)    TempSrc:  Oral    SpO2:  100% 100% 100%  Weight: 86 kg     Height: 5\' 6"  (1.676 m)       WBC     Component Value Date/Time   WBC 8.7 08/24/2023 1740   LYMPHSABS 1.5 08/14/2023 1043   MONOABS 1.3 (H) 08/14/2023 1043   EOSABS 0.3 08/14/2023 1043   BASOSABS 0.0 08/14/2023 1043     Lactic Acid, Venous    Component Value Date/Time   LATICACIDVEN 1.4 08/08/2023 1935      Procalcitonin *** Ordered      UA   evidence of UTI     Urine analysis:    Component Value Date/Time   COLORURINE STRAW (A) 08/24/2023 1923   APPEARANCEUR CLEAR 08/24/2023 1923   LABSPEC 1.005 08/24/2023 1923   PHURINE 6.0 08/24/2023 1923   GLUCOSEU NEGATIVE 08/24/2023 1923   HGBUR SMALL (A) 08/24/2023 1923   BILIRUBINUR NEGATIVE 08/24/2023 1923   KETONESUR NEGATIVE 08/24/2023 1923   PROTEINUR NEGATIVE 08/24/2023 1923   NITRITE NEGATIVE 08/24/2023 1923   LEUKOCYTESUR MODERATE (A) 08/24/2023 1923    Results for orders placed or performed during the hospital encounter of 08/24/23  Wet prep, genital     Status: None   Collection Time: 08/24/23  7:46 PM   Specimen: PATH Cytology Cervicovaginal Ancillary Only  Result Value Ref Range Status   Yeast Wet Prep HPF POC NONE SEEN NONE SEEN Final   Trich, Wet Prep NONE SEEN NONE SEEN Final   Clue Cells Wet Prep HPF POC NONE SEEN NONE SEEN Final   WBC, Wet Prep HPF POC <10 <10 Final    Comment: Swab received with less than 0.5 mL of saline, saline added to specimen, interpret results with caution.   Sperm NONE SEEN  Final    Comment: Performed at [facility], [address] [place], [place] [address]    ABX started Antibiotics  Given (last 72 hours)     Date/Time Action  Medication Dose Rate   08/24/23 2116 New Bag/Given   piperacillin-tazobactam (ZOSYN) IVPB 3.375 g 3.375 g 100 mL/hr       Susceptibility data from last 90 days. Collected Specimen Info Organism AMPICILLIN AMPICILLIN/SULBACTAM CEFAZOLIN CEFEPIME Ceftazidime CEFTRIAXONE Ciprofloxacin Gentamicin Susc lslt Imipenem Piperacillin + Tazobactam Trimethoprim/Sulfa  08/08/23 Blood from BLOOD Escherichia coli (ZZ00)    S            Escherichia coli (ZZ01)  S  S   S  S  S  S  S  S  S  S    __________________________________________________________ Recent Labs  Lab 08/24/23 1740  NA 138  K 3.6  CO2 22  GLUCOSE 89  BUN 12  CREATININE 1.45*  CALCIUM 9.2    Cr    Up from baseline see below Lab Results  Component Value Date   CREATININE 1.45 (H) 08/24/2023   CREATININE 1.81 (H) 08/14/2023   CREATININE 2.00 (H) 08/13/2023    Recent Labs  Lab 08/24/23 1740  AST 15  ALT 14  ALKPHOS 91  BILITOT 0.6  PROT 8.0  ALBUMIN 3.5   Lab Results  Component Value Date   CALCIUM 9.2 08/24/2023   PHOS 2.5 08/09/2023    Plt: Lab Results  Component Value Date   PLT 1,040 (HH) 08/24/2023       Recent Labs  Lab 08/24/23 1740  WBC 8.7  HGB 8.7*  HCT 27.1*  MCV 88.3  PLT 1,040*    HG/HCT   ,  Down   from baseline see below    Component Value Date/Time   HGB 8.7 (L) 08/24/2023 1740   HCT 27.1 (L) 08/24/2023 1740   MCV 88.3 08/24/2023 1740     Recent Labs  Lab 08/24/23 1740  LIPASE 106*    _______________________________________________ Hospitalist was called for admission for   Pyelonephritis  Thrombocytosis     The following Work up has been ordered so far:  Orders Placed This Encounter  Procedures   Wet prep, genital   Blood culture (routine x 2)   DG Chest 2 View   CT ABDOMEN PELVIS WO CONTRAST   Urinalysis, Routine w reflex microscopic -Urine, Clean Catch   hCG, serum, qualitative   Lipase, blood   Comprehensive metabolic panel   CBC   Diet NPO time specified    Consult to hospitalist     OTHER Significant initial  Findings:  labs showing:     DM  labs:  HbA1C: No results for input(s): "HGBA1C" in the last 8760 hours.     CBG (last 3)  No results for input(s): "GLUCAP" in the last 72 hours.        Cultures:    Component Value Date/Time   SDES  08/08/2023 1935    URINE, CLEAN CATCH Performed at [facility], [address], [place], [place] [address]    [facility]  08/08/2023 1935    NONE Performed at [facility], [address], [place], [place] [address]    CULT (A) 08/08/2023 1935    <10,000 COLONIES/mL INSIGNIFICANT GROWTH Performed at [facility], [address], [place], [place] [address]    REPTSTATUS 08/10/2023 FINAL 08/08/2023 1935     Radiological Exams on Admission: CT ABDOMEN PELVIS WO CONTRAST Result Date: 08/24/2023 CLINICAL DATA:  Abnormal white count EXAM: CT ABDOMEN AND PELVIS WITHOUT CONTRAST TECHNIQUE: Multidetector CT imaging of the abdomen and pelvis  was performed following the standard protocol without IV contrast. RADIATION DOSE REDUCTION: This exam was performed according to the departmental dose-optimization program which includes automated exposure control, adjustment of the mA and/or kV according to patient size and/or use of iterative reconstruction technique. COMPARISON:  CT 08/10/2023 FINDINGS: Lower chest: No acute abnormality. Clearing of pleural effusions and lower lobe consolidations. Hepatobiliary: Contracted gallbladder. No calcified stone or biliary dilatation Pancreas: Unremarkable. No pancreatic ductal dilatation or surrounding inflammatory changes. Spleen: Normal in size without focal abnormality. Adrenals/Urinary Tract: Adrenal glands are within normal limits. Small nonobstructing kidney stones. Mild perinephric edema on the right. The bladder is unremarkable Stomach/Bowel: Stomach is within normal limits. Appendix appears normal. No evidence of  bowel wall thickening, distention, or inflammatory changes. Vascular/Lymphatic: No significant vascular findings are present. No enlarged abdominal or pelvic lymph nodes. Reproductive: Uterus and bilateral adnexa are unremarkable. Other: No abdominal wall hernia or abnormality. No abdominopelvic ascites. Musculoskeletal: No acute or significant osseous findings. IMPRESSION: 1. Mild perinephric edema on the right, possible pyelonephritis. No hydronephrosis. Small nonobstructing kidney stones. 2. Clearing of pleural effusions and lower lobe consolidations. Electronically Signed   By: Jasmine Pang M.D.   On: 08/24/2023 21:15   DG Chest 2 View Result Date: 08/24/2023 CLINICAL DATA:  Abnormal white count EXAM: CHEST - 2 VIEW COMPARISON:  08/14/2023 FINDINGS: The heart size and mediastinal contours are within normal limits. Both lungs are clear. The visualized skeletal structures are unremarkable. IMPRESSION: No active cardiopulmonary disease. Electronically Signed   By: Jasmine Pang M.D.   On: 08/24/2023 21:07   _______________________________________________________________________________________________________ Latest  Blood pressure 117/77, pulse 98, temperature 98.3 F (36.8 C), temperature source Oral, resp. rate 18, height 5\' 6"  (1.676 m), weight 86 kg, last menstrual period 07/31/2023, SpO2 100%.   Vitals  labs and radiology finding personally reviewed  Review of Systems:    Pertinent positives include:  Fevers, chills,back pain.   Constitutional:  No weight loss, night sweats,  fatigue, weight loss  HEENT:  No headaches, Difficulty swallowing,Tooth/dental problems,Sore throat,  No sneezing, itching, ear ache, nasal congestion, post nasal drip,  Cardio-vascular:  No chest pain, Orthopnea, PND, anasarca, dizziness, palpitations.no Bilateral lower extremity swelling  GI:  No heartburn, indigestion, abdominal pain, nausea, vomiting, diarrhea, change in bowel habits, loss of appetite, melena,  blood in stool, hematemesis Resp:  no shortness of breath at rest. No dyspnea on exertion, No excess mucus, no productive cough, No non-productive cough, No coughing up of blood.No change in color of mucus.No wheezing. Skin:  no rash or lesions. No jaundice GU:  no dysuria, change in color of urine, no urgency or frequency. No straining to urinate.  No flank pain.  Musculoskeletal:  No joint pain or no joint swelling. No decreased range of motion. No  Psych:  No change in mood or affect. No depression or anxiety. No memory loss.  Neuro: no localizing neurological complaints, no tingling, no weakness, no double vision, no gait abnormality, no slurred speech, no confusion  All systems reviewed and apart from HOPI all are negative _______________________________________________________________________________________________ Past Medical History:   Past Medical History:  Diagnosis Date   ADHD (attention deficit hyperactivity disorder)    Anxiety    Depression    Major depressive disorder, recurrent episode, moderate (HCC)    Nicotine abuse    Obesity (BMI 30.0-34.9)    Psychiatric inpatient    Social phobia    Suicidal ideations       History reviewed. No pertinent surgical history.  Social History:  Ambulatory   independently      reports that she has quit smoking. Her smoking use included cigarettes. She has never used smokeless tobacco. She reports current alcohol use of about 2.0 standard drinks of alcohol per week. She reports that she does not use drugs.   Family History:   Family History  Problem Relation Age of Onset   Mental illness Father        Schizoaffective disorder   Thyroid disease Mother    Valvular heart disease Mother    ______________________________________________________________________________________________ Allergies: Allergies  Allergen Reactions   Azithromycin Hives     Prior to Admission medications   Medication Sig Start Date End  Date Taking? Authorizing Provider  amphetamine-dextroamphetamine (ADDERALL XR) 20 MG 24 hr capsule Take 20 mg by mouth daily.    [provider]  amphetamine-dextroamphetamine (ADDERALL) 20 MG tablet Take 20 mg by mouth 2 (two) times daily.    [provider]  atomoxetine (STRATTERA) 100 MG capsule Take 10 mg by mouth daily.    [provider]  cyclobenzaprine (FLEXERIL) 10 MG tablet Take 1 tablet (10 mg total) by mouth 2 (two) times daily as needed for muscle spasms. 08/01/23   White, Elizabeth A, PA-C  hydrOXYzine (ATARAX/VISTARIL) 25 MG tablet Take 1 tablet (25 mg total) by mouth every 6 (six) hours as needed for anxiety. 12/31/16   Oneta Rack, NP  ondansetron (ZOFRAN-ODT) 4 MG disintegrating tablet Take 1 tablet (4 mg total) by mouth every 8 (eight) hours as needed for nausea or vomiting. 08/14/23   Hughie Closs, MD  oxyCODONE (OXY IR/ROXICODONE) 5 MG immediate release tablet Take 1 tablet (5 mg total) by mouth every 12 (twelve) hours as needed for up to 10 doses for moderate pain (pain score 4-6). 08/14/23   Hughie Closs, MD  predniSONE (STERAPRED UNI-PAK 21 TAB) 10 MG (21) TBPK tablet Take by mouth daily. Take 6 tabs by mouth daily for 2 days, then 5 tabs for 2 days, then 4 tabs for 2 days, then 3 tabs for 2 days, 2 tabs for 2 days, then 1 tab by mouth daily for 2 days 08/01/23   Doristine Mango A, PA-C  tretinoin (RETIN-A) 0.025 % cream Apply 1 Application topically at bedtime. 07/16/15   [provider]  triamcinolone cream (KENALOG) 0.5 % Apply 1 Application topically 3 (three) times daily.    [provider]  TRINTELLIX 20 MG TABS tablet Take 20 mg by mouth daily. 10/24/19   [provider]    ___________________________________________________________________________________________________ Physical Exam:    08/24/2023   10:39 PM 08/24/2023    7:26 PM 08/24/2023    7:25 PM  Vitals with BMI  Systolic 117  141  Diastolic 77  98   Pulse 98 81 79     1. General:  in No ***Acute distress***increased work of breathing ***complaining of severe pain****agitated * Chronically ill *well *cachectic *toxic acutely ill -appearing 2. Psychological: Alert and *** Oriented 3. Head/ENT:   Moist *** Dry Mucous Membranes                          Head Non traumatic, neck supple                          Normal *** Poor Dentition 4. SKIN: normal *** decreased Skin turgor,  Skin clean Dry and intact no rash    5. Heart:  Regular rate and rhythm no*** Murmur, no Rub or gallop 6. Lungs: ***Clear to auscultation bilaterally, no wheezes or crackles   7. Abdomen: Soft, ***non-tender, Non distended *** obese ***bowel sounds present 8. Lower extremities: no clubbing, cyanosis, no ***edema 9. Neurologically Grossly intact, moving all 4 extremities equally *** strength 5 out of 5 in all 4 extremities cranial nerves II through XII intact 10. MSK: Normal range of motion    Chart has been reviewed  ______________________________________________________________________________________________  Assessment/Plan  ***  Admitted for   Pyelonephritis  Thrombocytosis    Present on Admission: **None**     No problem-specific Assessment & Plan notes found for this encounter.    Other plan as per orders.  DVT prophylaxis:  SCD      Code Status:    Code Status: Prior FULL CODE  as per patient   I had personally discussed CODE STATUS with patient and family  ACP   none     Family Communication:   Family  at  Bedside  plan of care was discussed   with mother  Diet  Diet Orders (From admission, onward)     Start     Ordered   08/24/23 1732  Diet NPO time specified  Diet effective now        08/24/23 1732            Disposition Plan:   *** likely will need placement for rehabilitation                          Back to current facility when stable                            To home once workup is complete and patient is  stable  ***Following barriers for discharge:                             Chest pain *** Stroke *** work up is complete                            Electrolytes corrected                               Anemia corrected h/H stable                             Pain controlled with PO medications                               Afebrile, white count improving able to transition to PO antibiotics                             Will need to be able to tolerate PO                            Will likely need home health, home O2, set up                           Will need consultants to evaluate patient prior  to discharge       Consult Orders  (From admission, onward)           Start     Ordered   08/24/23 2133  Consult to hospitalist  Once       Provider:  (Not yet assigned)  Question Answer Comment  Place call to: [facility]   Reason for Consult Admit      08/24/23 2132                              ***Would benefit from PT/OT eval prior to DC  Ordered                   Swallow eval - SLP ordered                   Diabetes care coordinator                   Transition of care consulted                   Nutrition    consulted                  Wound care  consulted                   Palliative care    consulted                   Behavioral health  consulted                    Consults called: ***     Admission status:  ED Disposition     ED Disposition  Admit   Condition  --   Comment  The patient appears reasonably stabilized for admission considering the current resources, flow, and capabilities available in the ED at this time, and I doubt any other [facility] requiring further screening and/or treatment in the ED prior to admission is  present.           Obs***  ***  inpatient     I Expect 2 midnight stay secondary to severity of patient's current illness need for inpatient interventions justified by the following: ***hemodynamic instability despite optimal  treatment (tachycardia *hypotension * tachypnea *hypoxia, hypercapnia) * Severe lab/radiological/exam abnormalities including:     and extensive comorbidities including: *substance abuse  *Chronic pain *DM2  * CHF * CAD  * COPD/asthma *Morbid Obesity * CKD *dementia *liver disease *history of stroke with residual deficits *  malignancy, * sickle cell disease  History of amputation Chronic anticoagulation  That are currently affecting medical management.   I expect  patient to be hospitalized for 2 midnights requiring inpatient medical care.  Patient is at high risk for adverse outcome (such as loss of life or disability) if not treated.  Indication for inpatient stay as follows:  Severe change from baseline regarding mental status Hemodynamic instability despite maximal medical therapy,  ongoing suicidal ideations,  severe pain requiring acute inpatient management,  inability to maintain oral hydration   persistent chest pain despite medical management Need for operative/procedural  intervention New or worsening hypoxia   Need for IV antibiotics, IV fluids, IV rate controling medications, IV antihypertensives, IV pain medications, IV anticoagulation, need for biPAP    Level of care   *** tele  For 12H 24H     medical  floor       progressive     stepdown   tele indefinitely please discontinue once patient no longer qualifies COVID-19 Labs    No results found for: "SARSCOV2NAA"   Precautions: admitted as *** Covid Negative  ***asymptomatic screening protocol****PUI *** covid positive No active isolations ***If Covid PCR is negative  - please DC precautions - would need additional investigation given very high risk for false native test result    Critical***  Patient is critically ill due to  hemodynamic instability * respiratory failure *severe sepsis* ongoing chest pain*  They are at high risk for life/limb threatening clinical deterioration requiring frequent  reassessment and modifications of care.  Services provided include examination of the patient, review of relevant ancillary tests, prescription of lifesaving therapies, review of medications and prophylactic therapy.  Total critical care time excluding separately billable procedures: 60*  Minutes.    Yocelin Vanlue 08/24/2023, 10:42 PM ***  [facility]     after 2 AM please page floor coverage PA If 7AM-7PM, please contact the day team taking care of the patient using [email or web address]

## 2023-08-25 ENCOUNTER — Inpatient Hospital Stay (HOSPITAL_COMMUNITY): Payer: BC Managed Care – PPO

## 2023-08-25 ENCOUNTER — Encounter (HOSPITAL_COMMUNITY): Payer: Self-pay | Admitting: Internal Medicine

## 2023-08-25 DIAGNOSIS — D500 Iron deficiency anemia secondary to blood loss (chronic): Secondary | ICD-10-CM | POA: Diagnosis not present

## 2023-08-25 DIAGNOSIS — R4182 Altered mental status, unspecified: Secondary | ICD-10-CM | POA: Diagnosis not present

## 2023-08-25 DIAGNOSIS — D649 Anemia, unspecified: Secondary | ICD-10-CM | POA: Diagnosis present

## 2023-08-25 DIAGNOSIS — D75839 Thrombocytosis, unspecified: Secondary | ICD-10-CM | POA: Diagnosis present

## 2023-08-25 DIAGNOSIS — R7881 Bacteremia: Secondary | ICD-10-CM | POA: Diagnosis present

## 2023-08-25 DIAGNOSIS — K921 Melena: Secondary | ICD-10-CM | POA: Diagnosis not present

## 2023-08-25 DIAGNOSIS — N12 Tubulo-interstitial nephritis, not specified as acute or chronic: Secondary | ICD-10-CM | POA: Diagnosis not present

## 2023-08-25 DIAGNOSIS — R9431 Abnormal electrocardiogram [ECG] [EKG]: Secondary | ICD-10-CM | POA: Diagnosis present

## 2023-08-25 LAB — CBC WITH DIFFERENTIAL/PLATELET
Abs Immature Granulocytes: 0.03 10*3/uL (ref 0.00–0.07)
Basophils Absolute: 0.1 10*3/uL (ref 0.0–0.1)
Basophils Relative: 1 %
Eosinophils Absolute: 0.2 10*3/uL (ref 0.0–0.5)
Eosinophils Relative: 3 %
HCT: 25.2 % — ABNORMAL LOW (ref 36.0–46.0)
Hemoglobin: 7.9 g/dL — ABNORMAL LOW (ref 12.0–15.0)
Immature Granulocytes: 0 %
Lymphocytes Relative: 22 %
Lymphs Abs: 1.9 10*3/uL (ref 0.7–4.0)
MCH: 28 pg (ref 26.0–34.0)
MCHC: 31.3 g/dL (ref 30.0–36.0)
MCV: 89.4 fL (ref 80.0–100.0)
Monocytes Absolute: 0.6 10*3/uL (ref 0.1–1.0)
Monocytes Relative: 7 %
Neutro Abs: 5.9 10*3/uL (ref 1.7–7.7)
Neutrophils Relative %: 67 %
Platelets: 941 10*3/uL (ref 150–400)
RBC: 2.82 MIL/uL — ABNORMAL LOW (ref 3.87–5.11)
RDW: 13.8 % (ref 11.5–15.5)
WBC: 8.8 10*3/uL (ref 4.0–10.5)
nRBC: 0 % (ref 0.0–0.2)

## 2023-08-25 LAB — PHOSPHORUS: Phosphorus: 3 mg/dL (ref 2.5–4.6)

## 2023-08-25 LAB — COMPREHENSIVE METABOLIC PANEL
ALT: 11 U/L (ref 0–44)
AST: 13 U/L — ABNORMAL LOW (ref 15–41)
Albumin: 2.7 g/dL — ABNORMAL LOW (ref 3.5–5.0)
Alkaline Phosphatase: 68 U/L (ref 38–126)
Anion gap: 7 (ref 5–15)
BUN: 8 mg/dL (ref 6–20)
CO2: 21 mmol/L — ABNORMAL LOW (ref 22–32)
Calcium: 7.8 mg/dL — ABNORMAL LOW (ref 8.9–10.3)
Chloride: 111 mmol/L (ref 98–111)
Creatinine, Ser: 1.17 mg/dL — ABNORMAL HIGH (ref 0.44–1.00)
GFR, Estimated: >60 mL/min (ref >60)
Glucose, Bld: 96 mg/dL (ref 70–99)
Potassium: 3.5 mmol/L (ref 3.5–5.1)
Sodium: 139 mmol/L (ref 135–145)
Total Bilirubin: 0.4 mg/dL (ref <1.2)
Total Protein: 6.1 g/dL — ABNORMAL LOW (ref 6.5–8.1)

## 2023-08-25 LAB — CBC
HCT: 24.1 % — ABNORMAL LOW (ref 36.0–46.0)
HCT: 23.6 % — ABNORMAL LOW (ref 36.0–46.0)
Hemoglobin: 7.3 g/dL — ABNORMAL LOW (ref 12.0–15.0)
Hemoglobin: 7.5 g/dL — ABNORMAL LOW (ref 12.0–15.0)
MCH: 27.4 pg (ref 26.0–34.0)
MCH: 28.3 pg (ref 26.0–34.0)
MCHC: 30.3 g/dL (ref 30.0–36.0)
MCHC: 31.8 g/dL (ref 30.0–36.0)
MCV: 90.6 fL (ref 80.0–100.0)
MCV: 89.1 fL (ref 80.0–100.0)
Platelets: 879 10*3/uL — ABNORMAL HIGH (ref 150–400)
Platelets: 759 10*3/uL — ABNORMAL HIGH (ref 150–400)
RBC: 2.66 MIL/uL — ABNORMAL LOW (ref 3.87–5.11)
RBC: 2.65 MIL/uL — ABNORMAL LOW (ref 3.87–5.11)
RDW: 14.1 % (ref 11.5–15.5)
RDW: 14.2 % (ref 11.5–15.5)
WBC: 8.3 10*3/uL (ref 4.0–10.5)
WBC: 9.5 10*3/uL (ref 4.0–10.5)
nRBC: 0 % (ref 0.0–0.2)
nRBC: 0 % (ref 0.0–0.2)

## 2023-08-25 LAB — IRON AND TIBC
Iron: 22 ug/dL — ABNORMAL LOW (ref 28–170)
Saturation Ratios: 8 % — ABNORMAL LOW (ref 10.4–31.8)
TIBC: 294 ug/dL (ref 250–450)
UIBC: 272 ug/dL

## 2023-08-25 LAB — TYPE AND SCREEN
ABO/RH(D): A POS
Antibody Screen: NEGATIVE

## 2023-08-25 LAB — I-STAT CG4 LACTIC ACID, ED
Lactic Acid, Venous: 2.2 mmol/L (ref 0.5–1.9)
Lactic Acid, Venous: 0.6 mmol/L (ref 0.5–1.9)

## 2023-08-25 LAB — PROTIME-INR
INR: 1.1 (ref 0.8–1.2)
Prothrombin Time: 14 s (ref 11.4–15.2)

## 2023-08-25 LAB — FOLATE: Folate: 6.1 ng/mL (ref >5.9)

## 2023-08-25 LAB — RETICULOCYTES
Immature Retic Fract: 11.1 % (ref 2.3–15.9)
RBC.: 3 MIL/uL — ABNORMAL LOW (ref 3.87–5.11)
Retic Count, Absolute: 35.1 10*3/uL (ref 19.0–186.0)
Retic Ct Pct: 1.2 % (ref 0.4–3.1)

## 2023-08-25 LAB — GC/CHLAMYDIA PROBE AMP (~~LOC~~) NOT AT ARMC
Chlamydia: NEGATIVE
Comment: NEGATIVE
Comment: NORMAL
Neisseria Gonorrhea: NEGATIVE

## 2023-08-25 LAB — OSMOLALITY: Osmolality: 294 mosm/kg (ref 275–295)

## 2023-08-25 LAB — CK: Total CK: 32 U/L — ABNORMAL LOW (ref 38–234)

## 2023-08-25 LAB — VITAMIN B12: Vitamin B-12: 968 pg/mL — ABNORMAL HIGH (ref 180–914)

## 2023-08-25 LAB — MAGNESIUM: Magnesium: 1.8 mg/dL (ref 1.7–2.4)

## 2023-08-25 LAB — ABO/RH: ABO/RH(D): A POS

## 2023-08-25 LAB — PROCALCITONIN: Procalcitonin: 0.17 ng/mL

## 2023-08-25 LAB — PREALBUMIN: Prealbumin: 18 mg/dL (ref 18–38)

## 2023-08-25 LAB — APTT: aPTT: 35 s (ref 24–36)

## 2023-08-25 LAB — FERRITIN: Ferritin: 177 ng/mL (ref 11–307)

## 2023-08-25 LAB — CREATININE, URINE, RANDOM: Creatinine, Urine: 50 mg/dL

## 2023-08-25 LAB — OSMOLALITY, URINE: Osmolality, Ur: 174 mosm/kg — ABNORMAL LOW (ref 300–900)

## 2023-08-25 LAB — SODIUM, URINE, RANDOM: Sodium, Ur: 27 mmol/L

## 2023-08-25 LAB — TSH: TSH: 0.415 u[IU]/mL (ref 0.350–4.500)

## 2023-08-25 MED ORDER — FERROUS SULFATE 325 (65 FE) MG PO TABS
325.0000 mg | ORAL_TABLET | Freq: Every day | ORAL | Status: DC
  Administered 2023-08-25 – 2023-08-27 (×3): 325 mg via ORAL
  Filled 2023-08-25 (×3): qty 1

## 2023-08-25 MED ORDER — HYDROCODONE-ACETAMINOPHEN 5-325 MG PO TABS: 1.0000 | ORAL_TABLET | ORAL | Status: DC | PRN

## 2023-08-25 MED ORDER — ACETAMINOPHEN 325 MG PO TABS: 650.0000 mg | ORAL_TABLET | Freq: Four times a day (QID) | ORAL | Status: DC | PRN

## 2023-08-25 MED ORDER — ORAL CARE MOUTH RINSE: 15.0000 mL | OROMUCOSAL | Status: DC | PRN

## 2023-08-25 MED ORDER — VORTIOXETINE HBR 5 MG PO TABS
20.0000 mg | ORAL_TABLET | Freq: Every day | ORAL | Status: DC
Start: 2023-08-25 — End: 2023-08-27
  Administered 2023-08-25 – 2023-08-27 (×3): 20 mg via ORAL
  Filled 2023-08-25 (×3): qty 4

## 2023-08-25 MED ORDER — MAGNESIUM SULFATE 2 GM/50ML IV SOLN
2.0000 g | Freq: Once | INTRAVENOUS | Status: AC
  Administered 2023-08-25: 2 g via INTRAVENOUS
  Filled 2023-08-25: qty 50

## 2023-08-25 MED ORDER — SODIUM CHLORIDE 0.9 % IV SOLN: INTRAVENOUS | Status: AC

## 2023-08-25 MED ORDER — ATOMOXETINE HCL 10 MG PO CAPS: 10.0000 mg | ORAL_CAPSULE | Freq: Every day | ORAL | Status: DC

## 2023-08-25 MED ORDER — ONDANSETRON HCL 4 MG/2ML IJ SOLN: 4.0000 mg | Freq: Four times a day (QID) | INTRAMUSCULAR | Status: DC | PRN

## 2023-08-25 MED ORDER — SODIUM CHLORIDE 0.9 % IV SOLN: INTRAVENOUS | Status: DC

## 2023-08-25 MED ORDER — THIAMINE HCL 100 MG/ML IJ SOLN
500.0000 mg | Freq: Every day | INTRAVENOUS | Status: AC
  Administered 2023-08-25 – 2023-08-27 (×3): 500 mg via INTRAVENOUS
  Filled 2023-08-25 (×3): qty 5

## 2023-08-25 MED ORDER — ONDANSETRON HCL 4 MG PO TABS: 4.0000 mg | ORAL_TABLET | Freq: Four times a day (QID) | ORAL | Status: DC | PRN

## 2023-08-25 MED ORDER — PANTOPRAZOLE SODIUM 40 MG IV SOLR
40.0000 mg | Freq: Two times a day (BID) | INTRAVENOUS | Status: DC
  Administered 2023-08-25 – 2023-08-27 (×5): 40 mg via INTRAVENOUS
  Filled 2023-08-25 (×5): qty 10

## 2023-08-25 MED ORDER — ATOMOXETINE HCL 40 MG PO CAPS
40.0000 mg | ORAL_CAPSULE | Freq: Every day | ORAL | Status: DC
  Administered 2023-08-25 – 2023-08-27 (×3): 40 mg via ORAL
  Filled 2023-08-25 (×3): qty 1

## 2023-08-25 MED ORDER — ACETAMINOPHEN 650 MG RE SUPP: 650.0000 mg | Freq: Four times a day (QID) | RECTAL | Status: DC | PRN

## 2023-08-25 MED ORDER — ATOMOXETINE HCL 60 MG PO CAPS
60.0000 mg | ORAL_CAPSULE | Freq: Every day | ORAL | Status: DC
  Administered 2023-08-25 – 2023-08-27 (×3): 60 mg via ORAL
  Filled 2023-08-25 (×3): qty 1

## 2023-08-25 NOTE — Assessment & Plan Note (Signed)
Renal function seems to be actually improving.  Will rehydrate and continue to follow obtain electrolytes CT showing no evidence of hydronephrosis

## 2023-08-25 NOTE — Assessment & Plan Note (Signed)
-  will monitor on tele avoid QT prolonging medications, rehydrate correct electrolytes ? ?

## 2023-08-25 NOTE — Assessment & Plan Note (Signed)
Currently denies.

## 2023-08-25 NOTE — Assessment & Plan Note (Signed)
Unclear etiology.  Obtain anemia panel Hemoccult stool transfuse as needed for hemoglobin below 7

## 2023-08-25 NOTE — Assessment & Plan Note (Signed)
Given tachycardia will hold home medications and observe

## 2023-08-25 NOTE — Assessment & Plan Note (Signed)
In the setting of recent thrombocytopenia.   May need to have follow-up with hematology oncology as an outpatient if does not improve could be in the setting of infection

## 2023-08-25 NOTE — Assessment & Plan Note (Signed)
Repeat blood cultures.  Obtain echogram

## 2023-08-25 NOTE — Assessment & Plan Note (Signed)
Currently states does not use cigarettes only vapes

## 2023-08-25 NOTE — Assessment & Plan Note (Signed)
Check vitamin B12 level patient endorses breakdown at the corners of her mouth.  She says that she have had history of B12 deficiency in the past

## 2023-08-25 NOTE — Consult Note (Signed)
Reason for Consult: History of melena a week ago and anemia Referring Physician: Hospital team  Renee Mcdowell is an 27 y.o. female.  HPI: Patient seen and examined and her case discussed with her friend at bedside her hospital computer chart reviewed and her main problem last week was pyelonephritis with some acute kidney injury and she seemed to be better but had some symptomatic anemia and was admitted for transfusion and had been on Advil and saw some black stools without iron or Pepto-Bismol and she has not had any previous GI workup and her family history is negative from a GI standpoint and she has no other complaints  Past Medical History:  Diagnosis Date  . ADHD (attention deficit hyperactivity disorder)   . Anxiety   . Depression   . Major depressive disorder, recurrent episode, moderate (HCC)   . Nicotine abuse   . Obesity (BMI 30.0-34.9)   . Psychiatric inpatient   . Social phobia   . Suicidal ideations     History reviewed. No pertinent surgical history.  Family History  Problem Relation Age of Onset  . Mental illness Father        Schizoaffective disorder  . Thyroid disease Mother   . Valvular heart disease Mother     Social History:  reports that she has quit smoking. Her smoking use included cigarettes. She has never used smokeless tobacco. She reports current alcohol use of about 2.0 standard drinks of alcohol per week. She reports that she does not use drugs.  Allergies:  Allergies  Allergen Reactions  . Azithromycin Hives    Medications: I have reviewed the patient's current medications.  Results for orders placed or performed during the hospital encounter of 08/24/23 (from the past 48 hours)  hCG, serum, qualitative     Status: None   Collection Time: 08/24/23  5:40 PM  Result Value Ref Range   Preg, Serum NEGATIVE NEGATIVE    Comment:        THE SENSITIVITY OF THIS METHODOLOGY IS >10 mIU/mL. Performed at [facility], [address] [place], [place] [address]   Lipase, blood     Status: Abnormal   Collection Time: 08/24/23  5:40 PM  Result Value Ref Range   Lipase 106 (H) 11 - 51 U/L    Comment: Performed at [facility], [address], [place], [place] [address]  Comprehensive metabolic panel     Status: Abnormal   Collection Time: 08/24/23  5:40 PM  Result Value Ref Range   Sodium 138 135 - 145 mmol/L   Potassium 3.6 3.5 - 5.1 mmol/L   Chloride 105 98 - 111 mmol/L   CO2 22 22 - 32 mmol/L   Glucose, Bld 89 70 - 99 mg/dL    Comment: Glucose reference range applies only to samples taken after fasting for at least 8 hours.   BUN 12 6 - 20 mg/dL   Creatinine, Ser 2.95 (H) 0.44 - 1.00 mg/dL   Calcium 9.2 8.9 - 62.1 mg/dL   Total Protein 8.0 6.5 - 8.1 g/dL   Albumin 3.5 3.5 - 5.0 g/dL   AST 15 15 - 41 U/L   ALT 14 0 - 44 U/L   Alkaline Phosphatase 91 38 - 126 U/L   Total Bilirubin 0.6 <1.2 mg/dL   GFR, Estimated 50 (L) >60 mL/min    Comment: (NOTE) Calculated using the CKD-EPI Creatinine Equation (2021)    Anion gap 11 5 - 15  Comment: Performed at [facility], [address] [place], [place] [address]  CBC     Status: Abnormal   Collection Time: 08/24/23  5:40 PM  Result Value Ref Range   WBC 8.7 4.0 - 10.5 K/uL   RBC 3.07 (L) 3.87 - 5.11 MIL/uL   Hemoglobin 8.7 (L) 12.0 - 15.0 g/dL   HCT 21.3 (L) 08.6 - 57.8 %   MCV 88.3 80.0 - 100.0 fL   MCH 28.3 26.0 - 34.0 pg   MCHC 32.1 30.0 - 36.0 g/dL   RDW 46.9 62.9 - 52.8 %   Platelets 1,040 (HH) 150 - 400 K/uL    Comment: This critical result has verified and been called to Malena Edman., RN by Pattricia Boss on 12 18 2024 at 1826, and has been read back.    nRBC 0.0 0.0 - 0.2 %    Comment: Performed at [facility], [address] [place], [place] [address]  Urinalysis, Routine w reflex microscopic -Urine, Clean Catch     Status: Abnormal   Collection Time: 08/24/23  7:23 PM  Result Value  Ref Range   Color, Urine STRAW (A) YELLOW   APPearance CLEAR CLEAR   Specific Gravity, Urine 1.005 1.005 - 1.030   pH 6.0 5.0 - 8.0   Glucose, UA NEGATIVE NEGATIVE mg/dL   Hgb urine dipstick SMALL (A) NEGATIVE   Bilirubin Urine NEGATIVE NEGATIVE   Ketones, ur NEGATIVE NEGATIVE mg/dL   Protein, ur NEGATIVE NEGATIVE mg/dL   Nitrite NEGATIVE NEGATIVE   Leukocytes,Ua MODERATE (A) NEGATIVE   RBC / HPF 0-5 0 - 5 RBC/hpf   WBC, UA 11-20 0 - 5 WBC/hpf   Bacteria, UA RARE (A) NONE SEEN   Squamous Epithelial / HPF 0-5 0 - 5 /HPF    Comment: Performed at [facility], [address], [place], [place] [address]  Wet prep, genital     Status: None   Collection Time: 08/24/23  7:46 PM   Specimen: PATH Cytology Cervicovaginal Ancillary Only  Result Value Ref Range   Yeast Wet Prep HPF POC NONE SEEN NONE SEEN   Trich, Wet Prep NONE SEEN NONE SEEN   Clue Cells Wet Prep HPF POC NONE SEEN NONE SEEN   WBC, Wet Prep HPF POC <10 <10    Comment: Swab received with less than 0.5 mL of saline, saline added to specimen, interpret results with caution.   Sperm NONE SEEN     Comment: Performed at [facility], [address], [place], [place] [address]  Blood culture (routine x 2)     Status: None (Preliminary result)   Collection Time: 08/24/23  9:57 PM   Specimen: BLOOD  Result Value Ref Range   Specimen Description      BLOOD BLOOD RIGHT HAND Performed at [facility], [address] [place], [place] [address]    Special Requests      BOTTLES DRAWN AEROBIC ONLY Blood Culture results may not be optimal due to an inadequate volume of blood received in culture bottles Performed at [facility], [address] [place], [place] [address]    Culture      NO GROWTH < 12 HOURS Performed at [facility], [address], [place], [place] [address]    Report Status PENDING   Osmolality, urine     Status: Abnormal    Collection Time: 08/24/23 11:18 PM  Result Value Ref Range  Osmolality, Ur 174 (L) 300 - 900 mOsm/kg    Comment: Performed at [facility], [address] [place], [place] [address]  Sodium, urine, random     Status: None   Collection Time: 08/24/23 11:18 PM  Result Value Ref Range   Sodium, Ur 27 mmol/L    Comment: Performed at [facility], [address], [place], [place] [address]  Creatinine, urine, random     Status: None   Collection Time: 08/24/23 11:18 PM  Result Value Ref Range   Creatinine, Urine 50 mg/dL    Comment: Performed at [facility], [address], [place], [place] [address]  Osmolality     Status: None   Collection Time: 08/25/23 12:50 AM  Result Value Ref Range   Osmolality 294 275 - 295 mOsm/kg    Comment: Performed at [facility], [address] [place], [place] [address]  CBC with Differential/Platelet     Status: Abnormal   Collection Time: 08/25/23 12:50 AM  Result Value Ref Range   WBC 8.8 4.0 - 10.5 K/uL   RBC 2.82 (L) 3.87 - 5.11 MIL/uL   Hemoglobin 7.9 (L) 12.0 - 15.0 g/dL   HCT 29.5 (L) 62.1 - 30.8 %   MCV 89.4 80.0 - 100.0 fL   MCH 28.0 26.0 - 34.0 pg   MCHC 31.3 30.0 - 36.0 g/dL   RDW 65.7 84.6 - 96.2 %   Platelets 941 (HH) 150 - 400 K/uL    Comment: REPEATED TO VERIFY THIS CRITICAL RESULT HAS VERIFIED AND BEEN CALLED TO W. [id number], RN BY KATIE DAVIS ON 12 19 2024 AT 0150, AND HAS BEEN READ BACK.     nRBC 0.0 0.0 - 0.2 %   Neutrophils Relative % 67 %   Neutro Abs 5.9 1.7 - 7.7 K/uL   Lymphocytes Relative 22 %   Lymphs Abs 1.9 0.7 - 4.0 K/uL   Monocytes Relative 7 %   Monocytes Absolute 0.6 0.1 - 1.0 K/uL   Eosinophils Relative 3 %   Eosinophils Absolute 0.2 0.0 - 0.5 K/uL   Basophils Relative 1 %   Basophils Absolute 0.1 0.0 - 0.1 K/uL   Immature Granulocytes 0 %   Abs Immature Granulocytes 0.03 0.00 - 0.07 K/uL    Comment: Performed at [facility], [address], [place], [place] [address]  TSH     Status: None   Collection Time: 08/25/23 12:50 AM  Result Value Ref Range   TSH 0.415 0.350 - 4.500 uIU/mL    Comment: Performed by a 3rd Generation assay with a functional sensitivity of <=0.01 uIU/mL. Performed at [facility], [address] [place], [place] [address]   Prealbumin     Status: None   Collection Time: 08/25/23 12:50 AM  Result Value Ref Range   Prealbumin 18 18 - 38 mg/dL    Comment: Performed at [facility], [address], [place], [place] [address]  Protime-INR     Status: None   Collection Time: 08/25/23 12:50 AM  Result Value Ref Range   Prothrombin Time 14.0 11.4 - 15.2 seconds   INR 1.1 0.8 - 1.2    Comment: (NOTE) INR goal varies based on device and disease states. Performed at [facility], [address] [place], [place] [address]   APTT     Status: None   Collection Time: 08/25/23 12:50 AM  Result Value Ref Range  aPTT 35 24 - 36 seconds    Comment: Performed at [facility], [address] [place], [place] [address]  Vitamin B12     Status: Abnormal   Collection Time: 08/25/23 12:50 AM  Result Value Ref Range   Vitamin B-12 968 (H) 180 - 914 pg/mL    Comment: (NOTE) This assay is not validated for testing neonatal or myeloproliferative syndrome specimens for Vitamin B12 levels. Performed at [facility], [address], [place], [place] [address]   Folate     Status: None   Collection Time: 08/25/23 12:50 AM  Result Value Ref Range   Folate 6.1 >5.9 ng/mL    Comment: Performed at [facility], [address] [place], [place] [address]  Iron and TIBC     Status: Abnormal   Collection Time: 08/25/23 12:50 AM  Result Value Ref Range   Iron 22 (L) 28 - 170 ug/dL   TIBC 914 782 - 956 ug/dL   Saturation Ratios 8 (L) 10.4 - 31.8 %   UIBC 272 ug/dL    Comment: Performed at [facility], [address], [place], [place] [address]  Ferritin     Status: None   Collection Time: 08/25/23 12:50 AM  Result Value Ref Range   Ferritin 177 11 - 307 ng/mL    Comment: Performed at [facility], [address], [place], [place] [address]  Reticulocytes     Status: Abnormal   Collection Time: 08/25/23 12:50 AM  Result Value Ref Range   Retic Ct Pct 1.2 0.4 - 3.1 %   RBC. 3.00 (L) 3.87 - 5.11 MIL/uL   Retic Count, Absolute 35.1 19.0 - 186.0 K/uL   Immature Retic Fract 11.1 2.3 - 15.9 %    Comment: Performed at [facility], [address], [place], [place] [address]  Type and screen [facility]     Status: None   Collection Time: 08/25/23 12:50 AM  Result Value Ref Range   ABO/RH(D) A POS    Antibody Screen NEG    Sample Expiration      08/28/2023,2359 Performed at [facility], [address], [place], [place] [address]   I-Stat CG4 Lactic Acid     Status: Abnormal   Collection Time: 08/25/23 12:59 AM  Result Value Ref Range   Lactic Acid, Venous 2.2 (HH) 0.5 - 1.9 mmol/L   Comment NOTIFIED PHYSICIAN   I-Stat CG4 Lactic Acid     Status: None   Collection Time: 08/25/23  2:44 AM  Result Value Ref Range   Lactic Acid, Venous 0.6 0.5 - 1.9 mmol/L  CBC     Status: Abnormal   Collection Time: 08/25/23  4:00 AM  Result Value Ref Range   WBC 8.3 4.0 - 10.5 K/uL   RBC 2.66 (L) 3.87 - 5.11 MIL/uL   Hemoglobin 7.3 (L) 12.0 - 15.0 g/dL   HCT 32.4 (L) 40.1 - 02.7 %   MCV 90.6 80.0 - 100.0 fL   MCH 27.4 26.0 - 34.0 pg   MCHC 30.3 30.0 - 36.0 g/dL   RDW 25.3 66.4 - 40.3 %   Platelets 879 (H) 150 - 400 K/uL   nRBC 0.0 0.0 - 0.2 %    Comment: Performed at [facility], [address] [place], [place] [address]  Magnesium     Status: None   Collection Time: 08/25/23  4:00 AM  Result Value Ref Range   Magnesium 1.8 1.7 - 2.4 mg/dL    Comment: Performed at [facility], [address] [place], [place] [address]  Phosphorus     Status: None   Collection Time: 08/25/23  4:00 AM  Result Value Ref Range   Phosphorus 3.0 2.5 - 4.6 mg/dL    Comment: Performed at [facility], [address] [place], [place] [address]  Comprehensive metabolic panel     Status: Abnormal   Collection Time: 08/25/23  4:00 AM  Result Value Ref Range   Sodium 139 135 - 145 mmol/L   Potassium 3.5 3.5 - 5.1 mmol/L   Chloride 111 98 - 111 mmol/L   CO2 21 (L) 22 - 32 mmol/L   Glucose, Bld 96 70 - 99 mg/dL    Comment: Glucose reference range applies only to samples taken after fasting for at least 8 hours.   BUN 8 6 - 20 mg/dL   Creatinine, Ser 2.44 (H) 0.44 - 1.00 mg/dL   Calcium 7.8 (L) 8.9 - 10.3 mg/dL   Total Protein 6.1 (L) 6.5 - 8.1 g/dL   Albumin 2.7 (L) 3.5 - 5.0 g/dL   AST 13 (L) 15 - 41 U/L   ALT 11 0 - 44 U/L   Alkaline Phosphatase 68 38 - 126 U/L   Total Bilirubin 0.4 <1.2 mg/dL   GFR, Estimated >01 >02 mL/min    Comment: (NOTE) Calculated using the CKD-EPI Creatinine Equation (2021)    Anion gap 7 5 - 15    Comment: Performed at [facility], [address] [place], [place] [address]  CK     Status: Abnormal   Collection Time: 08/25/23  4:01 AM  Result Value Ref Range   Total CK 32 (L) 38 - 234 U/L    Comment: Performed at [facility], [address], [place], [place] [address]  Procalcitonin     Status: None   Collection Time: 08/25/23  4:01 AM  Result Value Ref Range   Procalcitonin 0.17 ng/mL    Comment:        Interpretation: PCT (Procalcitonin) <= 0.5 ng/mL: Systemic infection (sepsis) is not likely. Local bacterial infection is possible. (NOTE)       Sepsis PCT Algorithm           Lower Respiratory Tract                                      Infection PCT Algorithm    ----------------------------     ----------------------------         PCT < 0.25 ng/mL                 PCT < 0.10 ng/mL          Strongly encourage             Strongly discourage   discontinuation of antibiotics    initiation of antibiotics    ----------------------------     -----------------------------       PCT 0.25 - 0.50 ng/mL            PCT 0.10 - 0.25 ng/mL               OR       >80% decrease in PCT            Discourage initiation  of                                            antibiotics      Encourage discontinuation           of antibiotics    ----------------------------     -----------------------------         PCT >= 0.50 ng/mL              PCT 0.26 - 0.50 ng/mL               AND        <80% decrease in PCT             Encourage initiation of                                             antibiotics       Encourage continuation           of antibiotics    ----------------------------     -----------------------------        PCT >= 0.50 ng/mL                  PCT > 0.50 ng/mL               AND         increase in PCT                  Strongly encourage                                      initiation of antibiotics    Strongly encourage escalation           of antibiotics                                     -----------------------------                                           PCT <= 0.25 ng/mL                                                 OR                                        > 80% decrease in PCT                                      Discontinue / Do not initiate  antibiotics  Performed at [facility], [address], [place], [place] [address]   ABO/Rh     Status: None   Collection Time: 08/25/23  4:01 AM  Result Value Ref Range   ABO/RH(D)      A POS Performed at [facility], [address], [place], [place] [address]   Blood culture (routine x 2)     Status: None (Preliminary result)   Collection Time: 08/25/23  4:23 AM   Specimen: BLOOD RIGHT ARM  Result Value Ref  Range   Specimen Description      BLOOD RIGHT ARM AEROBIC BOTTLE ONLY ANAEROBIC BOTTLE ONLY Performed at [facility], [address], [place], [place] [address]    Special Requests      BOTTLES DRAWN AEROBIC AND ANAEROBIC Blood Culture adequate volume Performed at [facility], [address] [place], [place] [address]    Culture      NO GROWTH < 12 HOURS Performed at [facility], [address] [place], [place] [address]    Report Status PENDING   CBC     Status: Abnormal   Collection Time: 08/25/23 11:35 AM  Result Value Ref Range   WBC 9.5 4.0 - 10.5 K/uL   RBC 2.65 (L) 3.87 - 5.11 MIL/uL   Hemoglobin 7.5 (L) 12.0 - 15.0 g/dL   HCT 44.0 (L) 34.7 - 42.5 %   MCV 89.1 80.0 - 100.0 fL   MCH 28.3 26.0 - 34.0 pg   MCHC 31.8 30.0 - 36.0 g/dL   RDW 95.6 38.7 - 56.4 %   Platelets 759 (H) 150 - 400 K/uL   nRBC 0.0 0.0 - 0.2 %    Comment: Performed at [facility], [address], [place], [place] [address]    CT HEAD WO CONTRAST ( ) Result Date: 08/25/2023 CLINICAL DATA:  Altered mental status. EXAM: CT HEAD WITHOUT CONTRAST TECHNIQUE: Contiguous axial images were obtained from the base of the skull through the vertex without intravenous contrast. RADIATION DOSE REDUCTION: This exam was performed according to the departmental dose-optimization program which includes automated exposure control, adjustment of the mA and/or kV according to patient size and/or use of iterative reconstruction technique. COMPARISON:  None Available. FINDINGS: Brain: There is no evidence of an acute infarct, intracranial hemorrhage, mass, midline shift, or extra-axial fluid collection. The ventricles and sulci are normal. Vascular: No hyperdense vessel. Skull: No acute fracture or suspicious osseous lesion. Sinuses/Orbits: Visualized paranasal sinuses and mastoid air cells are clear. Unremarkable orbits. Other: None. IMPRESSION: Negative head CT.  Electronically Signed   By: Sebastian Ache M.D.   On: 08/25/2023 12:06   CT ABDOMEN PELVIS WO CONTRAST Result Date: 08/24/2023 CLINICAL DATA:  Abnormal white count EXAM: CT ABDOMEN AND PELVIS WITHOUT CONTRAST TECHNIQUE: Multidetector CT imaging of the abdomen and pelvis was performed following the standard protocol without IV contrast. RADIATION DOSE REDUCTION: This exam was performed according to the departmental dose-optimization program which includes automated exposure control, adjustment of the mA and/or kV according to patient size and/or use of iterative reconstruction technique. COMPARISON:  CT 08/10/2023 FINDINGS: Lower chest: No acute abnormality. Clearing of pleural effusions and lower lobe consolidations. Hepatobiliary: Contracted gallbladder. No calcified stone or biliary dilatation Pancreas: Unremarkable. No pancreatic ductal dilatation or surrounding inflammatory changes. Spleen: Normal in size without focal abnormality. Adrenals/Urinary Tract: Adrenal glands are within normal limits. Small nonobstructing kidney stones. Mild perinephric edema on the right. The bladder  is unremarkable Stomach/Bowel: Stomach is within normal limits. Appendix appears normal. No evidence of bowel wall thickening, distention, or inflammatory changes. Vascular/Lymphatic: No significant vascular findings are present. No enlarged abdominal or pelvic lymph nodes. Reproductive: Uterus and bilateral adnexa are unremarkable. Other: No abdominal wall hernia or abnormality. No abdominopelvic ascites. Musculoskeletal: No acute or significant osseous findings. IMPRESSION: 1. Mild perinephric edema on the right, possible pyelonephritis. No hydronephrosis. Small nonobstructing kidney stones. 2. Clearing of pleural effusions and lower lobe consolidations. Electronically Signed   By: Jasmine Pang M.D.   On: 08/24/2023 21:15   DG Chest 2 View Result Date: 08/24/2023 CLINICAL DATA:  Abnormal white count EXAM: CHEST - 2 VIEW  COMPARISON:  08/14/2023 FINDINGS: The heart size and mediastinal contours are within normal limits. Both lungs are clear. The visualized skeletal structures are unremarkable. IMPRESSION: No active cardiopulmonary disease. Electronically Signed   By: Jasmine Pang M.D.   On: 08/24/2023 21:07    Review of Systems negative except above Blood pressure 126/83, pulse 96, temperature 98.3 F (36.8 C), resp. rate 17, height 5\' 6"  (1.676 m), weight 86 kg, last menstrual period 07/31/2023, SpO2 100%. Physical Exam vital signs stable afebrile no acute distress exam pertinent for being soft nontender BUN and creatinine normal liver tests normal ferritin okay saturation and iron level a little low TIBC low normal more compatible with anemia of chronic disease hemoglobin dropped from 8.6-7.3 with hydration CT scan yesterday negative for any abdominal issues  Assessment/Plan: Anemia questionable etiology Plan: Since she has no GI symptoms and CT ruled out any significant abnormality we will see in the office as an outpatient and repeat CBC symptoms and guaiacs and decide if any GI workup is needed at that time and she agrees with the plan and please call us sooner if we can be of any further assistance with this hospital stay  [facility] E 08/25/2023, 12:41 PM

## 2023-08-25 NOTE — ED Notes (Signed)
Lab called. Critical Platelets 941. Anastassia,Doutova,MD made aware. Per provider will continue to monitor.

## 2023-08-25 NOTE — ED Notes (Signed)
Admitting provided Dr.Doutova informed of pts initial lactic of 2.2 prior to starting ordered 2 L IVF. Pending repeat lactic. No new orders at this time.

## 2023-08-25 NOTE — Progress Notes (Signed)
Pt with non pitting edema on both hands. This RN notice that pt has rings on both hands and are very difficult to remove because of the swelling and making pt uncomfortable. Tried using soap and lubricant to remove rings but was unsuccessful. CN Mardene Celeste borrowed the ring cutters from ED and we were able to removed both of pt rings. NP Virgel Manifold made aware. Will continue to monitor pt.

## 2023-08-25 NOTE — Assessment & Plan Note (Signed)
Chronic stable continue Trintellix 20 mg daily

## 2023-08-25 NOTE — Assessment & Plan Note (Signed)
Continue Zosyn Await results of urine cultures

## 2023-08-25 NOTE — Assessment & Plan Note (Signed)
-  SIRS criteria met with      Component Value Date/Time   WBC 8.7 08/24/2023 1740   LYMPHSABS 1.5 08/14/2023 1043     tachycardia   ,   fever   RR >20 Today's Vitals   08/25/23 0030 08/25/23 0045 08/25/23 0100 08/25/23 0115  BP:      Pulse:      Resp: 19 20 19 18   Temp:      TempSrc:      SpO2:      Weight:      Height:      PainSc:        -Most likely source being:  urinary,    Patient meeting criteria for Severe sepsis with    evidence of end organ damage/organ dysfunction such as      elevated lactic acid >2     Component Value Date/Time   LATICACIDVEN 2.2 (HH) 08/25/2023 0059        - Obtain serial lactic acid and procalcitonin level.  - Initiated IV antibiotics in ER: Antibiotics Given (last 72 hours)     Date/Time Action Medication Dose Rate   08/24/23 2116 New Bag/Given   piperacillin-tazobactam (ZOSYN) IVPB 3.375 g 3.375 g 100 mL/hr       Will continue  on : Zosyn   - await results of blood and urine culture  - Rehydrate aggressively  Intravenous fluids were administered,          30cc/kg fluid    1:44 AM

## 2023-08-25 NOTE — Progress Notes (Signed)
PROGRESS NOTE    WAKELY GEHRKE  QVZ:[id number] DOB: 1996/02/04 DOA: 08/24/2023 PCP: [name], MD   Brief Narrative: 27 year old with past medical history significant for pyelonephritis, orthostatic syncope, depression, anxiety, ADHD, recent hospitalization for pyelonephritis discharged on oral antibiotics presents referred by her PCP due to abnormal labs she was found to have worsening anemia and thrombocytosis.  There is maybe some mention of bloody urine.  She also mention melena a week ago.  CT abdomen and pelvis performed in the ED showed mild perinephric edema on the right, possible pyelonephritis, no hydronephrosis.  Small nonobstructing kidney stone.  Clearing of pleural effusion and lower lobe consolidation.  She has had some persistent shoulder blade pain, thought to be related to pyelonephritis.  Mother at bedside relate that she is being sleepy most of the time in the ED.  Patient reports that the shoulder blade pain is 3 out of 10 Has Been Getting Slowly Better  Assessment & Plan:   Principal Problem:   Pyelonephritis Active Problems:   Major depressive disorder, recurrent severe without psychotic features (HCC)   Tobacco use disorder   Alcohol use disorder, mild, abuse   Attention deficit hyperactivity disorder, predominantly inattentive type   Disorder of vitamin B12   AKI (acute kidney injury) (HCC)   Severe sepsis (HCC)   Thrombocytosis   Anemia   Bacteremia   Prolonged QT interval   1-Pyelonephritis: Severe sepsis: Present with lactic acid more than 2.2, tachycardia, tachypnea. Present with persistent back, shoulder blade pain CT showed pyelonephritis UA: with 11-20 WBC.  Started IV antibiotics.  Follow urine culture, blood culture.   2-Anemia, iron deficiency.  Report Melena, 2 weeks ago.  GI consulted. Plan for out patient follow up./ IV protonix IV BID.   3-Thrombocytosis; could be in setting of iron deficiency and infection. Platelet count trendign  down.  Will need referral to hematology out patient.   4-Acute metabolic encephalopathy Has been sleepy most of time per mother, who was at bedside.  CT head negative  Neuro exam non focal.   Hypomagnesemia; replete IV>   Major depressive disorder, recurrent without psychotic features: Continue with Trintellix  AKI: Present with a creatinine 1.4 improved from previous Continue with IV fluids  Alcohol use disorder: Currently denies  ADHD holding medications due to tachycardia History of B12 deficiency: B12 normal range  History of E. coli bacteremia: Repeat blood cultures : QT: Replete magnesium    Estimated body mass index is 30.6 kg/m as calculated from the following:   Height as of this encounter: 5\' 6"  (1.676 m).   Weight as of this encounter: 86 kg.   DVT prophylaxis: SCDs Code Status: Full code Family Communication: Mother who was at bedside Disposition Plan:  Status is: Observation The patient remains OBS appropriate and will d/c before 2 midnights.    Consultants:  GI  Procedures:  none  Antimicrobials:    Subjective: Sleepy, wakes up to voice, fall back to sleep.  Neuroexam was nonfocal.  Report back pain improved  Objective: Vitals:   08/25/23 0805 08/25/23 1015 08/25/23 1200 08/25/23 1219  BP: 118/74 126/83 126/83   Pulse: (!) 103 98 96   Resp: 18 16 17    Temp: 98.3 F (36.8 C)   98.3 F (36.8 C)  TempSrc: Oral     SpO2: 100% 100% 100%   Weight:      Height:        Intake/Output Summary (Last 24 hours) at 08/25/2023 1451 Last data filed  at 08/25/2023 1038 Gross per 24 hour  Intake 3028.98 ml  Output --  Net 3028.98 ml   Filed Weights   08/24/23 1724  Weight: 86 kg    Examination:  General exam: Appears calm and comfortable  Respiratory system: Clear to auscultation. Respiratory effort normal. Cardiovascular system: S1 & S2 heard, RRR. No JVD, murmurs, rubs, gallops or clicks. No pedal edema. Gastrointestinal system: Abdomen  is nondistended, soft and nontender. No organomegaly or masses felt. Normal bowel sounds heard. Central nervous system: Alert and oriented. No focal neurological deficits. Extremities: Symmetric 5 x 5 power.   Data Reviewed: I have personally reviewed following labs and imaging studies  CBC: Recent Labs  Lab 08/24/23 1740 08/25/23 0050 08/25/23 0400 08/25/23 1135  WBC 8.7 8.8 8.3 9.5  NEUTROABS  --  5.9  --   --   HGB 8.7* 7.9* 7.3* 7.5*  HCT 27.1* 25.2* 24.1* 23.6*  MCV 88.3 89.4 90.6 89.1  PLT 1,040* 941* 879* 759*   Basic Metabolic Panel: Recent Labs  Lab 08/24/23 1740 08/25/23 0400  NA 138 139  K 3.6 3.5  CL 105 111  CO2 22 21*  GLUCOSE 89 96  BUN 12 8  CREATININE 1.45* 1.17*  CALCIUM 9.2 7.8*  MG  --  1.8  PHOS  --  3.0   GFR: Estimated Creatinine Clearance: 78.4 mL/min (A) (by C-G formula based on SCr of 1.17 mg/dL (H)). Liver Function Tests: Recent Labs  Lab 08/24/23 1740 08/25/23 0400  AST 15 13*  ALT 14 11  ALKPHOS 91 68  BILITOT 0.6 0.4  PROT 8.0 6.1*  ALBUMIN 3.5 2.7*   Recent Labs  Lab 08/24/23 1740  LIPASE 106*   No results for input(s): "AMMONIA" in the last 168 hours. Coagulation Profile: Recent Labs  Lab 08/25/23 0050  INR 1.1   Cardiac Enzymes: Recent Labs  Lab 08/25/23 0401  CKTOTAL 32*   BNP (last 3 results) No results for input(s): "PROBNP" in the last 8760 hours. HbA1C: No results for input(s): "HGBA1C" in the last 72 hours. CBG: No results for input(s): "GLUCAP" in the last 168 hours. Lipid Profile: No results for input(s): "CHOL", "HDL", "LDLCALC", "TRIG", "CHOLHDL", "LDLDIRECT" in the last 72 hours. Thyroid Function Tests: Recent Labs    08/25/23 0050  TSH 0.415   Anemia Panel: Recent Labs    08/25/23 0050  VITAMINB12 968*  FOLATE 6.1  FERRITIN 177  TIBC 294  IRON 22*  RETICCTPCT 1.2   Sepsis Labs: Recent Labs  Lab 08/25/23 0059 08/25/23 0244 08/25/23 0401  PROCALCITON  --   --  0.17   LATICACIDVEN 2.2* 0.6  --     Recent Results (from the past 240 hours)  Wet prep, genital     Status: None   Collection Time: 08/24/23  7:46 PM   Specimen: PATH Cytology Cervicovaginal Ancillary Only  Result Value Ref Range Status   Yeast Wet Prep HPF POC NONE SEEN NONE SEEN Final   Trich, Wet Prep NONE SEEN NONE SEEN Final   Clue Cells Wet Prep HPF POC NONE SEEN NONE SEEN Final   WBC, Wet Prep HPF POC <10 <10 Final    Comment: Swab received with less than 0.5 mL of saline, saline added to specimen, interpret results with caution.   Sperm NONE SEEN  Final    Comment: Performed at [facility], [address], [place], [place] [address]  Blood culture (routine x 2)     Status: None (  Preliminary result)   Collection Time: 08/24/23  9:57 PM   Specimen: BLOOD  Result Value Ref Range Status   Specimen Description   Final    BLOOD BLOOD RIGHT HAND Performed at [facility], [address] [place], [place] [address]    Special Requests   Final    BOTTLES DRAWN AEROBIC ONLY Blood Culture results may not be optimal due to an inadequate volume of blood received in culture bottles Performed at [facility], [address] [place], [place] [address]    Culture   Final    NO GROWTH < 12 HOURS Performed at [facility], [address], [place], [place] [address]    Report Status PENDING  Incomplete  Blood culture (routine x 2)     Status: None (Preliminary result)   Collection Time: 08/25/23  4:23 AM   Specimen: BLOOD RIGHT ARM  Result Value Ref Range Status   Specimen Description   Final    BLOOD RIGHT ARM AEROBIC BOTTLE ONLY ANAEROBIC BOTTLE ONLY Performed at [facility], [address], [place], [place] [address]    Special Requests   Final    BOTTLES DRAWN AEROBIC AND ANAEROBIC Blood Culture adequate volume Performed at [facility], [address], [place], [place] [address]     Culture   Final    NO GROWTH < 12 HOURS Performed at [facility], [address], [place], [place] [address]    Report Status PENDING  Incomplete         Radiology Studies: CT HEAD WO CONTRAST ( ) Result Date: 08/25/2023 CLINICAL DATA:  Altered mental status. EXAM: CT HEAD WITHOUT CONTRAST TECHNIQUE: Contiguous axial images were obtained from the base of the skull through the vertex without intravenous contrast. RADIATION DOSE REDUCTION: This exam was performed according to the departmental dose-optimization program which includes automated exposure control, adjustment of the mA and/or kV according to patient size and/or use of iterative reconstruction technique. COMPARISON:  None Available. FINDINGS: Brain: There is no evidence of an acute infarct, intracranial hemorrhage, mass, midline shift, or extra-axial fluid collection. The ventricles and sulci are normal. Vascular: No hyperdense vessel. Skull: No acute fracture or suspicious osseous lesion. Sinuses/Orbits: Visualized paranasal sinuses and mastoid air cells are clear. Unremarkable orbits. Other: None. IMPRESSION: Negative head CT. Electronically Signed   By: Sebastian Ache M.D.   On: 08/25/2023 12:06   CT ABDOMEN PELVIS WO CONTRAST Result Date: 08/24/2023 CLINICAL DATA:  Abnormal white count EXAM: CT ABDOMEN AND PELVIS WITHOUT CONTRAST TECHNIQUE: Multidetector CT imaging of the abdomen and pelvis was performed following the standard protocol without IV contrast. RADIATION DOSE REDUCTION: This exam was performed according to the departmental dose-optimization program which includes automated exposure control, adjustment of the mA and/or kV according to patient size and/or use of iterative reconstruction technique. COMPARISON:  CT 08/10/2023 FINDINGS: Lower chest: No acute abnormality. Clearing of pleural effusions and lower lobe consolidations. Hepatobiliary: Contracted gallbladder. No calcified stone or biliary dilatation Pancreas:  Unremarkable. No pancreatic ductal dilatation or surrounding inflammatory changes. Spleen: Normal in size without focal abnormality. Adrenals/Urinary Tract: Adrenal glands are within normal limits. Small nonobstructing kidney stones. Mild perinephric edema on the right. The bladder is unremarkable Stomach/Bowel: Stomach is within normal limits. Appendix appears normal. No evidence of bowel wall thickening, distention, or inflammatory changes. Vascular/Lymphatic: No significant vascular findings are present. No enlarged abdominal or pelvic lymph nodes. Reproductive: Uterus and bilateral adnexa are  unremarkable. Other: No abdominal wall hernia or abnormality. No abdominopelvic ascites. Musculoskeletal: No acute or significant osseous findings. IMPRESSION: 1. Mild perinephric edema on the right, possible pyelonephritis. No hydronephrosis. Small nonobstructing kidney stones. 2. Clearing of pleural effusions and lower lobe consolidations. Electronically Signed   By: Jasmine Pang M.D.   On: 08/24/2023 21:15   DG Chest 2 View Result Date: 08/24/2023 CLINICAL DATA:  Abnormal white count EXAM: CHEST - 2 VIEW COMPARISON:  08/14/2023 FINDINGS: The heart size and mediastinal contours are within normal limits. Both lungs are clear. The visualized skeletal structures are unremarkable. IMPRESSION: No active cardiopulmonary disease. Electronically Signed   By: Jasmine Pang M.D.   On: 08/24/2023 21:07        Scheduled Meds:  atomoxetine  40 mg Oral Daily   And   atomoxetine  60 mg Oral Daily   pantoprazole (PROTONIX) IV  40 mg Intravenous Q12H   vortioxetine HBr  20 mg Oral Daily   Continuous Infusions:  piperacillin-tazobactam (ZOSYN)  IV 3.375 g (08/25/23 1135)     LOS: 1 day    Time spent: 35 minutes    Azucena Dart A Lucca Greggs, MD [facility]   If 7PM-7AM, please contact night-coverage [email or web address]  08/25/2023, 2:51 PM

## 2023-08-26 ENCOUNTER — Observation Stay (HOSPITAL_COMMUNITY): Payer: BC Managed Care – PPO

## 2023-08-26 DIAGNOSIS — D75839 Thrombocytosis, unspecified: Secondary | ICD-10-CM | POA: Diagnosis not present

## 2023-08-26 DIAGNOSIS — N12 Tubulo-interstitial nephritis, not specified as acute or chronic: Secondary | ICD-10-CM | POA: Diagnosis not present

## 2023-08-26 DIAGNOSIS — R600 Localized edema: Secondary | ICD-10-CM | POA: Diagnosis not present

## 2023-08-26 LAB — BASIC METABOLIC PANEL
Anion gap: 8 (ref 5–15)
BUN: 7 mg/dL (ref 6–20)
CO2: 21 mmol/L — ABNORMAL LOW (ref 22–32)
Calcium: 8.1 mg/dL — ABNORMAL LOW (ref 8.9–10.3)
Chloride: 105 mmol/L (ref 98–111)
Creatinine, Ser: 1.34 mg/dL — ABNORMAL HIGH (ref 0.44–1.00)
GFR, Estimated: 55 mL/min — ABNORMAL LOW (ref >60)
Glucose, Bld: 95 mg/dL (ref 70–99)
Potassium: 3.6 mmol/L (ref 3.5–5.1)
Sodium: 134 mmol/L — ABNORMAL LOW (ref 135–145)

## 2023-08-26 LAB — CBC
HCT: 26.6 % — ABNORMAL LOW (ref 36.0–46.0)
Hemoglobin: 8.1 g/dL — ABNORMAL LOW (ref 12.0–15.0)
MCH: 28 pg (ref 26.0–34.0)
MCHC: 30.5 g/dL (ref 30.0–36.0)
MCV: 92 fL (ref 80.0–100.0)
Platelets: 784 10*3/uL — ABNORMAL HIGH (ref 150–400)
RBC: 2.89 MIL/uL — ABNORMAL LOW (ref 3.87–5.11)
RDW: 14.1 % (ref 11.5–15.5)
WBC: 9.5 10*3/uL (ref 4.0–10.5)
nRBC: 0 % (ref 0.0–0.2)

## 2023-08-26 LAB — VITAMIN D 25 HYDROXY (VIT D DEFICIENCY, FRACTURES): Vit D, 25-Hydroxy: 51.38 ng/mL (ref 30–100)

## 2023-08-26 MED ORDER — SODIUM CHLORIDE 0.9 % IV SOLN: 2.0000 g | INTRAVENOUS | Status: DC

## 2023-08-26 MED ORDER — ENSURE ENLIVE PO LIQD
237.0000 mL | Freq: Two times a day (BID) | ORAL | Status: DC
  Administered 2023-08-26 – 2023-08-27 (×2): 237 mL via ORAL

## 2023-08-26 MED ORDER — CEFAZOLIN SODIUM-DEXTROSE 2-4 GM/100ML-% IV SOLN
2.0000 g | Freq: Three times a day (TID) | INTRAVENOUS | Status: DC
  Administered 2023-08-26: 2 g via INTRAVENOUS
  Filled 2023-08-26 (×2): qty 100

## 2023-08-26 MED ORDER — GADOBUTROL 1 MMOL/ML IV SOLN
8.5000 mL | Freq: Once | INTRAVENOUS | Status: AC | PRN
  Administered 2023-08-26: 8.5 mL via INTRAVENOUS

## 2023-08-26 NOTE — Plan of Care (Signed)
  Problem: Clinical Measurements: Goal: Signs and symptoms of infection will decrease Outcome: Progressing   Problem: Education: Goal: Knowledge of General Education information will improve Description: Including pain rating scale, medication(s)/side effects and non-pharmacologic comfort measures Outcome: Progressing   Problem: Coping: Goal: Level of anxiety will decrease Outcome: Progressing

## 2023-08-26 NOTE — Consult Note (Addendum)
Date of Admission:  08/24/2023          Reason for Consult: "Recurrent urinary tract infection"     Referring Provider: Hartley Barefoot,  MD   Assessment:  E coli bacteremia in early December likely from biliary or intra-abdominal source She DID NOT have a UTI in early December NOR does she have one now Mid and upper back pain that was quite severe but is now resolved Thrombocytosis thought to possibly be a rebound phenomenon from her thrombocytopenia when she was septic ADHD Depression Alcohol disuse disorder per chart and hepatic steatosis  Plan:  DC antibiotics I am ordering an MRI of the thoracic spine to ensure there is no pathology there I am ordering a 2D echocardiogram to do an initial look for endocarditis I am skeptical that she has endocarditis I am ordering her urine drug screen which I discussed with the patient.  I am skeptical that she is a person injects drugs and believe her story. If she remains stable off antibiotics and we do not find any active infection that needs to be treated or other reason that she needs to remain in the hospital she should be able to be discharged to home with follow-up with PCP and repeat labs  Principal Problem:   Pyelonephritis Active Problems:   Tobacco use disorder   Alcohol use disorder, mild, abuse   Attention deficit hyperactivity disorder, predominantly inattentive type   Disorder of vitamin B12   Major depressive disorder, recurrent severe without psychotic features (HCC)   AKI (acute kidney injury) (HCC)   Severe sepsis (HCC)   Thrombocytosis   Anemia   Bacteremia   Prolonged QT interval   Scheduled Meds:  atomoxetine  40 mg Oral Daily   And   atomoxetine  60 mg Oral Daily   feeding supplement  237 mL Oral BID BM   ferrous sulfate  325 mg Oral Q breakfast   pantoprazole (PROTONIX) IV  40 mg Intravenous Q12H   vortioxetine HBr  20 mg Oral Daily   Continuous Infusions:  sodium chloride Stopped  (08/26/23 1214)   thiamine (VITAMIN B1) injection Stopped (08/26/23 1057)   PRN Meds:.acetaminophen **OR** acetaminophen, HYDROcodone-acetaminophen, mouth rinse  HPI: Renee Mcdowell is a 27 y.o. female with past medical history significant for depression anxiety ADHD who had developed pain near her trapezius muscles in her right shoulder that have been going on for about a month.  She had initially sought evaluation for this at the end of November and was diagnosed with a muscle sprain and treated with Toradol and Decadron discharged with prednisone.  She had multiple trips to ERs in the interim.  She also developed symptoms of nausea vomiting and diarrhea.  She was ultimately admitted to the hospitalist service on August 08, 2023 with severe pain in her upper back as well as abdominal pain nausea vomiting and chills.  Upon admission her blood cell was 19,000 she was in acute renal failure with creatinine 2.25 elevated ALT and bilirubin at 1.7.  It appears based on the notes from her prior admission that a biliary source was potentially sought as she underwent right upper quadrant scan which showed a partially contracted gallbladder and some gallbladder thickening and mild pericholecystic fluid but with no evidence of cholelithiasis or acute cholecystitis.  She then had CT renal study which showed some asymmetric enlarged to the right kidney with mild perinephric inflammatory fat stranding and a 2 mm and 3  mm nonobstructing right renal calculus with some inflammatory fat stranding along the posterior aspect of the right lower quadrant pelvis and the right with small amount of posterior pelvic fluid.  She had a chest x-ray at the time as well which was without active disease.  She then underwent HIDA scan which did not show scintigraphic evidence of cystic duct obstruction or acute cholecystitis.  She then had a CT abdomen pelvis performed on December the fourth.  This showed some small pleural  effusions and some atelectasis with hepatic steatosis and a small amount of fluid in the pelvis.  Of note on the day of admission she had had a urine analysis despite 0 urinary symptoms UA had shown greater than 50 white blood cells protein and some rare bacteria she had blood cultures taken as well as urine cultures taken prior to being initiated on antibiotics.  Urine culture came back with less than 2000 colony-forming units of E. coli blood cultures however came back positive in 2 out of 2 sites with E. coli that was a fairly sensitive organism.  She had already been placed on ceftriaxone and metronidazole after cultures were taken.  When sensitivities came back she was continued on ceftriaxone eventually transition to Augmentin.  She was labeled as having a urinary tract infection with bacteremia but again she made no criteria for urinary tract infection  First of all she had ZERO, ZERO ZERO symptoms of a UTI the absence of which excludes a urinary tract infection.  Second of all she only had 2000 colony-forming units of E. coli in her urine.  If the urine was the source of her bacteremia there is no way she would only have had 2000 colony-forming units there and I suspect that E. coli that was there was there due to translocation from the blood.  In any case she was discharged with Augmentin and then had been doing relatively well.  She did not experience any recurrence of her symptoms and her back pain had actually resolved altogether curiously her mid thoracic to upper back pain was ascribed to being "referred pain from her bladder which to me seems to be more than a "stretch."  She then saw her primary care physician at [facility] who had drawn blood work on her and noted her platelets to be in the 1000 range.  Of note when she was admitted with her septic picture with E. coli bacteremia she had thrombocytopenia in the context of sepsis with a Nadear of 80,000 platelets.  Because of her  elevated platelets and anemia she was directed to come to the emergency department was admitted to [facility].  Again she had 0 symptoms concerning for urinary tract infection she did not have specifically any dysuria any suprapubic pain any flank pain.  She had no fevers she had no other systemic symptoms such as nausea or vomiting and her back pain at altogether resolved her urine was analyzed and had 11-20 white blood cells and her culture was taken which grew E. coli her blood cultures were taken again which have fortunately been negative.  I was consulted for "recurrent urinary tract infection but in reviewing the chart and discussing the case with the patient and Dr. Sunnie Nielsen it was clear to me that the patient never had a urinary tract infection in early December and does not have one now.  Which she did have in early December was E. coli bacteremia and sepsis.  The source of that sepsis is not  altogether clear based on the IMAGING that was done.  In talking to her she denies any history of intravenous drug use and she does not seem to have the phenotype of a person who injects drugs.  She tells me that her back pain is resolved though I have propose getting an MRI to ensure that this was not a source of infection.  I also ordered an echocardiogram to be surprised if she would have endocarditis.  In further thinking about her case I suspect that she did have an intra-abdominal potentially biliary source of infection and that her biliary pathology was simply not diagnosed by that time she was admitted as she had had ongoing symptoms in terms of her back pain for weeks and also nausea and vomiting for at least a week prior to admission.  She does carry a diagnosis of alcohol use disorder with mild abuse and also hepatic steatosis but no cirrhosis.  If she were cirrhotic certainly translocation of gut bacteria could happen quite easily.  This does not appear to be the case though  For  now I have ordered an MRI of the thoracic spine with contrast and a 2D echocardiogram as well as a urine drug screen which will be positive for amphetamines due to her Adderall for ADHD.  I have stopped her antibiotics.  I discussed the case with Dr. Sunnie Nielsen and apparently discussions with Dr. Bertis Ruddy with Heme/Onc re the patient's thrombocytosis.  The leading causes thought to be a rebound phenomenon from her thrombocytopenia in the context of her sepsis indeed her thrombocytosis is improving during her course of her stay here at [facility].  I have personally spent 112 minutes involved in face-to-face and non-face-to-face activities for this patient on the day of the visit. Professional time spent includes the following activities: Preparing to see the patient (review of tests), Obtaining and/or reviewing separately obtained history (admission/discharge record), Performing a medically appropriate examination and/or evaluation , Ordering medications/tests/procedures, referring and communicating with other health care professionals, Documenting clinical information in the EMR, Independently interpreting results (not separately reported), Communicating results to the patient/family/caregiver, Counseling and educating the patient/family/caregiver and Care coordination (not separately reported).     Review of Systems: Review of Systems  Constitutional:  Negative for chills, diaphoresis, fever, malaise/fatigue and weight loss.  HENT:  Negative for congestion, hearing loss, sore throat and tinnitus.   Eyes:  Negative for blurred vision and double vision.  Respiratory:  Negative for cough, sputum production, shortness of breath and wheezing.   Cardiovascular:  Negative for chest pain, palpitations and leg swelling.  Gastrointestinal:  Negative for abdominal pain, blood in stool, constipation, diarrhea, heartburn, melena, nausea and vomiting.  Genitourinary:  Negative for dysuria, flank pain  and hematuria.  Musculoskeletal:  Negative for back pain, falls, joint pain and myalgias.  Skin:  Negative for itching and rash.  Neurological:  Negative for dizziness, sensory change, focal weakness, loss of consciousness, weakness and headaches.  Endo/Heme/Allergies:  Does not bruise/bleed easily.  Psychiatric/Behavioral:  Negative for depression, memory loss and suicidal ideas. The patient is not nervous/anxious.     Past Medical History:  Diagnosis Date   ADHD (attention deficit hyperactivity disorder)    Anxiety    Depression    Major depressive disorder, recurrent episode, moderate (HCC)    Nicotine abuse    Obesity (BMI 30.0-34.9)    Psychiatric inpatient    Social phobia    Suicidal ideations     Social History  Tobacco Use   Smoking status: Former    Current packs/day: 0.10    Types: Cigarettes   Smokeless tobacco: Never  Vaping Use   Vaping status: Every Day  Substance Use Topics   Alcohol use: Yes    Alcohol/week: 2.0 standard drinks of alcohol    Types: 2 Glasses of wine per week   Drug use: No    Family History  Problem Relation Age of Onset   Mental illness Father        Schizoaffective disorder   Thyroid disease Mother    Valvular heart disease Mother    Allergies  Allergen Reactions   Azithromycin Hives    OBJECTIVE: Blood pressure 126/75, pulse 92, temperature 97.8 F (36.6 C), temperature source Oral, resp. rate 20, height 5\' 6"  (1.676 m), weight 86 kg, last menstrual period 07/31/2023, SpO2 100%.  Physical Exam Constitutional:      General: She is not in acute distress.    Appearance: Normal appearance. She is well-developed. She is not ill-appearing or diaphoretic.  HENT:     Head: Normocephalic and atraumatic.     Right Ear: Hearing and external ear normal.     Left Ear: Hearing and external ear normal.     Nose: No nasal deformity or rhinorrhea.  Eyes:     General: No scleral icterus.    Conjunctiva/sclera: Conjunctivae normal.      Right eye: Right conjunctiva is not injected.     Left eye: Left conjunctiva is not injected.     Pupils: Pupils are equal, round, and reactive to light.  Neck:     Vascular: No JVD.  Cardiovascular:     Rate and Rhythm: Normal rate and regular rhythm.     Heart sounds: Normal heart sounds, S1 normal and S2 normal. No murmur heard.    No friction rub.  Abdominal:     General: Bowel sounds are normal. There is no distension.     Palpations: Abdomen is soft.     Tenderness: There is no abdominal tenderness.  Musculoskeletal:        General: Normal range of motion.     Right shoulder: Normal.     Left shoulder: Normal.     Cervical back: Normal range of motion and neck supple.     Right hip: Normal.     Left hip: Normal.     Right knee: Normal.     Left knee: Normal.  Lymphadenopathy:     Head:     Right side of head: No submandibular, preauricular or posterior auricular adenopathy.     Left side of head: No submandibular, preauricular or posterior auricular adenopathy.     Cervical: No cervical adenopathy.     Right cervical: No superficial or deep cervical adenopathy.    Left cervical: No superficial or deep cervical adenopathy.  Skin:    General: Skin is warm and dry.     Coloration: Skin is not pale.     Findings: No abrasion, bruising, ecchymosis, erythema, lesion or rash.     Nails: There is no clubbing.  Neurological:     Mental Status: She is alert and oriented to person, place, and time.     Sensory: No sensory deficit.     Coordination: Coordination normal.     Gait: Gait normal.  Psychiatric:        Attention and Perception: She is attentive.        Mood and Affect: Mood normal.  Speech: Speech normal.        Behavior: Behavior normal. Behavior is cooperative.        Thought Content: Thought content normal.        Judgment: Judgment normal.     Lab Results Lab Results  Component Value Date   WBC 9.5 08/26/2023   HGB 8.1 (L) 08/26/2023   HCT 26.6  (L) 08/26/2023   MCV 92.0 08/26/2023   PLT 784 (H) 08/26/2023    Lab Results  Component Value Date   CREATININE 1.34 (H) 08/26/2023   BUN 7 08/26/2023   NA 134 (L) 08/26/2023   K 3.6 08/26/2023   CL 105 08/26/2023   CO2 21 (L) 08/26/2023    Lab Results  Component Value Date   ALT 11 08/25/2023   AST 13 (L) 08/25/2023   ALKPHOS 68 08/25/2023   BILITOT 0.4 08/25/2023     Microbiology: Recent Results (from the past 240 hours)  Urine Culture (for pregnant, neutropenic or urologic patients or patients with an indwelling urinary catheter)     Status: Abnormal (Preliminary result)   Collection Time: 08/24/23  7:23 PM   Specimen: Urine, Clean Catch  Result Value Ref Range Status   Specimen Description   Final    URINE, CLEAN CATCH Performed at [facility], [address] [place], [place] [address]    Special Requests   Final    NONE Performed at [facility], [address] [place], [place] [address]    Culture >=100,000 COLONIES/mL ESCHERICHIA COLI (A)  Final   Report Status PENDING  Incomplete  Wet prep, genital     Status: None   Collection Time: 08/24/23  7:46 PM   Specimen: PATH Cytology Cervicovaginal Ancillary Only  Result Value Ref Range Status   Yeast Wet Prep HPF POC NONE SEEN NONE SEEN Final   Trich, Wet Prep NONE SEEN NONE SEEN Final   Clue Cells Wet Prep HPF POC NONE SEEN NONE SEEN Final   WBC, Wet Prep HPF POC <10 <10 Final    Comment: Swab received with less than 0.5 mL of saline, saline added to specimen, interpret results with caution.   Sperm NONE SEEN  Final    Comment: Performed at [facility], [address], [place], [place] [address]  Blood culture (routine x 2)     Status: None (Preliminary result)   Collection Time: 08/24/23  9:57 PM   Specimen: BLOOD  Result Value Ref Range Status   Specimen Description   Final    BLOOD BLOOD RIGHT HAND Performed at [facility], [address], [place], [place] [address]    Special Requests   Final    BOTTLES DRAWN AEROBIC ONLY Blood Culture results may not be optimal due to an inadequate volume of blood received in culture bottles Performed at [facility], [address], [place], [place] [address]    Culture   Final    NO GROWTH 2 DAYS Performed at [facility], [address], [place], [place] [address]    Report Status PENDING  Incomplete  Blood culture (routine x 2)     Status: None (Preliminary result)   Collection Time: 08/25/23  4:23 AM   Specimen: BLOOD RIGHT ARM  Result Value Ref Range Status   Specimen Description   Final    BLOOD RIGHT ARM AEROBIC BOTTLE ONLY ANAEROBIC BOTTLE ONLY Performed at [facility], [address], [place], [place] [address]    Special Requests   Final    BOTTLES DRAWN AEROBIC AND ANAEROBIC Blood Culture adequate volume Performed at [facility], [address], [place], [place] [address]    Culture   Final    NO GROWTH 1 DAY Performed at [facility], [address] [place], [place] [address]    Report Status PENDING  Incomplete    Acey Lav, MD [facility] [phone] pager  08/26/2023, 4:52 PM

## 2023-08-26 NOTE — Progress Notes (Signed)
PROGRESS NOTE    Renee Mcdowell  NGE:[id number] DOB: Nov 30, 1995 DOA: 08/24/2023 PCP: [name], MD   Brief Narrative: 27 year old with past medical history significant for pyelonephritis, orthostatic syncope, depression, anxiety, ADHD, recent hospitalization for pyelonephritis discharged on oral antibiotics presents referred by her PCP due to abnormal labs she was found to have worsening anemia and thrombocytosis.  There is maybe some mention of bloody urine.  She also mention melena a week ago.  CT abdomen and pelvis performed in the ED showed mild perinephric edema on the right, possible pyelonephritis, no hydronephrosis.  Small nonobstructing kidney stone.  Clearing of pleural effusion and lower lobe consolidation.  She has had some persistent shoulder blade pain, thought to be related to pyelonephritis.  Mother at bedside relate that she is being sleepy most of the time in the ED.  Patient reports that the shoulder blade pain is 3 out of 10 Has Been Getting Slowly Better  Assessment & Plan:   Principal Problem:   Pyelonephritis Active Problems:   Major depressive disorder, recurrent severe without psychotic features (HCC)   Tobacco use disorder   Alcohol use disorder, mild, abuse   Attention deficit hyperactivity disorder, predominantly inattentive type   Disorder of vitamin B12   AKI (acute kidney injury) (HCC)   Severe sepsis (HCC)   Thrombocytosis   Anemia   Bacteremia   Prolonged QT interval   1-Pyelonephritis: Severe sepsis: Present with lactic acid more than 2.2, tachycardia, tachypnea. Present with persistent back, shoulder blade pain CT showed pyelonephritis UA: with 11-20 WBC.  Started IV antibiotics.  Urine culture: growing E coli.  blood culture. No growth to date.  Reviewed case with urology, they see any indication for procedure. Recommend ID consultation.  Dr Daiva Eves consulted. Recommend to stop IV antibiotics. Proceed with MRI thoracic spine due to shoulder  blade pain which is not explain by pyelo. Also recommend ECHO due to recent bacteremia. Follow ID recommendations.    2-Anemia, iron deficiency.  Report Melena, 2 weeks ago.  GI consulted. Plan for out patient follow up./ IV protonix IV BID.  Started iron supplement.   3-Thrombocytosis;  -could be in setting of iron deficiency and infection. Platelet count trendign down.  Discussed case with Dr Bertis Ruddy with hematology, Thrombocytosis could be related to iron deficiency anemia, recent GI bleed and also  reactive thrombocytosis from recent thrombocytopenia. She will help arrange out patient follow up./  Will need referral to hematology out patient.   4-Acute metabolic encephalopathy Has been sleepy most of time per mother, who was at bedside.  CT head negative  Neuro exam non focal.  She has been more alert. She is appropriately  answering questions.  Started on IV Thiamine 3 days.   Hypomagnesemia;Replaced.   Major depressive disorder, recurrent without psychotic features: Continue with Trintellix  AKI: Present with a creatinine 1.4 improved from previous Continue with IV fluids  Alcohol use disorder: Currently denies  ADHD holding medications due to tachycardia History of B12 deficiency: B12 normal range  History of E. coli bacteremia: Repeat blood cultures; no growth to date.   QT: Replete magnesium  Weight loss 15 pounds since she has been sick.  Advised to follow up with Dr Ewing Schlein for endoscopy/colonoscopy in setting of iron deficiency anemia.     Estimated body mass index is 30.6 kg/m as calculated from the following:   Height as of this encounter: 5\' 6"  (1.676 m).   Weight as of this encounter: 86 kg.  DVT prophylaxis: SCDs Code Status: Full code Family Communication: Mother who was at bedside Disposition Plan:  Status is: Observation The patient remains OBS appropriate and will d/c before 2 midnights.    Consultants:  GI  Procedures:   none  Antimicrobials:    Subjective: She is alert, report no back pain. She is feeling better.    Objective: Vitals:   08/25/23 1609 08/25/23 2012 08/26/23 0011 08/26/23 0420  BP: 117/73 129/79 128/76 124/71  Pulse: 92 91 90 88  Resp: 20 18 14 15   Temp: 97.8 F (36.6 C) 98.1 F (36.7 C) (!) 97.5 F (36.4 C) 98.5 F (36.9 C)  TempSrc: Oral Oral Oral Oral  SpO2: 99% 100% 98% 100%  Weight:      Height:        Intake/Output Summary (Last 24 hours) at 08/26/2023 1131 Last data filed at 08/26/2023 1117 Gross per 24 hour  Intake 1051.73 ml  Output 1600 ml  Net -548.27 ml   Filed Weights   08/24/23 1724  Weight: 86 kg    Examination:  General exam: NAD Respiratory system:CTA Cardiovascular system: S 1, S 2 RRR Gastrointestinal system: BS present, soft, nt Extremities: no edema.   Data Reviewed: I have personally reviewed following labs and imaging studies  CBC: Recent Labs  Lab 08/24/23 1740 08/25/23 0050 08/25/23 0400 08/25/23 1135 08/26/23 0508  WBC 8.7 8.8 8.3 9.5 9.5  NEUTROABS  --  5.9  --   --   --   HGB 8.7* 7.9* 7.3* 7.5* 8.1*  HCT 27.1* 25.2* 24.1* 23.6* 26.6*  MCV 88.3 89.4 90.6 89.1 92.0  PLT 1,040* 941* 879* 759* 784*   Basic Metabolic Panel: Recent Labs  Lab 08/24/23 1740 08/25/23 0400 08/26/23 0508  NA 138 139 134*  K 3.6 3.5 3.6  CL 105 111 105  CO2 22 21* 21*  GLUCOSE 89 96 95  BUN 12 8 7   CREATININE 1.45* 1.17* 1.34*  CALCIUM 9.2 7.8* 8.1*  MG  --  1.8  --   PHOS  --  3.0  --    GFR: Estimated Creatinine Clearance: 68.5 mL/min (A) (by C-G formula based on SCr of 1.34 mg/dL (H)). Liver Function Tests: Recent Labs  Lab 08/24/23 1740 08/25/23 0400  AST 15 13*  ALT 14 11  ALKPHOS 91 68  BILITOT 0.6 0.4  PROT 8.0 6.1*  ALBUMIN 3.5 2.7*   Recent Labs  Lab 08/24/23 1740  LIPASE 106*   No results for input(s): "AMMONIA" in the last 168 hours. Coagulation Profile: Recent Labs  Lab 08/25/23 0050  INR 1.1    Cardiac Enzymes: Recent Labs  Lab 08/25/23 0401  CKTOTAL 32*   BNP (last 3 results) No results for input(s): "PROBNP" in the last 8760 hours. HbA1C: No results for input(s): "HGBA1C" in the last 72 hours. CBG: No results for input(s): "GLUCAP" in the last 168 hours. Lipid Profile: No results for input(s): "CHOL", "HDL", "LDLCALC", "TRIG", "CHOLHDL", "LDLDIRECT" in the last 72 hours. Thyroid Function Tests: Recent Labs    08/25/23 0050  TSH 0.415   Anemia Panel: Recent Labs    08/25/23 0050  VITAMINB12 968*  FOLATE 6.1  FERRITIN 177  TIBC 294  IRON 22*  RETICCTPCT 1.2   Sepsis Labs: Recent Labs  Lab 08/25/23 0059 08/25/23 0244 08/25/23 0401  PROCALCITON  --   --  0.17  LATICACIDVEN 2.2* 0.6  --     Recent Results (from the past 240 hours)  Urine Culture (  for pregnant, neutropenic or urologic patients or patients with an indwelling urinary catheter)     Status: Abnormal (Preliminary result)   Collection Time: 08/24/23  7:23 PM   Specimen: Urine, Clean Catch  Result Value Ref Range Status   Specimen Description   Final    URINE, CLEAN CATCH Performed at [facility], [address], [place], [place] [address]    Special Requests   Final    NONE Performed at [facility], [address] [place], [place] [address]    Culture >=100,000 COLONIES/mL ESCHERICHIA COLI (A)  Final   Report Status PENDING  Incomplete  Wet prep, genital     Status: None   Collection Time: 08/24/23  7:46 PM   Specimen: PATH Cytology Cervicovaginal Ancillary Only  Result Value Ref Range Status   Yeast Wet Prep HPF POC NONE SEEN NONE SEEN Final   Trich, Wet Prep NONE SEEN NONE SEEN Final   Clue Cells Wet Prep HPF POC NONE SEEN NONE SEEN Final   WBC, Wet Prep HPF POC <10 <10 Final    Comment: Swab received with less than 0.5 mL of saline, saline added to specimen, interpret results with caution.   Sperm NONE SEEN  Final    Comment: Performed  at [facility], [address], [place], [place] [address]  Blood culture (routine x 2)     Status: None (Preliminary result)   Collection Time: 08/24/23  9:57 PM   Specimen: BLOOD  Result Value Ref Range Status   Specimen Description   Final    BLOOD BLOOD RIGHT HAND Performed at [facility], [address], [place], [place] [address]    Special Requests   Final    BOTTLES DRAWN AEROBIC ONLY Blood Culture results may not be optimal due to an inadequate volume of blood received in culture bottles Performed at [facility], [address] [place], [place] [address]    Culture   Final    NO GROWTH 2 DAYS Performed at [facility], [address], [place], [place] [address]    Report Status PENDING  Incomplete  Blood culture (routine x 2)     Status: None (Preliminary result)   Collection Time: 08/25/23  4:23 AM   Specimen: BLOOD RIGHT ARM  Result Value Ref Range Status   Specimen Description   Final    BLOOD RIGHT ARM AEROBIC BOTTLE ONLY ANAEROBIC BOTTLE ONLY Performed at [facility], [address] [place], [place] [address]    Special Requests   Final    BOTTLES DRAWN AEROBIC AND ANAEROBIC Blood Culture adequate volume Performed at [facility], [address], [place], [place] [address]    Culture   Final    NO GROWTH 1 DAY Performed at [facility], [address], [place], [place] [address]    Report Status PENDING  Incomplete         Radiology Studies: CT HEAD WO CONTRAST ( ) Result Date: 08/25/2023 CLINICAL DATA:  Altered mental status. EXAM: CT HEAD WITHOUT CONTRAST TECHNIQUE: Contiguous axial images were obtained from the base of the skull through the vertex without intravenous contrast. RADIATION DOSE REDUCTION: This exam was performed according to the departmental dose-optimization program which includes automated exposure control, adjustment of the mA  and/or kV according to patient size and/or use of iterative reconstruction technique. COMPARISON:  None Available. FINDINGS: Brain: There is no evidence of  an acute infarct, intracranial hemorrhage, mass, midline shift, or extra-axial fluid collection. The ventricles and sulci are normal. Vascular: No hyperdense vessel. Skull: No acute fracture or suspicious osseous lesion. Sinuses/Orbits: Visualized paranasal sinuses and mastoid air cells are clear. Unremarkable orbits. Other: None. IMPRESSION: Negative head CT. Electronically Signed   By: Sebastian Ache M.D.   On: 08/25/2023 12:06   CT ABDOMEN PELVIS WO CONTRAST Result Date: 08/24/2023 CLINICAL DATA:  Abnormal white count EXAM: CT ABDOMEN AND PELVIS WITHOUT CONTRAST TECHNIQUE: Multidetector CT imaging of the abdomen and pelvis was performed following the standard protocol without IV contrast. RADIATION DOSE REDUCTION: This exam was performed according to the departmental dose-optimization program which includes automated exposure control, adjustment of the mA and/or kV according to patient size and/or use of iterative reconstruction technique. COMPARISON:  CT 08/10/2023 FINDINGS: Lower chest: No acute abnormality. Clearing of pleural effusions and lower lobe consolidations. Hepatobiliary: Contracted gallbladder. No calcified stone or biliary dilatation Pancreas: Unremarkable. No pancreatic ductal dilatation or surrounding inflammatory changes. Spleen: Normal in size without focal abnormality. Adrenals/Urinary Tract: Adrenal glands are within normal limits. Small nonobstructing kidney stones. Mild perinephric edema on the right. The bladder is unremarkable Stomach/Bowel: Stomach is within normal limits. Appendix appears normal. No evidence of bowel wall thickening, distention, or inflammatory changes. Vascular/Lymphatic: No significant vascular findings are present. No enlarged abdominal or pelvic lymph nodes. Reproductive: Uterus and bilateral adnexa are  unremarkable. Other: No abdominal wall hernia or abnormality. No abdominopelvic ascites. Musculoskeletal: No acute or significant osseous findings. IMPRESSION: 1. Mild perinephric edema on the right, possible pyelonephritis. No hydronephrosis. Small nonobstructing kidney stones. 2. Clearing of pleural effusions and lower lobe consolidations. Electronically Signed   By: Jasmine Pang M.D.   On: 08/24/2023 21:15   DG Chest 2 View Result Date: 08/24/2023 CLINICAL DATA:  Abnormal white count EXAM: CHEST - 2 VIEW COMPARISON:  08/14/2023 FINDINGS: The heart size and mediastinal contours are within normal limits. Both lungs are clear. The visualized skeletal structures are unremarkable. IMPRESSION: No active cardiopulmonary disease. Electronically Signed   By: Jasmine Pang M.D.   On: 08/24/2023 21:07        Scheduled Meds:  atomoxetine  40 mg Oral Daily   And   atomoxetine  60 mg Oral Daily   ferrous sulfate  325 mg Oral Q breakfast   pantoprazole (PROTONIX) IV  40 mg Intravenous Q12H   vortioxetine HBr  20 mg Oral Daily   Continuous Infusions:  sodium chloride 75 mL/hr at 08/26/23 1117    ceFAZolin (ANCEF) IV     thiamine (VITAMIN B1) injection Stopped (08/26/23 1057)     LOS: 1 day    Time spent: 35 minutes    Beecher Furio A Maiah Sinning, MD [facility]   If 7PM-7AM, please contact night-coverage [email or web address]  08/26/2023, 11:31 AM

## 2023-08-26 NOTE — Plan of Care (Addendum)
Patient AO X 4, independent with mobility. VSS, no complaints of pain. MRI completed today (pending). Awaiting echo. ID following. UDS sent. IV antibiotics DC'd. Family at bedside. Awaiting stool sample for fecal occult.   Problem: Fluid Volume: Goal: Hemodynamic stability will improve Outcome: Progressing   Problem: Clinical Measurements: Goal: Diagnostic test results will improve Outcome: Progressing Goal: Signs and symptoms of infection will decrease Outcome: Progressing   Problem: Respiratory: Goal: Ability to maintain adequate ventilation will improve Outcome: Progressing   Problem: Education: Goal: Knowledge of General Education information will improve Description: Including pain rating scale, medication(s)/side effects and non-pharmacologic comfort measures Outcome: Progressing   Problem: Health Behavior/Discharge Planning: Goal: Ability to manage health-related needs will improve Outcome: Progressing   Problem: Clinical Measurements: Goal: Ability to maintain clinical measurements within normal limits will improve Outcome: Progressing Goal: Will remain free from infection Outcome: Progressing Goal: Diagnostic test results will improve Outcome: Progressing Goal: Respiratory complications will improve Outcome: Progressing Goal: Cardiovascular complication will be avoided Outcome: Progressing   Problem: Activity: Goal: Risk for activity intolerance will decrease Outcome: Progressing   Problem: Nutrition: Goal: Adequate nutrition will be maintained Outcome: Progressing   Problem: Coping: Goal: Level of anxiety will decrease Outcome: Progressing   Problem: Elimination: Goal: Will not experience complications related to bowel motility Outcome: Progressing Goal: Will not experience complications related to urinary retention Outcome: Progressing   Problem: Pain Management: Goal: General experience of comfort will improve Outcome: Progressing   Problem:  Safety: Goal: Ability to remain free from injury will improve Outcome: Progressing   Problem: Skin Integrity: Goal: Risk for impaired skin integrity will decrease Outcome: Progressing

## 2023-08-26 NOTE — TOC Initial Note (Signed)
Transition of Care [facility]) - Initial/Assessment Note    Patient Details  Name: Renee Mcdowell MRN: [id number] Date of Birth: Feb 18, 1996  Transition of Care [facility]) CM/SW Contact:    Lanier Clam, RN Phone Number: 08/26/2023, 12:42 PM  Clinical Narrative:d/c plan home.                   Expected Discharge Plan: Home/Self Care Barriers to Discharge: Continued Medical Work up   Patient Goals and CMS Choice Patient states their goals for this hospitalization and ongoing recovery are:: Home          Expected Discharge Plan and Services                                              Prior Living Arrangements/Services                       Activities of Daily Living   ADL Screening (condition at time of admission) Independently performs ADLs?: Yes (appropriate for developmental age) Is the patient deaf or have difficulty hearing?: No Does the patient have difficulty seeing, even when wearing glasses/contacts?: No Does the patient have difficulty concentrating, remembering, or making decisions?: No  Permission Sought/Granted                  Emotional Assessment              Admission diagnosis:  Pyelonephritis [N12] Thrombocytosis [D75.839] Patient Active Problem List   Diagnosis Date Noted   Thrombocytosis 08/25/2023   Anemia 08/25/2023   Bacteremia 08/25/2023   Prolonged QT interval 08/25/2023   Pyelonephritis 08/24/2023   Metabolic acidosis 08/14/2023   Severe sepsis (HCC) 08/14/2023   E coli bacteremia 08/14/2023   Depression with anxiety 08/09/2023   AKI (acute kidney injury) (HCC) 08/09/2023   Thrombocytopenia (HCC) 08/09/2023   Pyelonephritis of right kidney 08/08/2023   Major depressive disorder, recurrent severe without psychotic features (HCC) 12/26/2016   PTSD (post-traumatic stress disorder) 12/26/2016   Disorder of vitamin B12 11/21/2015   Social phobia 11/19/2015   Tobacco use disorder 11/19/2015   Alcohol use  disorder, mild, abuse 11/19/2015   Attention deficit hyperactivity disorder, predominantly inattentive type 10/15/2015   PCP:  [name], MD Pharmacy:   [facility] [place], [place] - [address] [facility] [address] [place] [address] Phone: [phone] Fax: [phone]  [id number] - [place], [place] - [address] AT [facility] OF [address] & [address] [place] [address] Phone: [phone] Fax: [phone]     Social Drivers of Health (SDOH) Social History: SDOH Screenings   Food Insecurity: No Food Insecurity (08/25/2023)  Housing: Low Risk  (08/25/2023)  Transportation Needs: No Transportation Needs (08/25/2023)  Utilities: Not At Risk (08/25/2023)  Alcohol Screen: Low Risk  (07/20/2017)  Tobacco Use: Medium Risk (08/25/2023)   SDOH Interventions:     Readmission Risk Interventions     No data to display

## 2023-08-27 ENCOUNTER — Observation Stay (HOSPITAL_COMMUNITY): Payer: BC Managed Care – PPO

## 2023-08-27 DIAGNOSIS — Z79899 Other long term (current) drug therapy: Secondary | ICD-10-CM | POA: Diagnosis not present

## 2023-08-27 DIAGNOSIS — M549 Dorsalgia, unspecified: Secondary | ICD-10-CM | POA: Diagnosis not present

## 2023-08-27 DIAGNOSIS — Z87891 Personal history of nicotine dependence: Secondary | ICD-10-CM | POA: Diagnosis not present

## 2023-08-27 DIAGNOSIS — E538 Deficiency of other specified B group vitamins: Secondary | ICD-10-CM | POA: Diagnosis not present

## 2023-08-27 DIAGNOSIS — R7881 Bacteremia: Secondary | ICD-10-CM | POA: Diagnosis not present

## 2023-08-27 DIAGNOSIS — N12 Tubulo-interstitial nephritis, not specified as acute or chronic: Secondary | ICD-10-CM | POA: Diagnosis not present

## 2023-08-27 DIAGNOSIS — D509 Iron deficiency anemia, unspecified: Secondary | ICD-10-CM | POA: Diagnosis not present

## 2023-08-27 DIAGNOSIS — A419 Sepsis, unspecified organism: Secondary | ICD-10-CM | POA: Diagnosis not present

## 2023-08-27 DIAGNOSIS — D75839 Thrombocytosis, unspecified: Secondary | ICD-10-CM | POA: Diagnosis not present

## 2023-08-27 DIAGNOSIS — G9341 Metabolic encephalopathy: Secondary | ICD-10-CM | POA: Diagnosis not present

## 2023-08-27 DIAGNOSIS — R9431 Abnormal electrocardiogram [ECG] [EKG]: Secondary | ICD-10-CM | POA: Diagnosis not present

## 2023-08-27 DIAGNOSIS — R6520 Severe sepsis without septic shock: Secondary | ICD-10-CM | POA: Diagnosis not present

## 2023-08-27 LAB — URINE CULTURE: Culture: >=100000 — AB

## 2023-08-27 LAB — URINE DRUGS OF ABUSE SCREEN W ALC, ROUTINE (REF LAB)
Amphetamines, Urine: NEGATIVE ng/mL
Barbiturate, Ur: NEGATIVE ng/mL
Benzodiazepine Quant, Ur: NEGATIVE ng/mL
Cannabinoid Quant, Ur: NEGATIVE ng/mL
Cocaine (Metab.): NEGATIVE ng/mL
Ethanol U, Quan: NEGATIVE %
Methadone Screen, Urine: NEGATIVE ng/mL
Opiate Quant, Ur: NEGATIVE ng/mL
Phencyclidine, Ur: NEGATIVE ng/mL
Propoxyphene, Urine: NEGATIVE ng/mL

## 2023-08-27 LAB — BASIC METABOLIC PANEL
Anion gap: 8 (ref 5–15)
BUN: 6 mg/dL (ref 6–20)
CO2: 24 mmol/L (ref 22–32)
Calcium: 8.4 mg/dL — ABNORMAL LOW (ref 8.9–10.3)
Chloride: 106 mmol/L (ref 98–111)
Creatinine, Ser: 1.21 mg/dL — ABNORMAL HIGH (ref 0.44–1.00)
GFR, Estimated: >60 mL/min (ref >60)
Glucose, Bld: 91 mg/dL (ref 70–99)
Potassium: 3.8 mmol/L (ref 3.5–5.1)
Sodium: 138 mmol/L (ref 135–145)

## 2023-08-27 LAB — ECHOCARDIOGRAM COMPLETE
AR max vel: 2.01 cm2
AV Area VTI: 2.23 cm2
AV Area mean vel: 1.9 cm2
AV Mean grad: 3 mm[Hg]
AV Peak grad: 5.4 mm[Hg]
Ao pk vel: 1.16 m/s
Area-P 1/2: 6.54 cm2
Calc EF: 62.4 %
Height: 66 in
MV VTI: 2.69 cm2
S' Lateral: 3.2 cm
Single Plane A2C EF: 61.5 %
Single Plane A4C EF: 66.4 %
Weight: 3033.53 [oz_av]

## 2023-08-27 LAB — CBC
HCT: 27.3 % — ABNORMAL LOW (ref 36.0–46.0)
Hemoglobin: 8.5 g/dL — ABNORMAL LOW (ref 12.0–15.0)
MCH: 28.2 pg (ref 26.0–34.0)
MCHC: 31.1 g/dL (ref 30.0–36.0)
MCV: 90.7 fL (ref 80.0–100.0)
Platelets: 662 10*3/uL — ABNORMAL HIGH (ref 150–400)
RBC: 3.01 MIL/uL — ABNORMAL LOW (ref 3.87–5.11)
RDW: 14 % (ref 11.5–15.5)
WBC: 9.1 10*3/uL (ref 4.0–10.5)
nRBC: 0 % (ref 0.0–0.2)

## 2023-08-27 LAB — HEPATITIS B SURFACE ANTIGEN: Hepatitis B Surface Ag: NONREACTIVE

## 2023-08-27 LAB — HEPATITIS A ANTIBODY, TOTAL: hep A Total Ab: NONREACTIVE

## 2023-08-27 MED ORDER — DIPHENHYDRAMINE HCL 25 MG PO CAPS: 25.0000 mg | ORAL_CAPSULE | Freq: Four times a day (QID) | ORAL | 0 refills | Status: AC | PRN

## 2023-08-27 MED ORDER — POLYSACCHARIDE IRON COMPLEX 150 MG PO CAPS: 150.0000 mg | ORAL_CAPSULE | Freq: Every day | ORAL | Status: DC

## 2023-08-27 MED ORDER — FAMOTIDINE 20 MG PO TABS
20.0000 mg | ORAL_TABLET | Freq: Once | ORAL | Status: AC
  Administered 2023-08-27: 20 mg via ORAL
  Filled 2023-08-27: qty 1

## 2023-08-27 MED ORDER — DIPHENHYDRAMINE HCL 25 MG PO CAPS
25.0000 mg | ORAL_CAPSULE | Freq: Four times a day (QID) | ORAL | Status: DC | PRN
  Administered 2023-08-27: 25 mg via ORAL
  Filled 2023-08-27: qty 1

## 2023-08-27 MED ORDER — FAMOTIDINE 20 MG PO TABS: 20.0000 mg | ORAL_TABLET | Freq: Every day | ORAL | 0 refills | Status: DC

## 2023-08-27 MED ORDER — FAMOTIDINE 20 MG PO TABS: 20.0000 mg | ORAL_TABLET | Freq: Every day | ORAL | Status: DC

## 2023-08-27 MED ORDER — HYDROCERIN EX CREA
TOPICAL_CREAM | Freq: Two times a day (BID) | CUTANEOUS | Status: DC
  Filled 2023-08-27: qty 113

## 2023-08-27 MED ORDER — PANTOPRAZOLE SODIUM 40 MG PO TBEC: 40.0000 mg | DELAYED_RELEASE_TABLET | Freq: Two times a day (BID) | ORAL | Status: DC

## 2023-08-27 MED ORDER — HYDROCERIN EX CREA: 1.0000 | TOPICAL_CREAM | Freq: Two times a day (BID) | CUTANEOUS | 0 refills | Status: AC

## 2023-08-27 MED ORDER — POLYSACCHARIDE IRON COMPLEX 150 MG PO CAPS: 150.0000 mg | ORAL_CAPSULE | Freq: Every day | ORAL | 3 refills | Status: AC

## 2023-08-27 NOTE — TOC Transition Note (Signed)
Transition of Care [facility]) - Discharge Note   Patient Details  Name: Renee Mcdowell MRN: [id number] Date of Birth: 09-03-1996  Transition [facility] Care [facility]) CM/SW Contact:  Adrian Prows, RN Phone Number: 08/27/2023, 4:06 PM   Clinical Narrative:    D/C orders received; no TOC needs.   Final next level of care: Home/Self Care Barriers to Discharge: No Barriers Identified   Patient Goals and CMS Choice Patient states their goals for this hospitalization and ongoing recovery are:: Home          Discharge Placement                       Discharge Plan and Services Additional resources added to the After Visit Summary for                                       Social Drivers of Health (SDOH) Interventions SDOH Screenings   Food Insecurity: No Food Insecurity (08/25/2023)  Housing: Low Risk  (08/25/2023)  Transportation Needs: No Transportation Needs (08/25/2023)  Utilities: Not At Risk (08/25/2023)  Alcohol Screen: Low Risk  (07/20/2017)  Tobacco Use: Medium Risk (08/25/2023)     Readmission Risk Interventions     No data to display

## 2023-08-27 NOTE — Plan of Care (Signed)
  Problem: Clinical Measurements: Goal: Signs and symptoms of infection will decrease Outcome: Progressing   Problem: Coping: Goal: Level of anxiety will decrease Outcome: Progressing   Problem: Activity: Goal: Risk for activity intolerance will decrease Outcome: Adequate for Discharge

## 2023-08-27 NOTE — Progress Notes (Signed)
  Echocardiogram 2D Echocardiogram has been performed.  Ocie Doyne RDCS 08/27/2023, 9:52 AM

## 2023-08-27 NOTE — Plan of Care (Signed)
  Problem: Fluid Volume: Goal: Hemodynamic stability will improve 08/27/2023 1604 by Westley Foots, RN Outcome: Adequate for Discharge 08/27/2023 1105 by Westley Foots, RN Outcome: Progressing   Problem: Clinical Measurements: Goal: Diagnostic test results will improve 08/27/2023 1604 by Westley Foots, RN Outcome: Adequate for Discharge 08/27/2023 1105 by Westley Foots, RN Outcome: Progressing Goal: Signs and symptoms of infection will decrease 08/27/2023 1604 by Westley Foots, RN Outcome: Adequate for Discharge 08/27/2023 1105 by Westley Foots, RN Outcome: Progressing   Problem: Respiratory: Goal: Ability to maintain adequate ventilation will improve 08/27/2023 1604 by Westley Foots, RN Outcome: Adequate for Discharge 08/27/2023 1105 by Westley Foots, RN Outcome: Progressing   Problem: Education: Goal: Knowledge of General Education information will improve Description: Including pain rating scale, medication(s)/side effects and non-pharmacologic comfort measures 08/27/2023 1604 by Westley Foots, RN Outcome: Adequate for Discharge 08/27/2023 1105 by Westley Foots, RN Outcome: Progressing   Problem: Health Behavior/Discharge Planning: Goal: Ability to manage health-related needs will improve 08/27/2023 1604 by Westley Foots, RN Outcome: Adequate for Discharge 08/27/2023 1105 by Westley Foots, RN Outcome: Progressing   Problem: Clinical Measurements: Goal: Ability to maintain clinical measurements within normal limits will improve 08/27/2023 1604 by Westley Foots, RN Outcome: Adequate for Discharge 08/27/2023 1105 by Westley Foots, RN Outcome: Progressing Goal: Will remain free from infection 08/27/2023 1604 by Westley Foots, RN Outcome: Adequate for Discharge 08/27/2023 1105 by Westley Foots, RN Outcome: Progressing Goal: Diagnostic test results will  improve 08/27/2023 1604 by Westley Foots, RN Outcome: Adequate for Discharge 08/27/2023 1105 by Westley Foots, RN Outcome: Progressing Goal: Respiratory complications will improve 08/27/2023 1604 by Westley Foots, RN Outcome: Adequate for Discharge 08/27/2023 1105 by Westley Foots, RN Outcome: Progressing Goal: Cardiovascular complication will be avoided 08/27/2023 1604 by Westley Foots, RN Outcome: Adequate for Discharge 08/27/2023 1105 by Westley Foots, RN Outcome: Progressing   Problem: Activity: Goal: Risk for activity intolerance will decrease 08/27/2023 1604 by Westley Foots, RN Outcome: Adequate for Discharge 08/27/2023 1105 by Westley Foots, RN Outcome: Progressing   Problem: Nutrition: Goal: Adequate nutrition will be maintained 08/27/2023 1604 by Westley Foots, RN Outcome: Adequate for Discharge 08/27/2023 1105 by Westley Foots, RN Outcome: Progressing   Problem: Coping: Goal: Level of anxiety will decrease 08/27/2023 1604 by Westley Foots, RN Outcome: Adequate for Discharge 08/27/2023 1105 by Westley Foots, RN Outcome: Progressing   Problem: Elimination: Goal: Will not experience complications related to bowel motility 08/27/2023 1604 by Westley Foots, RN Outcome: Adequate for Discharge 08/27/2023 1105 by Westley Foots, RN Outcome: Progressing Goal: Will not experience complications related to urinary retention 08/27/2023 1604 by Westley Foots, RN Outcome: Adequate for Discharge 08/27/2023 1105 by Westley Foots, RN Outcome: Progressing   Problem: Pain Management: Goal: General experience of comfort will improve 08/27/2023 1604 by Westley Foots, RN Outcome: Adequate for Discharge 08/27/2023 1105 by Westley Foots, RN Outcome: Progressing   Problem: Safety: Goal: Ability to remain free from injury will improve 08/27/2023 1604  by Westley Foots, RN Outcome: Adequate for Discharge 08/27/2023 1105 by Westley Foots, RN Outcome: Progressing   Problem: Skin Integrity: Goal: Risk for impaired skin integrity will decrease 08/27/2023 1604 by Westley Foots, RN Outcome: Adequate for Discharge 08/27/2023 1105 by Westley Foots, RN Outcome: Progressing

## 2023-08-27 NOTE — Plan of Care (Addendum)
Patient AO X 4, VSS, afebrile. Tele Dc'd. Ambulates independently in room. Some redness and itching in LLE and hands, benadryl given and cream applied. Appetite improving.   Problem: Fluid Volume: Goal: Hemodynamic stability will improve Outcome: Progressing   Problem: Clinical Measurements: Goal: Diagnostic test results will improve Outcome: Progressing Goal: Signs and symptoms of infection will decrease Outcome: Progressing   Problem: Respiratory: Goal: Ability to maintain adequate ventilation will improve Outcome: Progressing   Problem: Education: Goal: Knowledge of General Education information will improve Description: Including pain rating scale, medication(s)/side effects and non-pharmacologic comfort measures Outcome: Progressing   Problem: Health Behavior/Discharge Planning: Goal: Ability to manage health-related needs will improve Outcome: Progressing   Problem: Clinical Measurements: Goal: Ability to maintain clinical measurements within normal limits will improve Outcome: Progressing Goal: Will remain free from infection Outcome: Progressing Goal: Diagnostic test results will improve Outcome: Progressing Goal: Respiratory complications will improve Outcome: Progressing Goal: Cardiovascular complication will be avoided Outcome: Progressing   Problem: Activity: Goal: Risk for activity intolerance will decrease Outcome: Progressing   Problem: Nutrition: Goal: Adequate nutrition will be maintained Outcome: Progressing   Problem: Coping: Goal: Level of anxiety will decrease Outcome: Progressing   Problem: Elimination: Goal: Will not experience complications related to bowel motility Outcome: Progressing Goal: Will not experience complications related to urinary retention Outcome: Progressing   Problem: Pain Management: Goal: General experience of comfort will improve Outcome: Progressing   Problem: Safety: Goal: Ability to remain free from injury  will improve Outcome: Progressing   Problem: Skin Integrity: Goal: Risk for impaired skin integrity will decrease Outcome: Progressing

## 2023-08-27 NOTE — Progress Notes (Addendum)
Subjective: Pruritic rash on hands, feet and ? On face as well   Antibiotics:  Anti-infectives (From admission, onward)    Start     Dose/Rate Route Frequency Ordered Stop   08/26/23 1200  cefTRIAXone (ROCEPHIN) 2 g in sodium chloride 0.9 % 100 mL IVPB  Status:  Discontinued        2 g 200 mL/hr over 30 Minutes Intravenous Every 24 hours 08/26/23 0854 08/26/23 0858   08/26/23 1200  ceFAZolin (ANCEF) IVPB 2g/100 mL premix  Status:  Discontinued        2 g 200 mL/hr over 30 Minutes Intravenous Every 8 hours 08/26/23 0858 08/26/23 1308   08/25/23 0400  piperacillin-tazobactam (ZOSYN) IVPB 3.375 g  Status:  Discontinued        3.375 g 12.5 mL/hr over 240 Minutes Intravenous Every 8 hours 08/24/23 2331 08/26/23 0854   08/24/23 2100  piperacillin-tazobactam (ZOSYN) IVPB 3.375 g        3.375 g 100 mL/hr over 30 Minutes Intravenous  Once 08/24/23 2054 08/24/23 2158       Medications: Scheduled Meds:  atomoxetine  40 mg Oral Daily   And   atomoxetine  60 mg Oral Daily   feeding supplement  237 mL Oral BID BM   hydrocerin   Topical BID   [START ON 08/28/2023] iron polysaccharides  150 mg Oral Daily   pantoprazole  40 mg Oral BID   vortioxetine HBr  20 mg Oral Daily   Continuous Infusions:  sodium chloride 75 mL/hr at 08/27/23 1106   PRN Meds:.acetaminophen **OR** acetaminophen, diphenhydrAMINE, HYDROcodone-acetaminophen, mouth rinse    Objective: Weight change:   Intake/Output Summary (Last 24 hours) at 08/27/2023 1515 Last data filed at 08/27/2023 1106 Gross per 24 hour  Intake 1862.89 ml  Output --  Net 1862.89 ml   Blood pressure 135/89, pulse 78, temperature 97.7 F (36.5 C), temperature source Oral, resp. rate 18, height 5\' 6"  (1.676 m), weight 86 kg, last menstrual period 07/31/2023, SpO2 100%. Temp:  [97.7 F (36.5 C)-98.4 F (36.9 C)] 97.7 F (36.5 C) (12/21 1307) Pulse Rate:  [78-95] 78 (12/21 1307) Resp:  [16-20] 18 (12/21 1307) BP:  (116-135)/(75-89) 135/89 (12/21 1307) SpO2:  [99 %-100 %] 100 % (12/21 1307)  Physical Exam: Physical Exam Constitutional:      General: She is not in acute distress.    Appearance: She is well-developed. She is not diaphoretic.  HENT:     Head: Normocephalic and atraumatic.     Right Ear: External ear normal.     Left Ear: External ear normal.     Mouth/Throat:     Pharynx: No oropharyngeal exudate.  Eyes:     General: No scleral icterus.    Conjunctiva/sclera: Conjunctivae normal.     Pupils: Pupils are equal, round, and reactive to light.  Cardiovascular:     Rate and Rhythm: Normal rate and regular rhythm.     Heart sounds: No murmur heard. Pulmonary:     Effort: Pulmonary effort is normal. No respiratory distress.     Breath sounds: No wheezing or rales.  Abdominal:     General: There is no distension.  Musculoskeletal:        General: No tenderness. Normal range of motion.  Lymphadenopathy:     Cervical: No cervical adenopathy.  Skin:    General: Skin is warm and dry.     Coloration: Skin is not pale.     Findings:  No erythema or rash.  Neurological:     General: No focal deficit present.     Mental Status: She is alert and oriented to person, place, and time.     Motor: No abnormal muscle tone.     Coordination: Coordination normal.  Psychiatric:        Mood and Affect: Mood normal.        Behavior: Behavior normal.        Thought Content: Thought content normal.        Judgment: Judgment normal.    Skin         CBC:    BMET Recent Labs    08/26/23 0508 08/27/23 0753  NA 134* 138  K 3.6 3.8  CL 105 106  CO2 21* 24  GLUCOSE 95 91  BUN 7 6  CREATININE 1.34* 1.21*  CALCIUM 8.1* 8.4*     Liver Panel  Recent Labs    08/24/23 1740 08/25/23 0400  PROT 8.0 6.1*  ALBUMIN 3.5 2.7*  AST 15 13*  ALT 14 11  ALKPHOS 91 68  BILITOT 0.6 0.4       Sedimentation Rate No results for input(s): "ESRSEDRATE" in the last 72  hours. C-Reactive Protein No results for input(s): "CRP" in the last 72 hours.  Micro Results: Recent Results (from the past 720 hours)  Blood culture (routine x 2)     Status: Abnormal   Collection Time: 08/08/23  7:34 PM   Specimen: BLOOD  Result Value Ref Range Status   Specimen Description   Final    BLOOD LEFT ANTECUBITAL Performed at [facility], [address], [place], [place] [address]    Special Requests   Final    BOTTLES DRAWN AEROBIC AND ANAEROBIC Blood Culture adequate volume Performed at [facility], [address] [place], [place] [address]    Culture  Setup Time   Final    GRAM NEGATIVE RODS IN BOTH AEROBIC AND ANAEROBIC BOTTLES CRITICAL VALUE NOTED.  VALUE IS CONSISTENT WITH PREVIOUSLY REPORTED AND CALLED VALUE.    Culture (A)  Final    ESCHERICHIA COLI SUSCEPTIBILITIES PERFORMED ON PREVIOUS CULTURE WITHIN THE LAST 5 DAYS. Performed at [facility], [address], [place], [place] [address]    Report Status 08/11/2023 FINAL  Final  Blood culture (routine x 2)     Status: Abnormal   Collection Time: 08/08/23  7:34 PM   Specimen: BLOOD  Result Value Ref Range Status   Specimen Description   Final    BLOOD RIGHT ANTECUBITAL Performed at [facility], [address], [place], [place] [address]    Special Requests   Final    BOTTLES DRAWN AEROBIC AND ANAEROBIC Blood Culture adequate volume Performed at [facility], [address] [place], [place] [address]    Culture  Setup Time   Final    GRAM NEGATIVE RODS IN BOTH AEROBIC AND ANAEROBIC BOTTLES CRITICAL RESULT CALLED TO, READ BACK BY AND VERIFIED WITH: C MAS AT [facility] [id number] AT 933 AM BY CM CRITICAL RESULT CALLED TO, READ BACK BY AND VERIFIED WITH: PHARMD A PHAM [id number] AT 939 AM BY CM Performed at [facility], [address] [place], [place] [address]    Culture ESCHERICHIA COLI (A)  Final   Report Status  08/11/2023 FINAL  Final   Organism ID, Bacteria ESCHERICHIA COLI  Final   Organism ID, Bacteria ESCHERICHIA COLI  Final  Susceptibility   Escherichia coli - KIRBY BAUER*    CEFAZOLIN SENSITIVE Sensitive    Escherichia coli - MIC*    AMPICILLIN <=2 SENSITIVE Sensitive     CEFEPIME <=0.12 SENSITIVE Sensitive     CEFTAZIDIME <=1 SENSITIVE Sensitive     CEFTRIAXONE <=0.25 SENSITIVE Sensitive     CIPROFLOXACIN <=0.25 SENSITIVE Sensitive     GENTAMICIN <=1 SENSITIVE Sensitive     IMIPENEM <=0.25 SENSITIVE Sensitive     TRIMETH/SULFA <=20 SENSITIVE Sensitive     AMPICILLIN/SULBACTAM <=2 SENSITIVE Sensitive     PIP/TAZO <=4 SENSITIVE Sensitive ug/mL    * ESCHERICHIA COLI    ESCHERICHIA COLI  Blood Culture ID Panel (Reflexed)     Status: Abnormal   Collection Time: 08/08/23  7:34 PM  Result Value Ref Range Status   Enterococcus faecalis NOT DETECTED NOT DETECTED Final   Enterococcus Faecium NOT DETECTED NOT DETECTED Final   Listeria monocytogenes NOT DETECTED NOT DETECTED Final   Staphylococcus species NOT DETECTED NOT DETECTED Final   Staphylococcus aureus (BCID) NOT DETECTED NOT DETECTED Final   Staphylococcus epidermidis NOT DETECTED NOT DETECTED Final   Staphylococcus lugdunensis NOT DETECTED NOT DETECTED Final   Streptococcus species NOT DETECTED NOT DETECTED Final   Streptococcus agalactiae NOT DETECTED NOT DETECTED Final   Streptococcus pneumoniae NOT DETECTED NOT DETECTED Final   Streptococcus pyogenes NOT DETECTED NOT DETECTED Final   A.calcoaceticus-baumannii NOT DETECTED NOT DETECTED Final   Bacteroides fragilis NOT DETECTED NOT DETECTED Final   Enterobacterales DETECTED (A) NOT DETECTED Final    Comment: Enterobacterales represent a large order of gram negative bacteria, not a single organism. CRITICAL RESULT CALLED TO, READ BACK BY AND VERIFIED WITH: ED AT [facility] C MAS [id number] AT 933 AM BY CM    Enterobacter cloacae complex NOT DETECTED NOT DETECTED Final   Escherichia  coli DETECTED (A) NOT DETECTED Final    Comment: CRITICAL RESULT CALLED TO, READ BACK BY AND VERIFIED WITH:  ED AT [facility] C MAS [id number] AT 933 AM BY CM    Klebsiella aerogenes NOT DETECTED NOT DETECTED Final   Klebsiella oxytoca NOT DETECTED NOT DETECTED Final   Klebsiella pneumoniae NOT DETECTED NOT DETECTED Final   Proteus species NOT DETECTED NOT DETECTED Final   Salmonella species NOT DETECTED NOT DETECTED Final   Serratia marcescens NOT DETECTED NOT DETECTED Final   Haemophilus influenzae NOT DETECTED NOT DETECTED Final   Neisseria meningitidis NOT DETECTED NOT DETECTED Final   Pseudomonas aeruginosa NOT DETECTED NOT DETECTED Final   Stenotrophomonas maltophilia NOT DETECTED NOT DETECTED Final   Candida albicans NOT DETECTED NOT DETECTED Final   Candida auris NOT DETECTED NOT DETECTED Final   Candida glabrata NOT DETECTED NOT DETECTED Final   Candida krusei NOT DETECTED NOT DETECTED Final   Candida parapsilosis NOT DETECTED NOT DETECTED Final   Candida tropicalis NOT DETECTED NOT DETECTED Final   Cryptococcus neoformans/gattii NOT DETECTED NOT DETECTED Final   CTX-M ESBL NOT DETECTED NOT DETECTED Final   Carbapenem resistance IMP NOT DETECTED NOT DETECTED Final   Carbapenem resistance KPC NOT DETECTED NOT DETECTED Final   Carbapenem resistance NDM NOT DETECTED NOT DETECTED Final   Carbapenem resist OXA 48 LIKE NOT DETECTED NOT DETECTED Final   Carbapenem resistance VIM NOT DETECTED NOT DETECTED Final    Comment: Performed at [facility], [address], [place], [place] [address]  Urine Culture     Status: Abnormal   Collection Time: 08/08/23  7:35 PM  Specimen: Urine, Clean Catch  Result Value Ref Range Status   Specimen Description   Final    URINE, CLEAN CATCH Performed at [facility], [address] [place], [place] [address]    Special Requests   Final    NONE Performed at [facility], [address] [place],  [place] [address]    Culture (A)  Final    <10,000 COLONIES/mL INSIGNIFICANT GROWTH Performed at [facility], [address] [place], [place] [address]    Report Status 08/10/2023 FINAL  Final  Urine Culture (for pregnant, neutropenic or urologic patients or patients with an indwelling urinary catheter)     Status: Abnormal   Collection Time: 08/24/23  7:23 PM   Specimen: Urine, Clean Catch  Result Value Ref Range Status   Specimen Description   Final    URINE, CLEAN CATCH Performed at [facility], [address] [place], [place] [address]    Special Requests   Final    NONE Performed at [facility], [address] [place], [place] [address]    Culture >=100,000 COLONIES/mL ESCHERICHIA COLI (A)  Final   Report Status 08/27/2023 FINAL  Final   Organism ID, Bacteria ESCHERICHIA COLI (A)  Final      Susceptibility   Escherichia coli - MIC*    AMPICILLIN <=2 SENSITIVE Sensitive     CEFAZOLIN <=4 SENSITIVE Sensitive     CEFEPIME <=0.12 SENSITIVE Sensitive     CEFTRIAXONE <=0.25 SENSITIVE Sensitive     CIPROFLOXACIN <=0.25 SENSITIVE Sensitive     GENTAMICIN <=1 SENSITIVE Sensitive     IMIPENEM <=0.25 SENSITIVE Sensitive     NITROFURANTOIN <=16 SENSITIVE Sensitive     TRIMETH/SULFA <=20 SENSITIVE Sensitive     AMPICILLIN/SULBACTAM <=2 SENSITIVE Sensitive     PIP/TAZO <=4 SENSITIVE Sensitive ug/mL    * >=100,000 COLONIES/mL ESCHERICHIA COLI  Wet prep, genital     Status: None   Collection Time: 08/24/23  7:46 PM   Specimen: PATH Cytology Cervicovaginal Ancillary Only  Result Value Ref Range Status   Yeast Wet Prep HPF POC NONE SEEN NONE SEEN Final   Trich, Wet Prep NONE SEEN NONE SEEN Final   Clue Cells Wet Prep HPF POC NONE SEEN NONE SEEN Final   WBC, Wet Prep HPF POC <10 <10 Final    Comment: Swab received with less than 0.5 mL of saline, saline added to specimen, interpret results with caution.   Sperm NONE SEEN  Final    Comment:  Performed at [facility], [address] [place], [place] [address]  Blood culture (routine x 2)     Status: None (Preliminary result)   Collection Time: 08/24/23  9:57 PM   Specimen: BLOOD  Result Value Ref Range Status   Specimen Description   Final    BLOOD BLOOD RIGHT HAND Performed at [facility], [address] [place], [place] [address]    Special Requests   Final    BOTTLES DRAWN AEROBIC ONLY Blood Culture results may not be optimal due to an inadequate volume of blood received in culture bottles Performed at [place] [facility], [address], [place], [place] [address]    Culture   Final    NO GROWTH 3 DAYS Performed at [facility], [address], [place], [place] [address]    Report Status PENDING  Incomplete  Blood culture (routine x 2)     Status:  None (Preliminary result)   Collection Time: 08/25/23  4:23 AM   Specimen: BLOOD RIGHT ARM  Result Value Ref Range Status   Specimen Description   Final    BLOOD RIGHT ARM AEROBIC BOTTLE ONLY ANAEROBIC BOTTLE ONLY Performed at [facility], [address] [place], [place] [address]    Special Requests   Final    BOTTLES DRAWN AEROBIC AND ANAEROBIC Blood Culture adequate volume Performed at [facility], [address], [place], [place] [address]    Culture   Final    NO GROWTH 2 DAYS Performed at [facility], [address] [place], [place] [address]    Report Status PENDING  Incomplete    Studies/Results: ECHOCARDIOGRAM COMPLETE Result Date: 08/27/2023    ECHOCARDIOGRAM REPORT   Patient Name:   SHAMIKA BARCA Date of Exam: 08/27/2023 Medical Rec #:  [id number]        Height:       66.0 in Accession #:    [id number]       Weight:       189.6 lb Date of Birth:  [birth year]        BSA:          1.955 m Patient Age:    27 years         BP:           121/80 mmHg Patient Gender: F                HR:           71 bpm.  Exam Location:  Inpatient Procedure: 2D Echo, Cardiac Doppler and Color Doppler Indications:    Bacteremia  History:        Patient has no prior history of Echocardiogram examinations.  Sonographer:    Vern Claude Referring Phys: [id number] Kerri-Anne Haeberle N VAN DAM IMPRESSIONS  1. Left ventricular ejection fraction, by estimation, is 60 to 65%. The left ventricle has normal function. The left ventricle has no regional wall motion abnormalities. Left ventricular diastolic parameters were normal.  2. Right ventricular systolic function is normal. The right ventricular size is normal. Tricuspid regurgitation signal is inadequate for assessing PA pressure.  3. The mitral valve is normal in structure. Trivial mitral valve regurgitation. No evidence of mitral stenosis.  4. The aortic valve was not well visualized. Aortic valve regurgitation is not visualized. No aortic stenosis is present.  5. The inferior vena cava is normal in size with greater than 50% respiratory variability, suggesting right atrial pressure of 3 mmHg. FINDINGS  Left Ventricle: Left ventricular ejection fraction, by estimation, is 60 to 65%. The left ventricle has normal function. The left ventricle has no regional wall motion abnormalities. The left ventricular internal cavity size was normal in size. There is  no left ventricular hypertrophy. Left ventricular diastolic parameters were normal. Right Ventricle: The right ventricular size is normal. No increase in right ventricular wall thickness. Right ventricular systolic function is normal. Tricuspid regurgitation signal is inadequate for assessing PA pressure. Left Atrium: Left atrial size was normal in size. Right Atrium: Right atrial size was normal in size. Pericardium: There is no evidence of pericardial effusion. Mitral Valve: The mitral valve is normal in structure. Trivial mitral valve regurgitation. No evidence of mitral valve stenosis. MV peak gradient, 3.1 mmHg. The mean mitral valve gradient is  1.0 mmHg. Tricuspid Valve: The tricuspid valve is normal in structure. Tricuspid valve regurgitation is trivial. Aortic Valve: The  aortic valve was not well visualized. Aortic valve regurgitation is not visualized. No aortic stenosis is present. Aortic valve mean gradient measures 3.0 mmHg. Aortic valve peak gradient measures 5.4 mmHg. Aortic valve area, by VTI measures 2.23 cm. Pulmonic Valve: The pulmonic valve was not well visualized. Pulmonic valve regurgitation is trivial. Aorta: The aortic root and ascending aorta are structurally normal, with no evidence of dilitation. Venous: The inferior vena cava is normal in size with greater than 50% respiratory variability, suggesting right atrial pressure of 3 mmHg. IAS/Shunts: The interatrial septum was not well visualized.  LEFT VENTRICLE PLAX 2D LVIDd:         4.50 cm      Diastology LVIDs:         3.20 cm      LV e' medial:    9.68 cm/s LV PW:         0.60 cm      LV E/e' medial:  9.1 LV IVS:        0.50 cm      LV e' lateral:   12.60 cm/s LVOT diam:     1.80 cm      LV E/e' lateral: 7.0 LV SV:         52 LV SV Index:   26 LVOT Area:     2.54 cm  LV Volumes (MOD) LV vol d, MOD A2C: 106.0 ml LV vol d, MOD A4C: 121.0 ml LV vol s, MOD A2C: 40.8 ml LV vol s, MOD A4C: 40.6 ml LV SV MOD A2C:     65.2 ml LV SV MOD A4C:     121.0 ml LV SV MOD BP:      70.7 ml RIGHT VENTRICLE             IVC RV Basal diam:  3.30 cm     IVC diam: 1.10 cm RV Mid diam:    2.50 cm RV S prime:     11.70 cm/s TAPSE (M-mode): 2.5 cm LEFT ATRIUM             Index        RIGHT ATRIUM           Index LA diam:        3.60 cm 1.84 cm/m   RA Area:     13.50 cm LA Vol (A2C):   49.6 ml 25.37 ml/m  RA Volume:   31.60 ml  16.16 ml/m LA Vol (A4C):   58.9 ml 30.13 ml/m LA Biplane Vol: 55.1 ml 28.19 ml/m  AORTIC VALVE                    PULMONIC VALVE AV Area (Vmax):    2.01 cm     PV Vmax:       0.94 m/s AV Area (Vmean):   1.90 cm     PV Peak grad:  3.6 mmHg AV Area (VTI):     2.23 cm AV Vmax:            116.00 cm/s AV Vmean:          80.700 cm/s AV VTI:            0.232 m AV Peak Grad:      5.4 mmHg AV Mean Grad:      3.0 mmHg LVOT Vmax:         91.40 cm/s LVOT Vmean:        60.400 cm/s LVOT VTI:  0.203 m LVOT/AV VTI ratio: 0.88  AORTA Ao Root diam: 2.30 cm Ao Asc diam:  2.50 cm MITRAL VALVE MV Area (PHT): 6.54 cm    SHUNTS MV Area VTI:   2.69 cm    Systemic VTI:  0.20 m MV Peak grad:  3.1 mmHg    Systemic Diam: 1.80 cm MV Mean grad:  1.0 mmHg MV Vmax:       0.88 m/s MV Vmean:      52.5 cm/s MV Decel Time: 116 msec MV E velocity: 88.50 cm/s MV A velocity: 43.90 cm/s MV E/A ratio:  2.02 Epifanio Lesches MD Electronically signed by Epifanio Lesches MD Signature Date/Time: 08/27/2023/2:58:15 PM    Final    MR THORACIC SPINE W WO CONTRAST Result Date: 08/26/2023 CLINICAL DATA:  Mid-back pain, infection suspected, no prior imaging EXAM: MRI THORACIC WITHOUT AND WITH CONTRAST TECHNIQUE: Multiplanar and multiecho pulse sequences of the thoracic spine were obtained without and with intravenous contrast. CONTRAST:  8.76mL GADAVIST GADOBUTROL 1 MMOL/ML IV SOLN COMPARISON:  None Available. FINDINGS: Alignment:  Physiologic. Vertebrae: Vertebral body heights are maintained. No marrow edema to suggest acute fracture or discitis/osteomyelitis. No abnormal enhancement involving the vertebral bodies or discs. No suspicious bone lesions. Cord:  Normal cord signal.  No abnormal enhancement. Paraspinal and other soft tissues: Partially imaged suspected mild edema and enhancement at the right T4 posterior costovertebral joint (for example see series 23, image 1 and series 19, image 1 Disc levels: No significant disc protrusion, foraminal stenosis, or canal stenosis. No canal fluid collection. IMPRESSION: 1. Partially imaged suspected mild edema and enhancement at the right T4 posterior costovertebral joint which could be degenerative/inflammatory although an early infectious process is difficult to exclude.  2. Otherwise, unremarkable MRI of the thoracic spine. 3. No significant canal or foraminal stenosis. Electronically Signed   By: Feliberto Harts M.D.   On: 08/26/2023 19:02      Assessment/Plan:  INTERVAL HISTORY: rash as pictured above developed   Principal Problem:   Thrombocytosis Active Problems:   Tobacco use disorder   Alcohol use disorder, mild, abuse   Attention deficit hyperactivity disorder, predominantly inattentive type   Disorder of vitamin B12   Major depressive disorder, recurrent severe without psychotic features (HCC)   AKI (acute kidney injury) (HCC)   Severe sepsis (HCC)   Pyelonephritis   Anemia   Bacteremia   Prolonged QT interval    Renee Mcdowell is a 27 y.o. female with  recent E coli bacteremia likely from GI source, ? Cholecystitis, diverticulitis not seen on extensive workup, and then with apparently rebound thrombocytosis after having been TTpenic  We DC antibiotics (ancef) yesterday  Today she has developed a pruritic rash  #1 E coli bacteremia: prior hospitalization is cured  #3 Thrombocytosis: improving but would recheck CBC w PCP   #3 Rash: when I first heard about it I worried about syphilis, coxsackievirus and other thing and ordered labs for these as well as acute HIV, hepatitis  Could this be allergic rxn to protonix (which is new), delayed reaction to her ancef that was stopped   I think this can be managed with antihistamines and she may benefit from seeing an allergist down the road, she has PCP and also a Dermatologist she can follow-up with  I think she should be safe for DC home      LOS: 1 day   Acey Lav 08/27/2023, 3:15 PM

## 2023-08-27 NOTE — Discharge Summary (Signed)
Physician Discharge Summary   Patient: Renee Mcdowell MRN: [id number] DOB: 1996/05/01  Admit date:     08/24/2023  Discharge date: 08/27/23  Discharge Physician: Alba Cory   PCP: [name], MD   Recommendations at discharge:    Needs CBC to follow platelet level, and hb Follow up with Dr Bertis Ruddy for Iron deficiency anemia and Thrombocytosis.  Follow up with [facility] to discussed Endoscopy/colonoscopy in setting of iron deficiency and Weight loss. Also recent admission for E coli bacteremia, ID raise concern that Bacteremia could have been related to GI source, Cholecystis although all test were negative for GI source.   Discharge Diagnoses: Principal Problem:   Thrombocytosis Active Problems:   Major depressive disorder, recurrent severe without psychotic features (HCC)   Tobacco use disorder   Alcohol use disorder, mild, abuse   Attention deficit hyperactivity disorder, predominantly inattentive type   Disorder of vitamin B12   AKI (acute kidney injury) (HCC)   Severe sepsis (HCC)   Pyelonephritis   Anemia   Bacteremia   Prolonged QT interval  Resolved Problems:   * No resolved hospital problems. *  Hospital Course: 27 year old with past medical history significant for pyelonephritis, orthostatic syncope, depression, anxiety, ADHD, recent hospitalization for pyelonephritis discharged on oral antibiotics presents referred by her PCP due to abnormal labs she was found to have worsening anemia and thrombocytosis.  There is maybe some mention of bloody urine.  She also mention melena a week ago.  CT abdomen and pelvis performed in the ED showed mild perinephric edema on the right, possible pyelonephritis, no hydronephrosis.  Small nonobstructing kidney stone.  Clearing of pleural effusion and lower lobe consolidation.   She was found to have bacteruria, CT with some mild perinephric edema. She was initially started on IV antibiotics. ID consulted due to concern for  recurrent infection. She has just completed course for Pyelonephritis. Dr Daiva Eves didn't think patient has or had Pyelonephritis. She never had UTI symptoms. Other than shoulder blade pain. He think Previous Bacteremia could have been related to GI source cholecystitis , although previous HIDA was negative. Antibiotics were discontinue.   Her anemia is likely iron deficiency in setting of possible recent episode of melena, GI bleed. Dr Ewing Schlein was consulted, recommendation for out patient follow up.   Thrombocytosis, in setting iron deficiency anemia, rebound from recent thrombocytopenia and possible recent GI bleed. Thrombocytosis slowly resolving. She will follow up with Hematologist out patient.    Assessment and Plan: 1-Sepsis Ruled out.  Pyelonephritis Ruled out.  Present with persistent back, shoulder blade pain CT showed pyelonephritis UA: with 11-20 WBC.  She was Started IV antibiotics. Urine culture: growing E coli.  blood culture. No growth to date.  Reviewed case with urology, they don't see any indication for procedure. Recommend ID consultation.  Dr Daiva Eves consulted. Recommend to stop IV antibiotics. Proceed with MRI thoracic spine due to shoulder blade pain which is not explain by pyelo. Also recommend ECHO due to recent bacteremia. Echo negative, MRI: suspected mild edema and enhancement at the right T4 posterior costovertebral joint which could be degenerative/inflammatory although an early infectious process is difficult to exclude. Dr Daiva Eves was not concern with MRI result.  OK to discharge patient home today.  Discussed with patient, if she develops fever, chills, or dysuria, or shoulder pain she will need to return to hospital.      2-Anemia, iron deficiency.  Report Melena, 2 weeks ago.  GI consulted. Plan for  out patient follow up./ IV protonix IV BID.  Started iron supplement. Change to nu iro. Develops a rash of unclear etiology.  Hb improving.   Rash feet and hands.   Unclear etiology. Delay reaction to ancef ??? Or Protonix ? Ferrous sulfate ?  Change Protonix to Pepcid.  Ferrous sulfate to Nu iron.  Benadryl PRN.  Eucerin cream.   3-Thrombocytosis;  -could be in setting of iron deficiency and infection. Platelet count trendign down.  Discussed case with Dr Bertis Ruddy with hematology, Thrombocytosis could be related to iron deficiency anemia, recent GI bleed and also  reactive thrombocytosis from recent thrombocytopenia. She will help arrange out patient follow up./  Will need referral to hematology out patient.   improving.   4-Acute metabolic encephalopathy Has been sleepy most of time per mother, who was at bedside.  CT head negative  Neuro exam non focal.  She has been more alert. She is appropriately  answering questions.  Received IV Thiamine 3 days.    Hypomagnesemia;Replaced.    Major depressive disorder, recurrent without psychotic features: Continue with Trintellix   AKI: Present with a creatinine 1.4 improved from previous Continue with IV fluids   Alcohol use disorder: Currently denies   ADHD resume at discharge.  History of B12 deficiency: B12 normal range   History of E. coli bacteremia: Repeat blood cultures; no growth to date.  Unclear etiology, ID doent think it was related to pyelonephritis.  ECHO this admission negative.  MRI only showed mild inflammation T 4, patient denies pain. Could repeat MRI if develops pain or if PCP wishes to repeat for stability.   QT: Replete magnesium        Consultants: GI, ID Procedures performed: None Disposition: Home Diet recommendation:  Discharge Diet Orders (From admission, onward)     Start     Ordered   08/27/23 0000  Diet - low sodium heart healthy        08/27/23 1544           Regular diet DISCHARGE MEDICATION: Allergies as of 08/27/2023       Reactions   Azithromycin Hives        Medication List     STOP taking these medications    cyclobenzaprine  10 MG tablet Commonly known as: FLEXERIL   hydrOXYzine 25 MG tablet Commonly known as: ATARAX   ondansetron 4 MG disintegrating tablet Commonly known as: ZOFRAN-ODT   oxyCODONE 5 MG immediate release tablet Commonly known as: Oxy IR/ROXICODONE   predniSONE 10 MG (21) Tbpk tablet Commonly known as: STERAPRED UNI-PAK 21 TAB   tretinoin 0.025 % cream Commonly known as: RETIN-A   triamcinolone cream 0.5 % Commonly known as: KENALOG       TAKE these medications    amphetamine-dextroamphetamine 20 MG 24 hr capsule Commonly known as: ADDERALL XR Take 20 mg by mouth daily.   amphetamine-dextroamphetamine 20 MG tablet Commonly known as: ADDERALL Take 20 mg by mouth 2 (two) times daily.   atomoxetine 100 MG capsule Commonly known as: STRATTERA Take 10 mg by mouth daily.   diphenhydrAMINE 25 mg capsule Commonly known as: BENADRYL Take 1 capsule (25 mg total) by mouth every 6 (six) hours as needed for itching.   famotidine 20 MG tablet Commonly known as: PEPCID Take 1 tablet (20 mg total) by mouth daily. Start taking on: August 28, 2023   hydrocerin Crea Apply 1 Application topically 2 (two) times daily.   iron polysaccharides 150 MG capsule Commonly known  as: NIFEREX Take 1 capsule (150 mg total) by mouth daily. Start taking on: August 28, 2023   Trintellix 20 MG Tabs tablet Generic drug: vortioxetine HBr Take 20 mg by mouth daily.        Follow-up Information     Vida Rigger, MD Follow up in 2 week(s).   Specialty: Gastroenterology Contact information: [address] [place] [address] [phone]         Artis Delay, MD Follow up in 1 week(s).   Specialty: Hematology and Oncology Contact information: [address] [place] [address] [phone]                Discharge Exam: Ceasar Mons Weights   08/24/23 1724  Weight: 86 kg   General; NAD  Condition at discharge: stable  The results of  significant diagnostics from this hospitalization (including imaging, microbiology, ancillary and laboratory) are listed below for reference.   Imaging Studies: ECHOCARDIOGRAM COMPLETE Result Date: 08/27/2023    ECHOCARDIOGRAM REPORT   Patient Name:   ARIDAY WEICHMAN Date of Exam: 08/27/2023 Medical Rec #:  [id number]        Height:       66.0 in Accession #:    [id number]       Weight:       189.6 lb Date of Birth:  1995-12-11        BSA:          1.955 m Patient Age:    27 years         BP:           121/80 mmHg Patient Gender: F                HR:           71 bpm. Exam Location:  Inpatient Procedure: 2D Echo, Cardiac Doppler and Color Doppler Indications:    Bacteremia  History:        Patient has no prior history of Echocardiogram examinations.  Sonographer:    Vern Claude Referring Phys: [id number] COR[address]EL[address] N [address] IMPRESSIONS  1. Left ventricular ejection fraction, by estimation, is 60 to 65%. The left ventricle has normal function. The left ventricle has no regional wall motion abnormalities. Left ventricular diastolic parameters were normal.  2. Right ventricular systolic function is normal. The right ventricular size is normal. Tricuspid regurgitation signal is inadequate for assessing PA pressure.  3. The mitral valve is normal in structure. Trivial mitral valve regurgitation. No evidence of mitral stenosis.  4. The aortic valve was not well visualized. Aortic valve regurgitation is not visualized. No aortic stenosis is present.  5. The inferior vena cava is normal in size with greater than 50% respiratory variability, suggesting right atrial pressure of 3 mmHg. FINDINGS  Left Ventricle: Left ventricular ejection fraction, by estimation, is 60 to 65%. The left ventricle has normal function. The left ventricle has no regional wall motion abnormalities. The left ventricular internal cavity size was normal in size. There is  no left ventricular hypertrophy. Left ventricular diastolic parameters were  normal. Right Ventricle: The right ventricular size is normal. No increase in right ventricular wall thickness. Right ventricular systolic function is normal. Tricuspid regurgitation signal is inadequate for assessing PA pressure. Left Atrium: Left atrial size was normal in size. Right Atrium: Right atrial size was normal in size. Pericardium: There is no evidence of pericardial effusion. Mitral Valve: The mitral valve is normal in structure. Trivial mitral valve regurgitation.  No evidence of mitral valve stenosis. MV peak gradient, 3.1 mmHg. The mean mitral valve gradient is 1.0 mmHg. Tricuspid Valve: The tricuspid valve is normal in structure. Tricuspid valve regurgitation is trivial. Aortic Valve: The aortic valve was not well visualized. Aortic valve regurgitation is not visualized. No aortic stenosis is present. Aortic valve mean gradient measures 3.0 mmHg. Aortic valve peak gradient measures 5.4 mmHg. Aortic valve area, by VTI measures 2.23 cm. Pulmonic Valve: The pulmonic valve was not well visualized. Pulmonic valve regurgitation is trivial. Aorta: The aortic root and ascending aorta are structurally normal, with no evidence of dilitation. Venous: The inferior vena cava is normal in size with greater than 50% respiratory variability, suggesting right atrial pressure of 3 mmHg. IAS/Shunts: The interatrial septum was not well visualized.  LEFT VENTRICLE PLAX 2D LVIDd:         4.50 cm      Diastology LVIDs:         3.20 cm      LV e' medial:    9.68 cm/s LV PW:         0.60 cm      LV E/e' medial:  9.1 LV IVS:        0.50 cm      LV e' lateral:   12.60 cm/s LVOT diam:     1.80 cm      LV E/e' lateral: 7.0 LV SV:         52 LV SV Index:   26 LVOT Area:     2.54 cm  LV Volumes (MOD) LV vol d, MOD A2C: 106.0 ml LV vol d, MOD A4C: 121.0 ml LV vol s, MOD A2C: 40.8 ml LV vol s, MOD A4C: 40.6 ml LV SV MOD A2C:     65.2 ml LV SV MOD A4C:     121.0 ml LV SV MOD BP:      70.7 ml RIGHT VENTRICLE             IVC RV Basal  diam:  3.30 cm     IVC diam: 1.10 cm RV Mid diam:    2.50 cm RV S prime:     11.70 cm/s TAPSE (M-mode): 2.5 cm LEFT ATRIUM             Index        RIGHT ATRIUM           Index LA diam:        3.60 cm 1.84 cm/m   RA Area:     13.50 cm LA Vol (A2C):   49.6 ml 25.37 ml/m  RA Volume:   31.60 ml  16.16 ml/m LA Vol (A4C):   58.9 ml 30.13 ml/m LA Biplane Vol: 55.1 ml 28.19 ml/m  AORTIC VALVE                    PULMONIC VALVE AV Area (Vmax):    2.01 cm     PV Vmax:       0.94 m/s AV Area (Vmean):   1.90 cm     PV Peak grad:  3.6 mmHg AV Area (VTI):     2.23 cm AV Vmax:           116.00 cm/s AV Vmean:          80.700 cm/s AV VTI:            0.232 m AV Peak Grad:      5.4 mmHg AV Mean  Grad:      3.0 mmHg LVOT Vmax:         91.40 cm/s LVOT Vmean:        60.400 cm/s LVOT VTI:          0.203 m LVOT/AV VTI ratio: 0.88  AORTA Ao Root diam: 2.30 cm Ao Asc diam:  2.50 cm MITRAL VALVE MV Area (PHT): 6.54 cm    SHUNTS MV Area VTI:   2.69 cm    Systemic VTI:  0.20 m MV Peak grad:  3.1 mmHg    Systemic Diam: 1.80 cm MV Mean grad:  1.0 mmHg MV Vmax:       0.88 m/s MV Vmean:      52.5 cm/s MV Decel Time: 116 msec MV E velocity: 88.50 cm/s MV A velocity: 43.90 cm/s MV E/A ratio:  2.02 Epifanio Lesches MD Electronically signed by Epifanio Lesches MD Signature Date/Time: 08/27/2023/2:58:15 PM    Final    MR THORACIC SPINE W WO CONTRAST Result Date: 08/26/2023 CLINICAL DATA:  Mid-back pain, infection suspected, no prior imaging EXAM: MRI THORACIC WITHOUT AND WITH CONTRAST TECHNIQUE: Multiplanar and multiecho pulse sequences of the thoracic spine were obtained without and with intravenous contrast. CONTRAST:  8.33mL GADAVIST GADOBUTROL 1 MMOL/ML IV SOLN COMPARISON:  None Available. FINDINGS: Alignment:  Physiologic. Vertebrae: Vertebral body heights are maintained. No marrow edema to suggest acute fracture or discitis/osteomyelitis. No abnormal enhancement involving the vertebral bodies or discs. No suspicious bone  lesions. Cord:  Normal cord signal.  No abnormal enhancement. Paraspinal and other soft tissues: Partially imaged suspected mild edema and enhancement at the right T4 posterior costovertebral joint (for example see series 23, image 1 and series 19, image 1 Disc levels: No significant disc protrusion, foraminal stenosis, or canal stenosis. No canal fluid collection. IMPRESSION: 1. Partially imaged suspected mild edema and enhancement at the right T4 posterior costovertebral joint which could be degenerative/inflammatory although an early infectious process is difficult to exclude. 2. Otherwise, unremarkable MRI of the thoracic spine. 3. No significant canal or foraminal stenosis. Electronically Signed   By: Feliberto Harts M.D.   On: 08/26/2023 19:02   CT HEAD WO CONTRAST ( ) Result Date: 08/25/2023 CLINICAL DATA:  Altered mental status. EXAM: CT HEAD WITHOUT CONTRAST TECHNIQUE: Contiguous axial images were obtained from the base of the skull through the vertex without intravenous contrast. RADIATION DOSE REDUCTION: This exam was performed according to the departmental dose-optimization program which includes automated exposure control, adjustment of the mA and/or kV according to patient size and/or use of iterative reconstruction technique. COMPARISON:  None Available. FINDINGS: Brain: There is no evidence of an acute infarct, intracranial hemorrhage, mass, midline shift, or extra-axial fluid collection. The ventricles and sulci are normal. Vascular: No hyperdense vessel. Skull: No acute fracture or suspicious osseous lesion. Sinuses/Orbits: Visualized paranasal sinuses and mastoid air cells are clear. Unremarkable orbits. Other: None. IMPRESSION: Negative head CT. Electronically Signed   By: Sebastian Ache M.D.   On: 08/25/2023 12:06   CT ABDOMEN PELVIS WO CONTRAST Result Date: 08/24/2023 CLINICAL DATA:  Abnormal white count EXAM: CT ABDOMEN AND PELVIS WITHOUT CONTRAST TECHNIQUE: Multidetector CT imaging  of the abdomen and pelvis was performed following the standard protocol without IV contrast. RADIATION DOSE REDUCTION: This exam was performed according to the departmental dose-optimization program which includes automated exposure control, adjustment of the mA and/or kV according to patient size and/or use of iterative reconstruction technique. COMPARISON:  CT 08/10/2023 FINDINGS: Lower chest: No acute abnormality. Clearing  of pleural effusions and lower lobe consolidations. Hepatobiliary: Contracted gallbladder. No calcified stone or biliary dilatation Pancreas: Unremarkable. No pancreatic ductal dilatation or surrounding inflammatory changes. Spleen: Normal in size without focal abnormality. Adrenals/Urinary Tract: Adrenal glands are within normal limits. Small nonobstructing kidney stones. Mild perinephric edema on the right. The bladder is unremarkable Stomach/Bowel: Stomach is within normal limits. Appendix appears normal. No evidence of bowel wall thickening, distention, or inflammatory changes. Vascular/Lymphatic: No significant vascular findings are present. No enlarged abdominal or pelvic lymph nodes. Reproductive: Uterus and bilateral adnexa are unremarkable. Other: No abdominal wall hernia or abnormality. No abdominopelvic ascites. Musculoskeletal: No acute or significant osseous findings. IMPRESSION: 1. Mild perinephric edema on the right, possible pyelonephritis. No hydronephrosis. Small nonobstructing kidney stones. 2. Clearing of pleural effusions and lower lobe consolidations. Electronically Signed   By: Jasmine Pang M.D.   On: 08/24/2023 21:15   DG Chest 2 View Result Date: 08/24/2023 CLINICAL DATA:  Abnormal white count EXAM: CHEST - 2 VIEW COMPARISON:  08/14/2023 FINDINGS: The heart size and mediastinal contours are within normal limits. Both lungs are clear. The visualized skeletal structures are unremarkable. IMPRESSION: No active cardiopulmonary disease. Electronically Signed   By: Jasmine Pang M.D.   On: 08/24/2023 21:07   DG CHEST PORT 1 VIEW Result Date: 08/14/2023 CLINICAL DATA:  Chest pain. EXAM: PORTABLE CHEST 1 VIEW COMPARISON:  08/08/2023. FINDINGS: Unremarkable cardiac silhouette. Left base consolidation and small effusion. No pneumothorax. Normal pulmonary vasculature. Osseous structures unremarkable. IMPRESSION: Left base consolidation or volume loss and small effusion. Electronically Signed   By: Layla Maw M.D.   On: 08/14/2023 11:00   NM Pulmonary Perf and Vent Result Date: 08/11/2023 CLINICAL DATA:  Elevated D-dimer. Short of breath. Low saturations. Concern for pulmonary embolism. EXAM: NUCLEAR MEDICINE PERFUSION LUNG SCAN TECHNIQUE: Perfusion images were obtained in multiple projections after intravenous injection of radiopharmaceutical. Ventilation scans intentionally deferred if perfusion scan and chest x-ray adequate for interpretation during COVID 19 epidemic. RADIOPHARMACEUTICALS:  4.3 mCi Tc-49m MAA IV COMPARISON:  Chest radiograph 08/08/2023, CT abdomen 08/10/2023 FINDINGS: No wedge-shaped peripheral perfusion defects in the LEFT or RIGHT lung to suggest acute pulmonary embolism. IMPRESSION: No evidence acute pulmonary embolism. Electronically Signed   By: Genevive Bi M.D.   On: 08/11/2023 10:25   CT ABDOMEN PELVIS WO CONTRAST Result Date: 08/10/2023 CLINICAL DATA:  Right lower quadrant abdominal pain. EXAM: CT ABDOMEN AND PELVIS WITHOUT CONTRAST TECHNIQUE: Multidetector CT imaging of the abdomen and pelvis was performed following the standard protocol without IV contrast. RADIATION DOSE REDUCTION: This exam was performed according to the departmental dose-optimization program which includes automated exposure control, adjustment of the mA and/or kV according to patient size and/or use of iterative reconstruction technique. COMPARISON:  August 08, 2023. FINDINGS: Lower chest: Small pleural effusions are noted with adjacent subsegmental atelectasis or  infiltrates. Hepatobiliary: Hepatic steatosis. No cholelithiasis or biliary dilatation. Pancreas: Unremarkable. No pancreatic ductal dilatation or surrounding inflammatory changes. Spleen: Normal in size without focal abnormality. Adrenals/Urinary Tract: Adrenal glands appear normal. Bilateral nonobstructive nephrolithiasis is noted. No definite hydronephrosis or renal obstruction is noted. Urinary bladder is unremarkable. Stomach/Bowel: Stomach is within normal limits. Appendix appears normal. No evidence of bowel wall thickening, distention, or inflammatory changes. Vascular/Lymphatic: No significant vascular findings are present. No enlarged abdominal or pelvic lymph nodes. Reproductive: Uterus and bilateral adnexa are unremarkable. Other: Small amount of free fluid is noted in the pelvis which may be physiologic. No hernia is noted. Musculoskeletal: No acute or  significant osseous findings. IMPRESSION: Small pleural effusions are noted with adjacent subsegmental atelectasis or infiltrates. Bilateral nonobstructive nephrolithiasis. Hepatic steatosis. Small amount of free fluid is noted in the pelvis which may be physiologic. Electronically Signed   By: Lupita Raider M.D.   On: 08/10/2023 15:20   NM Hepatobiliary Liver Func Result Date: 08/10/2023 CLINICAL DATA:  Right upper quadrant abdominal pain. Evaluate for acute cholecystitis. EXAM: NUCLEAR MEDICINE HEPATOBILIARY IMAGING TECHNIQUE: Sequential images of the abdomen were obtained out to 60 minutes following intravenous administration of radiopharmaceutical. RADIOPHARMACEUTICALS:  5.4 mCi Tc-86m  Choletec IV COMPARISON:  CT abdomen and pelvis-08/08/2019; right upper quadrant abdominal pain-08/08/2023 FINDINGS: There is homogeneous distribution of injected radiotracer throughout the hepatic parenchyma. There is early excretion of radiotracer with opacification of the gallbladder, initially seen on the 10 minute anterior projection of the image. There is  early excretion of radiotracer with opacification of the proximal small bowel, initially seen on the 30 minute anterior projection planar image. IMPRESSION: No scintigraphic evidence of cystic duct obstruction/acute cholecystitis. Electronically Signed   By: Simonne Come M.D.   On: 08/10/2023 10:36   DG Chest Portable 1 View Result Date: 08/09/2023 CLINICAL DATA:  Right shoulder pain. EXAM: PORTABLE CHEST 1 VIEW COMPARISON:  November 14, 2019 FINDINGS: The heart size and mediastinal contours are within normal limits. Mildly decreased lung volumes are seen with mild elevation of the right hemidiaphragm. There is no evidence of an acute infiltrate, pleural effusion or pneumothorax. The visualized skeletal structures are unremarkable. IMPRESSION: No active disease. Electronically Signed   By: Aram Candela M.D.   On: 08/09/2023 02:20   CT Renal Stone Study Result Date: 08/08/2023 CLINICAL DATA:  Right-sided abdominal pain and right-sided back pain. EXAM: CT ABDOMEN AND PELVIS WITHOUT CONTRAST TECHNIQUE: Multidetector CT imaging of the abdomen and pelvis was performed following the standard protocol without IV contrast. RADIATION DOSE REDUCTION: This exam was performed according to the departmental dose-optimization program which includes automated exposure control, adjustment of the mA and/or kV according to patient size and/or use of iterative reconstruction technique. COMPARISON:  July 15, 2021 FINDINGS: Lower chest: Mild atelectatic changes are seen within the posterior aspect of the left lung base. Hepatobiliary: No focal liver abnormality is seen. The gallbladder is contracted without evidence of gallstones, gallbladder wall thickening, or biliary dilatation. Pancreas: Unremarkable. No pancreatic ductal dilatation or surrounding inflammatory changes. Spleen: Normal in size without focal abnormality. Adrenals/Urinary Tract: Adrenal glands are unremarkable. There is mild asymmetric enlargement of the right  kidney without focal lesions, hydronephrosis or obstructing renal calculi. 2 mm and 3 mm nonobstructing renal calculi are seen within the mid and lower right kidney. There is mild right-sided perinephric inflammatory fat stranding. Bladder is unremarkable. Stomach/Bowel: Stomach is within normal limits. The appendix is not clearly identified. No evidence of bowel dilatation. Mild inflammatory fat stranding is seen along the posterolateral aspect of the right lower quadrant and pelvis on the right. Vascular/Lymphatic: No significant vascular findings are present. No enlarged abdominal or pelvic lymph nodes. Reproductive: Uterus and bilateral adnexa are unremarkable. Other: No abdominal wall hernia or abnormality. A very small amount of posterior pelvic free fluid is seen. Musculoskeletal: No acute or significant osseous findings. IMPRESSION: 1. Mild asymmetric enlargement of the right kidney with mild right-sided perinephric inflammatory fat stranding. While this may be secondary to a recently passed renal stone, sequelae associated with acute pyelonephritis cannot be excluded. Correlation with urinalysis is recommended. 2. 2 mm and 3 mm nonobstructing right renal  calculi. 3. Mild inflammatory fat stranding along the posterolateral aspect of the right lower quadrant and pelvis on the right. Without visualization of a normal appendix, appendicitis cannot be excluded. Further evaluation with follow-up abdomen pelvis CT following oral and intravenous contrast is recommended if appendicitis is of clinical concern. 4. Very small amount of posterior pelvic free fluid, likely physiologic. Electronically Signed   By: Aram Candela M.D.   On: 08/08/2023 22:44   US Abdomen Limited RUQ (LIVER/GB) Result Date: 08/08/2023 CLINICAL DATA:  Right upper quadrant pain and back pain. EXAM: ULTRASOUND ABDOMEN LIMITED RIGHT UPPER QUADRANT COMPARISON:  None Available. FINDINGS: Gallbladder: The gallbladder is partially contracted  with gallbladder wall thickness of 2.6 mm. No gallstones are visualized. A mild amount of pericholecystic fluid is noted. No sonographic Murphy sign noted by sonographer. Common bile duct: Diameter: 4.7 mm Liver: No focal lesion identified. Within normal limits in parenchymal echogenicity. Portal vein is patent on color Doppler imaging with normal direction of blood flow towards the liver. Other: None. IMPRESSION: 1. Partially contracted gallbladder with subsequent gallbladder wall thickening and mild pericholecystic fluid. 2. No evidence of cholelithiasis or acute cholecystitis. Electronically Signed   By: Aram Candela M.D.   On: 08/08/2023 22:38    Microbiology: Results for orders placed or performed during the hospital encounter of 08/24/23  Urine Culture (for pregnant, neutropenic or urologic patients or patients with an indwelling urinary catheter)     Status: Abnormal   Collection Time: 08/24/23  7:23 PM   Specimen: Urine, Clean Catch  Result Value Ref Range Status   Specimen Description   Final    URINE, CLEAN CATCH Performed at [facility], [address] [place], [place] [address]    Special Requests   Final    NONE Performed at [facility], [address] [place], [place] [address]    Culture >=100,000 COLONIES/mL ESCHERICHIA COLI (A)  Final   Report Status 08/27/2023 FINAL  Final   Organism ID, Bacteria ESCHERICHIA COLI (A)  Final      Susceptibility   Escherichia coli - MIC*    AMPICILLIN <=2 SENSITIVE Sensitive     CEFAZOLIN <=4 SENSITIVE Sensitive     CEFEPIME <=0.12 SENSITIVE Sensitive     CEFTRIAXONE <=0.25 SENSITIVE Sensitive     CIPROFLOXACIN <=0.25 SENSITIVE Sensitive     GENTAMICIN <=1 SENSITIVE Sensitive     IMIPENEM <=0.25 SENSITIVE Sensitive     NITROFURANTOIN <=16 SENSITIVE Sensitive     TRIMETH/SULFA <=20 SENSITIVE Sensitive     AMPICILLIN/SULBACTAM <=2 SENSITIVE Sensitive     PIP/TAZO <=4 SENSITIVE Sensitive  ug/mL    * >=100,000 COLONIES/mL ESCHERICHIA COLI  Wet prep, genital     Status: None   Collection Time: 08/24/23  7:46 PM   Specimen: PATH Cytology Cervicovaginal Ancillary Only  Result Value Ref Range Status   Yeast Wet Prep HPF POC NONE SEEN NONE SEEN Final   Trich, Wet Prep NONE SEEN NONE SEEN Final   Clue Cells Wet Prep HPF POC NONE SEEN NONE SEEN Final   WBC, Wet Prep HPF POC <10 <10 Final    Comment: Swab received with less than 0.5 mL of saline, saline added to specimen, interpret results with caution.   Sperm NONE SEEN  Final    Comment: Performed at [facility], [address] [place], [place] [address]  Blood culture (routine x 2)     Status: None (Preliminary result)   Collection Time: 08/24/23  9:57 PM   Specimen: BLOOD  Result Value Ref Range Status   Specimen Description   Final    BLOOD BLOOD RIGHT HAND Performed at [facility], [address], [place], [place] [address]    Special Requests   Final    BOTTLES DRAWN AEROBIC ONLY Blood Culture results may not be optimal due to an inadequate volume of blood received in culture bottles Performed at [facility], [address] [place], [place] [address]    Culture   Final    NO GROWTH 3 DAYS Performed at [facility], [address], [place], [place] [address]    Report Status PENDING  Incomplete  Blood culture (routine x 2)     Status: None (Preliminary result)   Collection Time: 08/25/23  4:23 AM   Specimen: BLOOD RIGHT ARM  Result Value Ref Range Status   Specimen Description   Final    BLOOD RIGHT ARM AEROBIC BOTTLE ONLY ANAEROBIC BOTTLE ONLY Performed at [facility], [address] [place], [place] [address]    Special Requests   Final    BOTTLES DRAWN AEROBIC AND ANAEROBIC Blood Culture adequate volume Performed at [facility], [address] [place], [place] [address]    Culture   Final    NO  GROWTH 2 DAYS Performed at [facility], [address], [place], [place] [address]    Report Status PENDING  Incomplete    Labs: CBC: Recent Labs  Lab 08/25/23 0050 08/25/23 0400 08/25/23 1135 08/26/23 0508 08/27/23 0753  WBC 8.8 8.3 9.5 9.5 9.1  NEUTROABS 5.9  --   --   --   --   HGB 7.9* 7.3* 7.5* 8.1* 8.5*  HCT 25.2* 24.1* 23.6* 26.6* 27.3*  MCV 89.4 90.6 89.1 92.0 90.7  PLT 941* 879* 759* 784* 662*   Basic Metabolic Panel: Recent Labs  Lab 08/24/23 1740 08/25/23 0400 08/26/23 0508 08/27/23 0753  NA 138 139 134* 138  K 3.6 3.5 3.6 3.8  CL 105 111 105 106  CO2 22 21* 21* 24  GLUCOSE 89 96 95 91  BUN 12 8 7 6   CREATININE 1.45* 1.17* 1.34* 1.21*  CALCIUM 9.2 7.8* 8.1* 8.4*  MG  --  1.8  --   --   PHOS  --  3.0  --   --    Liver Function Tests: Recent Labs  Lab 08/24/23 1740 08/25/23 0400  AST 15 13*  ALT 14 11  ALKPHOS 91 68  BILITOT 0.6 0.4  PROT 8.0 6.1*  ALBUMIN 3.5 2.7*   CBG: No results for input(s): "GLUCAP" in the last 168 hours.  Discharge time spent: greater than 30 minutes.  Signed: Alba Cory, MD [facility] 08/27/2023

## 2023-08-27 NOTE — Progress Notes (Signed)
The patient is receiving Protonix by the intravenous route.  Based on criteria approved by the Pharmacy and Therapeutics Committee and the Medical Executive Committee, the medication is being converted to the equivalent oral dose form.  These criteria include: -No active GI bleeding -Able to tolerate diet of full liquids (or better) or tube feeding -Able to tolerate other medications by the oral or enteral route  If you have any questions about this conversion, please contact the Pharmacy Department (phone [phone]).  Thank you. 

## 2023-08-28 LAB — HIV-1 RNA QUANT-NO REFLEX-BLD
HIV 1 RNA Quant: <20 {copies}/mL
LOG10 HIV-1 RNA: UNDETERMINED {Log}

## 2023-08-28 LAB — RPR: RPR Ser Ql: NONREACTIVE

## 2023-08-29 LAB — COXSACKIE A VIRUS ANTIBODIES
Coxsackie A16 IgG: 1:400 {titer} — ABNORMAL HIGH
Coxsackie A16 IgM: NEGATIVE {titer}
Coxsackie A24 IgG: 1:400 {titer} — ABNORMAL HIGH
Coxsackie A24 IgM: NEGATIVE {titer}
Coxsackie A7 IgG: 1:400 {titer} — ABNORMAL HIGH
Coxsackie A7 IgM: NEGATIVE {titer}
Coxsackie A9 IgG: 1:400 {titer} — ABNORMAL HIGH
Coxsackie A9 IgM: NEGATIVE {titer}

## 2023-08-29 LAB — CULTURE, BLOOD (ROUTINE X 2): Culture: NO GROWTH

## 2023-08-30 LAB — CULTURE, BLOOD (ROUTINE X 2)
Culture: NO GROWTH
Special Requests: ADEQUATE

## 2023-08-30 LAB — HEPATITIS B SURFACE ANTIBODY, QUANTITATIVE: Hep B S AB Quant (Post): <3.5 m[IU]/mL — ABNORMAL LOW

## 2023-08-30 LAB — HCV AB W REFLEX TO QUANT PCR: HCV Ab: NONREACTIVE

## 2023-08-30 LAB — HCV INTERPRETATION

## 2023-08-31 LAB — COXSACKIE B VIRUS ANTIBODIES
Coxsackie B1 Ab: 1:500 {titer} — ABNORMAL HIGH
Coxsackie B2 Ab: 1:100 {titer} — ABNORMAL HIGH
Coxsackie B3 Ab: 1:100 {titer} — ABNORMAL HIGH
Coxsackie B4 Ab: 1:500 {titer} — ABNORMAL HIGH
Coxsackie B5 Ab: 1:1000 {titer} — ABNORMAL HIGH
Coxsackie B6 Ab: 1:100 {titer} — ABNORMAL HIGH

## 2023-09-01 ENCOUNTER — Telehealth: Payer: Self-pay

## 2023-09-01 DIAGNOSIS — D649 Anemia, unspecified: Secondary | ICD-10-CM | POA: Diagnosis not present

## 2023-09-01 DIAGNOSIS — N179 Acute kidney failure, unspecified: Secondary | ICD-10-CM | POA: Diagnosis not present

## 2023-09-01 NOTE — Telephone Encounter (Signed)
Patient advised of results and verbalized understanding. Patient had no questions Danika Kluender Jonathon Resides, CMA

## 2023-09-01 NOTE — Telephone Encounter (Signed)
-----   Message from Braxton sent at 09/01/2023  3:12 PM EST ----- Coxsackie antibodis + likely tue to ast infection. I had ordered them when she had a rash in the hospital but I dont think her rash was from this virus. I think this is old infection ----- Message ----- From: Interface, Lab In [place] Sent: 08/27/2023   6:06 PM EST To: Randall Hiss, MD

## 2023-09-12 DIAGNOSIS — D696 Thrombocytopenia, unspecified: Secondary | ICD-10-CM | POA: Diagnosis not present

## 2023-09-12 DIAGNOSIS — R300 Dysuria: Secondary | ICD-10-CM | POA: Diagnosis not present

## 2023-09-12 DIAGNOSIS — K5909 Other constipation: Secondary | ICD-10-CM | POA: Diagnosis not present

## 2023-09-12 DIAGNOSIS — D509 Iron deficiency anemia, unspecified: Secondary | ICD-10-CM | POA: Diagnosis not present

## 2023-09-12 DIAGNOSIS — Z8719 Personal history of other diseases of the digestive system: Secondary | ICD-10-CM | POA: Diagnosis not present

## 2023-10-10 ENCOUNTER — Inpatient Hospital Stay: Payer: BC Managed Care – PPO | Admitting: Nurse Practitioner

## 2023-10-10 ENCOUNTER — Encounter: Payer: Self-pay | Admitting: Nurse Practitioner

## 2023-10-10 ENCOUNTER — Inpatient Hospital Stay: Payer: BC Managed Care – PPO | Attending: Nurse Practitioner | Admitting: Nurse Practitioner

## 2023-10-10 VITALS — BP 117/83 | HR 94 | Temp 98.3°F | Resp 15 | Wt 153.1 lb

## 2023-10-10 DIAGNOSIS — F17290 Nicotine dependence, other tobacco product, uncomplicated: Secondary | ICD-10-CM | POA: Insufficient documentation

## 2023-10-10 DIAGNOSIS — F909 Attention-deficit hyperactivity disorder, unspecified type: Secondary | ICD-10-CM | POA: Diagnosis not present

## 2023-10-10 DIAGNOSIS — D75839 Thrombocytosis, unspecified: Secondary | ICD-10-CM | POA: Insufficient documentation

## 2023-10-10 DIAGNOSIS — D649 Anemia, unspecified: Secondary | ICD-10-CM | POA: Diagnosis not present

## 2023-10-10 DIAGNOSIS — D508 Other iron deficiency anemias: Secondary | ICD-10-CM

## 2023-10-10 LAB — CMP (CANCER CENTER ONLY)
ALT: 14 U/L (ref 0–44)
AST: 16 U/L (ref 15–41)
Albumin: 4.4 g/dL (ref 3.5–5.0)
Alkaline Phosphatase: 51 U/L (ref 38–126)
Anion gap: 6 (ref 5–15)
BUN: 17 mg/dL (ref 6–20)
CO2: 26 mmol/L (ref 22–32)
Calcium: 9.6 mg/dL (ref 8.9–10.3)
Chloride: 107 mmol/L (ref 98–111)
Creatinine: 1.31 mg/dL — ABNORMAL HIGH (ref 0.44–1.00)
GFR, Estimated: 57 mL/min — ABNORMAL LOW (ref >60)
Glucose, Bld: 103 mg/dL — ABNORMAL HIGH (ref 70–99)
Potassium: 4.9 mmol/L (ref 3.5–5.1)
Sodium: 139 mmol/L (ref 135–145)
Total Bilirubin: 0.3 mg/dL (ref 0.0–1.2)
Total Protein: 7.5 g/dL (ref 6.5–8.1)

## 2023-10-10 LAB — RETIC PANEL
Immature Retic Fract: 9.4 % (ref 2.3–15.9)
RBC.: 4.43 MIL/uL (ref 3.87–5.11)
Retic Count, Absolute: 62.9 10*3/uL (ref 19.0–186.0)
Retic Ct Pct: 1.4 % (ref 0.4–3.1)
Reticulocyte Hemoglobin: 31.3 pg (ref >27.9)

## 2023-10-10 LAB — CBC WITH DIFFERENTIAL (CANCER CENTER ONLY)
Abs Immature Granulocytes: 0.02 10*3/uL (ref 0.00–0.07)
Basophils Absolute: 0.1 10*3/uL (ref 0.0–0.1)
Basophils Relative: 1 %
Eosinophils Absolute: 0.2 10*3/uL (ref 0.0–0.5)
Eosinophils Relative: 3 %
HCT: 39.3 % (ref 36.0–46.0)
Hemoglobin: 12.3 g/dL (ref 12.0–15.0)
Immature Granulocytes: 0 %
Lymphocytes Relative: 33 %
Lymphs Abs: 2.3 10*3/uL (ref 0.7–4.0)
MCH: 27.8 pg (ref 26.0–34.0)
MCHC: 31.3 g/dL (ref 30.0–36.0)
MCV: 88.9 fL (ref 80.0–100.0)
Monocytes Absolute: 0.4 10*3/uL (ref 0.1–1.0)
Monocytes Relative: 6 %
Neutro Abs: 4 10*3/uL (ref 1.7–7.7)
Neutrophils Relative %: 57 %
Platelet Count: 390 10*3/uL (ref 150–400)
RBC: 4.42 MIL/uL (ref 3.87–5.11)
RDW: 14.7 % (ref 11.5–15.5)
WBC Count: 7.1 10*3/uL (ref 4.0–10.5)
nRBC: 0 % (ref 0.0–0.2)

## 2023-10-10 LAB — IRON AND IRON BINDING CAPACITY (CC-WL,HP ONLY)
Iron: 27 ug/dL — ABNORMAL LOW (ref 28–170)
Saturation Ratios: 7 % — ABNORMAL LOW (ref 10.4–31.8)
TIBC: 396 ug/dL (ref 250–450)
UIBC: 369 ug/dL (ref 148–442)

## 2023-10-10 LAB — FERRITIN: Ferritin: 25 ng/mL (ref 11–307)
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
## 2023-11-16 DIAGNOSIS — K2970 Gastritis, unspecified, without bleeding: Secondary | ICD-10-CM | POA: Diagnosis not present

## 2023-11-16 DIAGNOSIS — D509 Iron deficiency anemia, unspecified: Secondary | ICD-10-CM | POA: Diagnosis not present

## 2023-11-16 DIAGNOSIS — K2930 Chronic superficial gastritis without bleeding: Secondary | ICD-10-CM | POA: Diagnosis not present

## 2023-11-16 DIAGNOSIS — K921 Melena: Secondary | ICD-10-CM | POA: Diagnosis not present

## 2023-12-01 DIAGNOSIS — L218 Other seborrheic dermatitis: Secondary | ICD-10-CM | POA: Diagnosis not present

## 2024-01-23 DIAGNOSIS — Z01419 Encounter for gynecological examination (general) (routine) without abnormal findings: Secondary | ICD-10-CM | POA: Diagnosis not present

## 2024-01-23 DIAGNOSIS — Z1151 Encounter for screening for human papillomavirus (HPV): Secondary | ICD-10-CM | POA: Diagnosis not present

## 2024-01-23 DIAGNOSIS — Z113 Encounter for screening for infections with a predominantly sexual mode of transmission: Secondary | ICD-10-CM | POA: Diagnosis not present

## 2024-01-23 DIAGNOSIS — N898 Other specified noninflammatory disorders of vagina: Secondary | ICD-10-CM | POA: Diagnosis not present

## 2024-01-23 DIAGNOSIS — Z124 Encounter for screening for malignant neoplasm of cervix: Secondary | ICD-10-CM | POA: Diagnosis not present

## 2024-01-25 DIAGNOSIS — L218 Other seborrheic dermatitis: Secondary | ICD-10-CM | POA: Diagnosis not present

## 2024-02-01 DIAGNOSIS — Z30017 Encounter for initial prescription of implantable subdermal contraceptive: Secondary | ICD-10-CM | POA: Diagnosis not present

## 2024-02-01 DIAGNOSIS — Z3202 Encounter for pregnancy test, result negative: Secondary | ICD-10-CM | POA: Diagnosis not present

## 2024-02-15 DIAGNOSIS — F900 Attention-deficit hyperactivity disorder, predominantly inattentive type: Secondary | ICD-10-CM | POA: Diagnosis not present

## 2024-02-15 DIAGNOSIS — F3342 Major depressive disorder, recurrent, in full remission: Secondary | ICD-10-CM | POA: Diagnosis not present

## 2024-02-15 DIAGNOSIS — F411 Generalized anxiety disorder: Secondary | ICD-10-CM | POA: Diagnosis not present

## 2024-03-22 DIAGNOSIS — J029 Acute pharyngitis, unspecified: Secondary | ICD-10-CM | POA: Diagnosis not present

## 2024-03-22 DIAGNOSIS — J069 Acute upper respiratory infection, unspecified: Secondary | ICD-10-CM | POA: Diagnosis not present

## 2024-03-23 ENCOUNTER — Other Ambulatory Visit: Payer: Self-pay | Admitting: Medical Genetics

## 2024-06-04 DIAGNOSIS — L700 Acne vulgaris: Secondary | ICD-10-CM | POA: Diagnosis not present

## 2024-06-04 DIAGNOSIS — D2239 Melanocytic nevi of other parts of face: Secondary | ICD-10-CM | POA: Diagnosis not present

## 2024-06-22 DIAGNOSIS — R3915 Urgency of urination: Secondary | ICD-10-CM | POA: Diagnosis not present

## 2024-07-18 ENCOUNTER — Ambulatory Visit: Admitting: Orthopaedic Surgery

## 2024-07-18 ENCOUNTER — Other Ambulatory Visit (INDEPENDENT_AMBULATORY_CARE_PROVIDER_SITE_OTHER): Payer: Self-pay

## 2024-07-18 DIAGNOSIS — G8929 Other chronic pain: Secondary | ICD-10-CM

## 2024-07-18 DIAGNOSIS — M25561 Pain in right knee: Secondary | ICD-10-CM

## 2024-07-18 NOTE — Progress Notes (Signed)
 Office Visit Note   Patient: Renee Mcdowell           Date of Birth: 1995-12-21           MRN: [id number] Visit Date: 07/18/2024              Requested by: Sun, Vyvyan, MD [phone] [place],  [place] [address] PCP: Austin Nutley, MD   Assessment & Plan: Visit Diagnoses:  1. Chronic pain of right knee     Plan: History of Present Illness Renee Mcdowell is a 28 year old female who presents with right knee pain.  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 Recently fell and twisted her knee and the pain has recently intensified. It is sharp and burning with pressure at specific angles. She has not undergone an MRI or formal evaluation by a physician, only an assessment by a paramedic at the time of the injury. There is no significant locking, but stiffness occurs if the knee is held in the same position for too long. Occasional swelling is present, and the pain is localized along the medial retinaculum with a 'weird, stringy' sensation upon pressure.  Physical Exam MUSCULOSKELETAL: No effusion.  Medial joint line and medial retinaculum tenderness.  Painful clunk with McMurray maneuver at medial joint line.    Results RADIOLOGY Knee X-ray: No structural abnormalities, no evidence of arthritis, fractures, or dislocations.  Assessment and Plan Right knee pain Chronic right knee pain, likely due to medial meniscus tear or plica syndrome. X-rays normal, no effusion, McMurray positive. - Ordered MRI of the right knee to evaluate for meniscus tear or plica syndrome. - Instructed her to expect a call from [facility] imaging to schedule the MRI.  Follow-Up Instructions: No follow-ups on file.   Orders:  Orders Placed This Encounter  Procedures   XR KNEE 3 VIEW RIGHT   MR Knee Right w/o contrast   No orders of the defined types were  placed in this encounter.     Procedures: No procedures performed   Clinical Data: No additional findings.   Subjective: Chief Complaint  Patient presents with   Right Knee - Pain    HPI  Review of Systems  Constitutional: Negative.   HENT: Negative.    Eyes: Negative.   Respiratory: Negative.    Cardiovascular: Negative.   Endocrine: Negative.   Musculoskeletal: Negative.   Neurological: Negative.   Hematological: Negative.   Psychiatric/Behavioral: Negative.    All other systems reviewed and are negative.    Objective: Vital Signs: There were no vitals taken for this visit.  Physical Exam Vitals and nursing note reviewed.  Constitutional:      Appearance: She is well-developed.  HENT:     Head: Atraumatic.     Nose: Nose normal.  Eyes:     Extraocular Movements: Extraocular movements intact.  Cardiovascular:     Pulses: Normal pulses.  Pulmonary:     Effort: Pulmonary effort is normal.  Abdominal:     Palpations: Abdomen is soft.  Musculoskeletal:     Cervical back: Neck supple.  Skin:    General: Skin is warm.     Capillary Refill: Capillary refill takes less than 2 seconds.  Neurological:     Mental Status: She is alert. Mental status is at baseline.  Psychiatric:  Behavior: Behavior normal.        Thought Content: Thought content normal.        Judgment: Judgment normal.     Ortho Exam  Specialty Comments:  No specialty comments available.  Imaging: No results found.   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/26/2016   PTSD (post-traumatic stress disorder) 12/26/2016   Disorder of vitamin B12 11/21/2015   Social phobia 11/19/2015   Tobacco use disorder 11/19/2015   Alcohol use disorder, mild, abuse 11/19/2015   Attention deficit hyperactivity disorder, 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 Social History   Occupational History   Not on file  Tobacco Use   Smoking status: Former    Current packs/day: 0.10    Types: Cigarettes   Smokeless tobacco: Never  Vaping Use   Vaping status: Every Day  Substance and Sexual Activity   Alcohol use: Yes    Alcohol/week: 2.0 standard drinks of alcohol    Types: 2 Glasses of wine per week   Drug use: No   Sexual activity: Yes    Birth control/protection: Implant

## 2024-07-20 ENCOUNTER — Encounter: Payer: Self-pay | Admitting: Orthopaedic Surgery

## 2024-07-25 ENCOUNTER — Other Ambulatory Visit: Payer: Self-pay

## 2024-07-25 ENCOUNTER — Ambulatory Visit (HOSPITAL_COMMUNITY)
Admission: EM | Admit: 2024-07-25 | Discharge: 2024-07-25 | Disposition: A | Discharge status: Home / Self Care | Source: Home / Self Care

## 2024-07-25 ENCOUNTER — Encounter (HOSPITAL_COMMUNITY): Payer: Self-pay

## 2024-07-25 ENCOUNTER — Emergency Department (HOSPITAL_COMMUNITY)

## 2024-07-25 ENCOUNTER — Encounter (HOSPITAL_COMMUNITY): Payer: Self-pay | Admitting: Emergency Medicine

## 2024-07-25 ENCOUNTER — Emergency Department (HOSPITAL_COMMUNITY)
Admission: EM | Admit: 2024-07-25 | Discharge: 2024-07-26 | Disposition: A | Attending: Emergency Medicine | Admitting: Emergency Medicine

## 2024-07-25 DIAGNOSIS — R748 Abnormal levels of other serum enzymes: Secondary | ICD-10-CM | POA: Insufficient documentation

## 2024-07-25 DIAGNOSIS — K219 Gastro-esophageal reflux disease without esophagitis: Secondary | ICD-10-CM | POA: Insufficient documentation

## 2024-07-25 DIAGNOSIS — N939 Abnormal uterine and vaginal bleeding, unspecified: Secondary | ICD-10-CM | POA: Diagnosis not present

## 2024-07-25 DIAGNOSIS — R079 Chest pain, unspecified: Secondary | ICD-10-CM | POA: Diagnosis not present

## 2024-07-25 DIAGNOSIS — R0789 Other chest pain: Secondary | ICD-10-CM | POA: Diagnosis not present

## 2024-07-25 LAB — CBC
HCT: 43.2 % (ref 36.0–46.0)
Hemoglobin: 14.5 g/dL (ref 12.0–15.0)
MCH: 29.8 pg (ref 26.0–34.0)
MCHC: 33.6 g/dL (ref 30.0–36.0)
MCV: 88.7 fL (ref 80.0–100.0)
Platelets: 380 K/uL (ref 150–400)
RBC: 4.87 MIL/uL (ref 3.87–5.11)
RDW: 13.2 % (ref 11.5–15.5)
WBC: 11.3 K/uL — ABNORMAL HIGH (ref 4.0–10.5)
nRBC: 0 % (ref 0.0–0.2)

## 2024-07-25 LAB — COMPREHENSIVE METABOLIC PANEL WITH GFR
ALT: 17 U/L (ref 0–44)
AST: 19 U/L (ref 15–41)
Albumin: 4.2 g/dL (ref 3.5–5.0)
Alkaline Phosphatase: 50 U/L (ref 38–126)
Anion gap: 13 (ref 5–15)
BUN: 11 mg/dL (ref 6–20)
CO2: 23 mmol/L (ref 22–32)
Calcium: 9.4 mg/dL (ref 8.9–10.3)
Chloride: 105 mmol/L (ref 98–111)
Creatinine, Ser: 1.21 mg/dL — ABNORMAL HIGH (ref 0.44–1.00)
GFR, Estimated: >60 mL/min (ref >60)
Glucose, Bld: 92 mg/dL (ref 70–99)
Potassium: 4.4 mmol/L (ref 3.5–5.1)
Sodium: 141 mmol/L (ref 135–145)
Total Bilirubin: 0.4 mg/dL (ref 0.0–1.2)
Total Protein: 7 g/dL (ref 6.5–8.1)

## 2024-07-25 LAB — TROPONIN T, HIGH SENSITIVITY: Troponin T High Sensitivity: <15 ng/L (ref 0–19)

## 2024-07-25 LAB — POCT URINE PREGNANCY: Preg Test, Ur: NEGATIVE

## 2024-07-25 LAB — LIPASE, BLOOD: Lipase: 54 U/L — ABNORMAL HIGH (ref 11–51)

## 2024-07-25 MED ORDER — SUCRALFATE 1 G PO TABS: 1.0000 g | ORAL_TABLET | Freq: Two times a day (BID) | ORAL | 0 refills | Status: AC

## 2024-07-25 MED ORDER — FAMOTIDINE 20 MG PO TABS: 20.0000 mg | ORAL_TABLET | Freq: Two times a day (BID) | ORAL | 0 refills | Status: AC

## 2024-07-25 MED ORDER — OMEPRAZOLE 20 MG PO CPDR: 20.0000 mg | DELAYED_RELEASE_CAPSULE | Freq: Every day | ORAL | 0 refills | Status: AC

## 2024-07-25 NOTE — ED Notes (Signed)
 Per Dr Rolinda, patient ok to wait in lobby for available treatment room

## 2024-07-25 NOTE — ED Triage Notes (Signed)
 Pt reports 2 weeks ago started having bad acid reflux. Denies taking any medications to help. Reports since last night having lots of pain in chest and feeling unwell.

## 2024-07-25 NOTE — ED Provider Notes (Addendum)
 [facility]    CSN: [id number] Arrival date & time: 07/25/24  1413      History   Chief Complaint Chief Complaint  Patient presents with   Gastroesophageal Reflux   Chest Pain    HPI Renee Mcdowell is a 28 y.o. female.   Patient with 2-week history of burning chest discomfort that radiates up neck.  She is concerned for acid reflux.  She reports previous history.  Pain is intermittent and is described as a burning pain.  It is present in the center of the chest.  There have been no aggravating or relieving factors.  She states that she does not eat spicy foods.  Denies cigarette smoking.  Denies any associated shortness of breath.  She has not taken any medications for symptoms.  Reports that she was previously evaluated by GI specialist to have an endoscopy and colonoscopy but she was not having any acid reflux symptoms at that time.  Reports endoscopy and colonoscopy were normal. Denies any previous cardiac or pulmonary issues.  Pertinent medical history includes ADHD, anxiety, depression.  Patient reports vaginal bleeding for 2 weeks which is almost complete.  She is attributing this to Nexplanon .  She is not reporting any urinary symptoms or new vaginal discharge.  She is sexually active.   Gastroesophageal Reflux  Chest Pain   Past Medical History:  Diagnosis Date   ADHD (attention deficit hyperactivity disorder)    Anxiety    Depression    Major depressive disorder, recurrent episode, moderate (HCC)    Nicotine  abuse    Obesity (BMI 30.0-34.9)    Psychiatric inpatient    Social phobia    Suicidal ideations     Patient Active Problem List   Diagnosis Date Noted   Thrombocytosis 08/25/2023   Anemia 08/25/2023   Bacteremia 08/25/2023   Prolonged QT interval 08/25/2023   Pyelonephritis 08/24/2023   Metabolic acidosis 08/14/2023   Severe sepsis (HCC) 08/14/2023   E coli bacteremia 08/14/2023   Depression with anxiety 08/09/2023   AKI (acute kidney  injury) 08/09/2023   Thrombocytopenia 08/09/2023   Pyelonephritis of right kidney 08/08/2023   Major depressive disorder, 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 No pertinent surgical history.  OB History   No obstetric history on file.      Home Medications    Prior to Admission medications   Medication Sig Start Date End Date Taking? Authorizing Provider  famotidine  (PEPCID ) 20 MG tablet Take 1 tablet (20 mg total) by mouth 2 (two) times daily. 07/25/24  Yes Zarie Kosiba, Darryle E, FNP  guanFACINE (INTUNIV) 1 MG TB24 ER tablet Take 1 mg by mouth every morning. 07/12/24  Yes [provider]  omeprazole (PRILOSEC) 20 MG capsule Take 1 capsule (20 mg total) by mouth daily. 07/25/24  Yes Zynasia Burklow, Darryle E, FNP  sucralfate (CARAFATE) 1 g tablet Take 1 tablet (1 g total) by mouth 2 (two) times daily. 07/25/24  Yes Kennen Stammer, Darryle E, FNP  amphetamine-dextroamphetamine (ADDERALL XR) 20 MG 24 hr capsule Take 20 mg by mouth daily.    [provider]  amphetamine-dextroamphetamine (ADDERALL) 20 MG tablet Take 20 mg by mouth 2 (two) times daily.    [provider]  atomoxetine  (STRATTERA ) 100 MG capsule Take 10 mg by mouth daily.    [provider]  diphenhydrAMINE  (BENADRYL ) 25 mg capsule Take 1 capsule (25 mg total) by mouth every 6 (six) hours as needed for itching. 08/27/23   Regalado, Belkys A, MD  hydrocerin (EUCERIN) CREA Apply 1 Application topically 2 (two) times daily. 08/27/23   Regalado, Belkys A, MD  iron  polysaccharides (NIFEREX) 150 MG capsule Take 1 capsule (150 mg total) by mouth daily. 08/28/23   Regalado, Belkys A, MD  TRINTELLIX  20 MG  TABS tablet Take 20 mg by mouth daily. 10/24/19   [provider]    Family History Family History  Problem Relation Age of Onset   Mental illness Father        Schizoaffective disorder   Thyroid  disease Mother    Valvular heart disease Mother     Social History Social History   Tobacco Use   Smoking status: Former    Current packs/day: 0.10    Types: Cigarettes   Smokeless tobacco: Never  Vaping Use   Vaping status: Every Day  Substance Use Topics   Alcohol use: Yes    Alcohol/week: 2.0 standard drinks of alcohol    Types: 2 Glasses of wine per week   Drug use: No     Allergies   Ancef  [cefazolin ] and Azithromycin   Review of Systems Review of Systems Per HPI  Physical Exam Triage Vital Signs ED Triage Vitals  Encounter Vitals Group     BP 07/25/24 1424 124/75     Girls Systolic BP Percentile --      Girls Diastolic BP Percentile --      Boys Systolic BP Percentile --      Boys Diastolic BP Percentile --      Pulse Rate 07/25/24 1424 84     Resp 07/25/24 1424 15     Temp 07/25/24 1424 98.5 F (36.9 C)     Temp Source 07/25/24 1424 Oral     SpO2 07/25/24 1424 100 %     Weight --      Height --      Head Circumference --      Peak Flow --      Pain Score 07/25/24 1421 4     Pain Loc --      Pain Education --      Exclude from Growth Chart --    No data found.  Updated Vital Signs BP 124/75 (BP Location: Right Arm)   Pulse 84   Temp 98.5 F (36.9 C) (Oral)   Resp 15   LMP 07/10/2024 (Approximate)   SpO2 100%   Visual Acuity Right Eye Distance:   Left Eye Distance:   Bilateral Distance:    Right Eye Near:   Left Eye Near:    Bilateral Near:     Physical Exam Constitutional:      General: She is not in acute distress.    Appearance: Normal appearance. She is not toxic-appearing or diaphoretic.  HENT:     Head: Normocephalic and atraumatic.  Eyes:     Extraocular Movements: Extraocular movements intact.      Conjunctiva/sclera: Conjunctivae normal.  Cardiovascular:     Rate and Rhythm: Normal rate and regular rhythm.     Pulses: Normal pulses.     Heart sounds: Normal heart sounds.  Pulmonary:     Effort: Pulmonary effort is normal. No respiratory distress.     Breath sounds: Normal breath sounds. No  stridor. No wheezing, rhonchi or rales.  Neurological:     General: No focal deficit present.     Mental Status: She is alert and oriented to person, place, and time. Mental status is at baseline.  Psychiatric:        Mood and Affect: Mood normal.        Behavior: Behavior normal.        Thought Content: Thought content normal.        Judgment: Judgment normal.      UC Treatments / Results  Labs (all labs ordered are listed, but only abnormal results are displayed) Labs Reviewed  CBC - Abnormal; Notable for the following components:      Result Value   WBC 11.3 (*)    All other components within normal limits  COMPREHENSIVE METABOLIC PANEL WITH GFR  POCT URINE PREGNANCY    EKG   Radiology No results found.  Procedures Procedures (including critical care time)  Medications Ordered in UC Medications - No data to display  Initial Impression / Assessment and Plan / UC Course  I have reviewed the triage vital signs and the nursing notes.  Pertinent labs & imaging results that were available during my care of the patient were reviewed by me and considered in my medical decision making (see chart for details).     EKG completed which was unremarkable.  Given normal physical exam and description of patient's symptoms, I am concerned for GERD versus stomach ulcer.  Will treat with Pepcid , PPI, Carafate  to see if this will be helpful.  She denies that she takes any of these medications daily.  Advised patient to follow-up with PCP if symptoms persist.  She was advised of strict ER precautions if symptoms persist or worsen.  HEAR score is 0 so I do not have any concern for need for ER  evaluation at this time.  Also advised to eat a bland diet, restrict eating within 1 hour of bedtime, and sleep upright.  Urine pregnancy test completed given patient reports 2-week history of vaginal bleeding.  This was negative.  Suspect this is due to Nexplanon  placement.  Encouraged follow-up with gynecologist for this.  Given no vaginal discharge or urinary symptoms, vaginal swab and UA were deferred. CMP and CBC pending. Patient verbalized understanding and was agreeable with plan.  Crcl 74 so no dosage adjustment necessary for medications.  Final Clinical Impressions(s) / UC Diagnoses   Final diagnoses:  Abnormal vaginal bleeding  Gastroesophageal reflux disease, unspecified whether esophagitis present     Discharge Instructions      Your pregnancy test was negative.  I have prescribed you 3 medications to help with symptoms.  If symptoms persist or worsen, please go to the ER.  Follow-up with PCP.    ED Prescriptions     Medication Sig Dispense Auth. Provider   sucralfate  (CARAFATE ) 1 g tablet Take 1 tablet (1 g total) by mouth 2 (two) times daily. 60 tablet [place], Slinger E, FNP   omeprazole  (PRILOSEC) 20 MG capsule Take 1 capsule (20 mg total) by mouth daily. 30 capsule [place], [place] E, FNP   famotidine  (PEPCID ) 20 MG tablet Take 1 tablet (20 mg total) by mouth 2 (two) times daily. 60 tablet [place], Darryle BRAVO, [place]      PDMP not reviewed this encounter.   Hazen Darryle BRAVO, [place] 07/25/24 1659    Hazen Darryle BRAVO, [place] 07/25/24 1700    Hazen Darryle BRAVO, [place] 07/25/24 1701  Hazen Darryle BRAVO, [place] 07/25/24 1850

## 2024-07-25 NOTE — ED Triage Notes (Addendum)
 Pt reports burning in her chest x 3 weeks, but worsened over today associated with RLQ abd pain and tightness in her chest. She went to Urgent Care. She reports she checked her mychart and her WBC and Creatinine were elevated. She reports last time she felt this bad she was hospitalized for sepsis/pyelonephritis.

## 2024-07-25 NOTE — Discharge Instructions (Signed)
 Your pregnancy test was negative.  I have prescribed you 3 medications to help with symptoms.  If symptoms persist or worsen, please go to the ER.  Follow-up with PCP.

## 2024-07-26 ENCOUNTER — Ambulatory Visit: Payer: Self-pay | Admitting: Internal Medicine

## 2024-07-26 LAB — TROPONIN T, HIGH SENSITIVITY: Troponin T High Sensitivity: <15 ng/L (ref 0–19)

## 2024-07-26 MED ORDER — FAMOTIDINE IN NACL 20-0.9 MG/50ML-% IV SOLN
20.0000 mg | Freq: Once | INTRAVENOUS | Status: AC
  Administered 2024-07-26: 20 mg via INTRAVENOUS
  Filled 2024-07-26: qty 50

## 2024-07-26 NOTE — ED Notes (Signed)
 Patient d/c with home care instructions with visitor at bedside. IV discontinued.

## 2024-07-26 NOTE — Discharge Instructions (Addendum)
 Please take the medications prescribed by urgent care earlier today.  Follow-up with your primary care provider for further evaluation of your reflux as needed.  Return to the emergency department if you develop any life-threatening symptoms

## 2024-07-26 NOTE — Medical Student Note (Incomplete)
 [facility] Provider Student Note For educational purposes for Medical, PA and NP students only and not part of the legal medical record.   CSN: [id number] Arrival date & time: 07/25/24  2031      History   Chief Complaint Chief Complaint  Patient presents with   Chest Pain    HPI Renee Mcdowell is a 28 y.o. female.  28 year old female with a PMH of acute pyelonephritis, reflux, and gastritis on EGD 2025. She states she has been having a flare of acid reflux for the past 1-2 weeks with associated feeling of chest tightness but denies chest pain or palpitations. She was evalutated at urgent care and became worried about her lab findings of WBC 11K due to history of pyelonephritis. She denies fever, nausea/vomiting, urinary retention, increased frequency, dysuria, hematuria, and suprapubic pain. She reports staying somewhat hydrated and drinks mostly tea. She does not report routinely taking PPIs.    Chest Pain   Past Medical History:  Diagnosis Date   ADHD (attention deficit hyperactivity disorder)    Anxiety    Depression    Major depressive disorder, recurrent episode, moderate (HCC)    Nicotine  abuse    Obesity (BMI 30.0-34.9)    Psychiatric inpatient    Social phobia    Suicidal ideations     Patient Active Problem List   Diagnosis Date Noted   Thrombocytosis 08/25/2023   Anemia 08/25/2023   Bacteremia 08/25/2023   Prolonged QT interval 08/25/2023   Pyelonephritis 08/24/2023   Metabolic acidosis 08/14/2023   Severe sepsis (HCC) 08/14/2023   E coli bacteremia 08/14/2023   Depression with anxiety 08/09/2023   AKI (acute kidney injury) 08/09/2023   Thrombocytopenia 08/09/2023   Pyelonephritis of right kidney 08/08/2023   Major depressive disorder, 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 No pertinent surgical history.  OB History   No obstetric history on file.      Home Medications    Prior to Admission medications   Medication Sig Start Date End Date Taking? Authorizing Provider  amphetamine-dextroamphetamine (ADDERALL XR) 20 MG 24 hr capsule Take 20 mg by mouth daily.    [provider]  amphetamine-dextroamphetamine (ADDERALL) 20 MG tablet Take 20 mg by mouth 2 (two) times daily.    [provider]  atomoxetine  (STRATTERA ) 100 MG capsule Take 10 mg by mouth daily.    [provider]  diphenhydrAMINE  (BENADRYL ) 25 mg capsule Take 1 capsule (25 mg total) by mouth every 6 (six) hours as needed for itching. 08/27/23   Regalado, Belkys A, MD  famotidine  (PEPCID ) 20 MG tablet Take 1 tablet (20 mg total) by mouth 2 (two) times daily. 07/25/24   Hazen Darryle BRAVO, FNP  guanFACINE (INTUNIV) 1 MG TB24 ER tablet Take 1 mg by mouth every morning. 07/12/24   [provider]  hydrocerin (EUCERIN) CREA Apply 1 Application topically 2 (two) times daily. 08/27/23   Regalado, Belkys A, MD  iron  polysaccharides (NIFEREX) 150 MG capsule Take 1 capsule (150 mg total) by mouth daily. 08/28/23   Regalado, Belkys A, MD  omeprazole (PRILOSEC) 20 MG capsule Take 1 capsule (20 mg total) by mouth daily. 07/25/24   Hazen Darryle BRAVO,  FNP  sucralfate (CARAFATE) 1 g tablet Take 1 tablet (1 g total) by mouth 2 (two) times daily. 07/25/24   Hazen Darryle BRAVO, FNP  TRINTELLIX  20 MG TABS tablet Take 20 mg by mouth daily. 10/24/19   [provider]    Family History Family History  Problem Relation Age of Onset   Mental illness Father        Schizoaffective disorder   Thyroid  disease Mother    Valvular heart disease Mother     Social History Social History   Tobacco Use   Smoking status:  Former    Current packs/day: 0.10    Types: Cigarettes   Smokeless tobacco: Never  Vaping Use   Vaping status: Every Day  Substance Use Topics   Alcohol use: Yes    Alcohol/week: 2.0 standard drinks of alcohol    Types: 2 Glasses of wine per week   Drug use: No     Allergies   Ancef  [cefazolin ] and Azithromycin   Review of Systems Review of Systems  Cardiovascular:  Positive for chest pain.     Physical Exam Updated Vital Signs BP (!) 142/87   Pulse (!) 102   Temp 98.3 F (36.8 C) (Oral)   Resp 16   Ht 5' 5 (1.651 m)   Wt 65.8 kg   LMP 07/11/2024 (Approximate)   SpO2 100%   BMI 24.13 kg/m   Physical Exam   ED Treatments / Results  Labs (all labs ordered are listed, but only abnormal results are displayed) Labs Reviewed  LIPASE, BLOOD - Abnormal; Notable for the following components:      Result Value   Lipase 54 (*)    All other components within normal limits  URINALYSIS, ROUTINE W REFLEX MICROSCOPIC  TROPONIN T, HIGH SENSITIVITY  TROPONIN T, HIGH SENSITIVITY    EKG  Radiology DG Chest 2 View Result Date: 07/25/2024 EXAM: 2 VIEW(S) XRAY OF THE CHEST 07/25/2024 09:04:35 PM COMPARISON: 08/24/2023 CLINICAL HISTORY: cp FINDINGS: LUNGS AND PLEURA: No focal pulmonary opacity. No pleural effusion. No pneumothorax. HEART AND MEDIASTINUM: No acute abnormality of the cardiac and mediastinal silhouettes. BONES AND SOFT TISSUES: No acute osseous abnormality. IMPRESSION: 1. No acute cardiopulmonary process. Electronically signed by: Morgane Naveau MD 07/25/2024 09:32 PM EST RP Workstation: [id number]    Procedures Procedures (including critical care time)  Medications Ordered in ED Medications - No data to display   Initial Impression / Assessment and Plan / ED Course  I have reviewed the triage vital signs and the nursing notes.  Pertinent labs & imaging results that were available during my care of the patient were reviewed by me and considered in my  medical decision making (see chart for details).     ***  Final Clinical Impressions(s) / ED Diagnoses   Final diagnoses:  None    New Prescriptions New Prescriptions   No medications on file

## 2024-07-26 NOTE — ED Provider Notes (Signed)
 [place] EM[facility]PARTMENT AT [facility] Provider Note   CSN: [id number] Arrival date & time: 07/25/24  2031     Patient presents with: Chest Pain   Renee Mcdowell is a 28 y.o. female.  28 year old female with a PMH of acute pyelonephritis, reflux, and gastritis on EGD 2025. She states she has been having a flare of acid reflux for the past 1-2 weeks with associated feeling of chest tightness but denies chest pain or palpitations. She was evalutated at urgent care and became worried about her lab findings of WBC 11K due to history of pyelonephritis. She denies fever, nausea/vomiting, urinary retention, increased frequency, dysuria, hematuria, and suprapubic pain. She reports staying somewhat hydrated and drinks mostly tea. She does not report routinely taking PPIs.     Chest Pain      Prior to Admission medications   Medication Sig Start Date End Date Taking? Authorizing Provider  amphetamine-dextroamphetamine (ADDERALL XR) 20 MG 24 hr capsule Take 20 mg by mouth daily.    [provider]  amphetamine-dextroamphetamine (ADDERALL) 20 MG tablet Take 20 mg by mouth 2 (two) times daily.    [provider]  atomoxetine  (STRATTERA ) 100 MG capsule Take 10 mg by mouth daily.    [provider]  diphenhydrAMINE  (BENADRYL ) 25 mg capsule Take 1 capsule (25 mg total) by mouth every 6 (six) hours as needed for itching. 08/27/23   Regalado, Belkys A, MD  famotidine  (PEPCID ) 20 MG tablet Take 1 tablet (20 mg total) by mouth 2 (two) times daily. 07/25/24   Hazen Darryle BRAVO, FNP  guanFACINE (INTUNIV) 1 MG TB24 ER tablet Take 1 mg by mouth every morning. 07/12/24   [provider]  hydrocerin (EUCERIN) CREA Apply 1 Application topically 2 (two) times daily. 08/27/23   Regalado, Belkys A, MD  iron  polysaccharides (NIFEREX) 150 MG capsule Take 1 capsule (150 mg total) by mouth daily. 08/28/23   Regalado, Belkys A, MD  omeprazole  (PRILOSEC) 20 MG  capsule Take 1 capsule (20 mg total) by mouth daily. 07/25/24   Hazen Darryle BRAVO, FNP  sucralfate  (CARAFATE ) 1 g tablet Take 1 tablet (1 g total) by mouth 2 (two) times daily. 07/25/24   Hazen Darryle BRAVO, FNP  TRINTELLIX  20 MG TABS tablet Take 20 mg by mouth daily. 10/24/19   [provider]    Allergies: Ancef  [cefazolin ] and Azithromycin    Review of Systems  Cardiovascular:  Positive for chest pain.    Updated Vital Signs BP (!) 129/91   Pulse 79   Temp 98 F (36.7 C)   Resp 15   Ht 5' 5 (1.651 m)   Wt 65.8 kg   LMP 07/11/2024 (Approximate)   SpO2 100%   BMI 24.13 kg/m   Physical Exam Vitals and nursing note reviewed.  Constitutional:      General: She is not in acute distress.    Appearance: She is well-developed.  HENT:     Head: Normocephalic and atraumatic.  Eyes:     Conjunctiva/sclera: Conjunctivae normal.  Cardiovascular:     Rate and Rhythm: Normal rate and regular rhythm.     Heart sounds: No murmur heard. Pulmonary:     Effort: Pulmonary effort is normal. No respiratory distress.     Breath sounds: Normal breath sounds.  Chest:     Chest wall: No tenderness.  Abdominal:     Palpations: Abdomen is soft.     Tenderness: There is no abdominal tenderness.  Musculoskeletal:        General: No swelling.     Cervical back: Neck supple.  Skin:    General: Skin is warm and dry.     Capillary Refill: Capillary refill takes less than 2 seconds.  Neurological:     Mental Status: She is alert.  Psychiatric:        Mood and Affect: Mood normal.     (all labs ordered are listed, but only abnormal results are displayed) Labs Reviewed  LIPASE, BLOOD - Abnormal; Notable for the following components:      Result Value   Lipase 54 (*)    All other components within normal limits  URINALYSIS, ROUTINE W REFLEX MICROSCOPIC  TROPONIN T, HIGH SENSITIVITY  TROPONIN T, HIGH SENSITIVITY    EKG: EKG Interpretation Date/Time:  Wednesday July 25 2024  20:42:44 EST Ventricular Rate:  94 PR Interval:  110 QRS Duration:  83 QT Interval:  348 QTC Calculation: 436 R Axis:   78  Text Interpretation: Sinus rhythm Borderline short PR interval Confirmed by Midge Golas ([id number]) on 07/25/2024 11:25:03 PM  Radiology: [facility] Chest 2 View Result Date: 07/25/2024 EXAM: 2 VIEW(S) XRAY OF THE CHEST 07/25/2024 09:04:35 PM COMPARISON: 08/24/2023 CLINICAL HISTORY: cp FINDINGS: LUNGS AND PLEURA: No focal pulmonary opacity. No pleural effusion. No pneumothorax. HEART AND MEDIASTINUM: No acute abnormality of the cardiac and mediastinal silhouettes. BONES AND SOFT TISSUES: No acute osseous abnormality. IMPRESSION: 1. No acute cardiopulmonary process. Electronically signed by: Morgane Naveau MD 07/25/2024 09:32 PM EST RP Workstation: [id number]     Procedures   Medications Ordered in the ED  famotidine  (PEPCID ) IVPB 20 mg premix (20 mg Intravenous New Bag/Given 07/26/24 0128)                                    Medical Decision Making Amount and/or Complexity of Data Reviewed Labs: ordered. Radiology: ordered.   This patient presents to the ED for concern of epigastric pain and abnormal labs, this involves an extensive number of treatment options, and is a complaint that carries with it a high risk of complications and morbidity.  The differential diagnosis includes GERD, esophagitis, ACS, others   Co morbidities / Chronic conditions that complicate the patient evaluation  History of esophagitis, pyelonephritis   Additional history obtained:  Additional history obtained from EMR External records from outside source obtained and reviewed including urgent care note   Lab Tests:  I Ordered, and personally interpreted labs.  The pertinent results include: Mildly elevated lipase at 54, troponin less than 15   Imaging Studies ordered:  Patient with no abdominal tenderness.  No sign of surgical/acute abdomen.  No indication for emergent  abdominal imaging.  I did order and interpret a chest x-ray which showed no acute findings   Cardiac Monitoring: / EKG:  The patient was maintained on a cardiac monitor.  I personally viewed and interpreted the cardiac monitored which showed an underlying rhythm of: Sinus rhythm   Problem List / ED Course / Critical interventions / Medication management   I ordered medication including Pepcid  Reevaluation of the patient after these medicines showed that the patient improved I have reviewed the patients home medicines and have made adjustments as needed   Social Determinants of Health:  Patient is a former smoker   Test / Admission - Considered:  Patient with negative troponin and nonischemic EKG.  No sign of ACS.  Unremarkable chest x-ray.  Patient seems to be mostly concerned about her abnormal labs from earlier today.  She had a mildly elevated creatinine at approximately 1.2 and a white count of just over 11,000 as drawn by urgent care.  She has no urinary symptoms, no flank pain.  There are no signs at this time of sepsis.  I reassured the patient about these lab values and explained that the mildly elevated white count is nonspecific.  I do not see an indication for emergent abdominal imaging at this time.  The patient does have a history of esophagitis and has not been taking any medications as an outpatient.  She was prescribed a PPI and Pepcid  at the urgent care earlier today.  I treated her to this evening with a dose of IV Pepcid .  She does feel better now.  I plan to have patient follow-up with her primary care provider for further evaluation and management as needed.  There is no indication for further emergent workup or admission at this time.  Patient stable for discharge home      Final diagnoses:  Gastroesophageal reflux disease, unspecified whether esophagitis present    ED Discharge Orders     None          Logan Ubaldo KATHEE DEVONNA 07/26/24 0159     Midge Golas, MD 07/26/24 [phone]

## 2024-08-06 ENCOUNTER — Ambulatory Visit
Admission: RE | Admit: 2024-08-06 | Discharge: 2024-08-06 | Disposition: A | Source: Ambulatory Visit | Attending: Orthopaedic Surgery | Admitting: Orthopaedic Surgery

## 2024-08-06 DIAGNOSIS — R600 Localized edema: Secondary | ICD-10-CM | POA: Diagnosis not present

## 2024-08-06 DIAGNOSIS — M25569 Pain in unspecified knee: Secondary | ICD-10-CM | POA: Diagnosis not present

## 2024-08-06 DIAGNOSIS — M25469 Effusion, unspecified knee: Secondary | ICD-10-CM | POA: Diagnosis not present

## 2024-08-06 DIAGNOSIS — G8929 Other chronic pain: Secondary | ICD-10-CM

## 2024-08-06 DIAGNOSIS — M25579 Pain in unspecified ankle and joints of unspecified foot: Secondary | ICD-10-CM | POA: Diagnosis not present

## 2024-08-08 ENCOUNTER — Other Ambulatory Visit

## 2024-08-09 DIAGNOSIS — N939 Abnormal uterine and vaginal bleeding, unspecified: Secondary | ICD-10-CM | POA: Diagnosis not present

## 2024-08-09 DIAGNOSIS — Z7251 High risk heterosexual behavior: Secondary | ICD-10-CM | POA: Diagnosis not present

## 2024-08-16 ENCOUNTER — Ambulatory Visit: Admitting: Orthopaedic Surgery

## 2024-10-08 ENCOUNTER — Other Ambulatory Visit: Payer: Self-pay | Admitting: Medical Genetics

## 2024-10-08 DIAGNOSIS — Z006 Encounter for examination for normal comparison and control in clinical research program: Secondary | ICD-10-CM

## 2024-10-16 ENCOUNTER — Ambulatory Visit: Admitting: Orthopaedic Surgery
